# Patient Record
Sex: Female | Born: 1935 | Race: White | Hispanic: No | Smoking: Never smoker
Health system: Southern US, Community
[De-identification: ages and names within clinical notes are randomized; demographics above are authoritative.]

## PROBLEM LIST (undated history)

## (undated) DIAGNOSIS — I6529 Occlusion and stenosis of unspecified carotid artery: Secondary | ICD-10-CM

## (undated) DIAGNOSIS — D649 Anemia, unspecified: Secondary | ICD-10-CM

## (undated) DIAGNOSIS — F32A Depression, unspecified: Secondary | ICD-10-CM

## (undated) DIAGNOSIS — G20 Parkinson's disease: Secondary | ICD-10-CM

## (undated) DIAGNOSIS — F329 Major depressive disorder, single episode, unspecified: Secondary | ICD-10-CM

## (undated) DIAGNOSIS — E041 Nontoxic single thyroid nodule: Secondary | ICD-10-CM

## (undated) DIAGNOSIS — E785 Hyperlipidemia, unspecified: Secondary | ICD-10-CM

## (undated) DIAGNOSIS — E119 Type 2 diabetes mellitus without complications: Secondary | ICD-10-CM

## (undated) DIAGNOSIS — K219 Gastro-esophageal reflux disease without esophagitis: Secondary | ICD-10-CM

## (undated) DIAGNOSIS — I10 Essential (primary) hypertension: Secondary | ICD-10-CM

## (undated) DIAGNOSIS — F0390 Unspecified dementia without behavioral disturbance: Secondary | ICD-10-CM

## (undated) HISTORY — PX: PARATHYROIDECTOMY: SHX19

## (undated) HISTORY — PX: APPENDECTOMY: SHX54

## (undated) HISTORY — PX: ABDOMINAL HYSTERECTOMY: SHX81

## (undated) HISTORY — PX: JOINT REPLACEMENT: SHX530

## (undated) HISTORY — PX: CAROTID ENDARTERECTOMY: SUR193

---

## 2006-01-15 ENCOUNTER — Other Ambulatory Visit: Payer: Self-pay

## 2006-01-15 ENCOUNTER — Emergency Department: Payer: Self-pay | Admitting: Emergency Medicine

## 2008-07-25 ENCOUNTER — Ambulatory Visit: Payer: Self-pay | Admitting: Internal Medicine

## 2008-10-02 ENCOUNTER — Ambulatory Visit: Payer: Self-pay | Admitting: Vascular Surgery

## 2008-10-30 ENCOUNTER — Ambulatory Visit: Payer: Self-pay | Admitting: Vascular Surgery

## 2008-11-06 ENCOUNTER — Inpatient Hospital Stay: Payer: Self-pay | Admitting: Vascular Surgery

## 2008-11-11 ENCOUNTER — Emergency Department: Payer: Self-pay | Admitting: Emergency Medicine

## 2009-03-24 ENCOUNTER — Ambulatory Visit: Payer: Self-pay | Admitting: Unknown Physician Specialty

## 2009-08-20 ENCOUNTER — Ambulatory Visit: Payer: Self-pay | Admitting: Internal Medicine

## 2009-09-24 ENCOUNTER — Ambulatory Visit: Payer: Self-pay | Admitting: Internal Medicine

## 2009-10-05 ENCOUNTER — Ambulatory Visit: Payer: Self-pay | Admitting: Anesthesiology

## 2009-12-17 ENCOUNTER — Ambulatory Visit: Payer: Self-pay | Admitting: Internal Medicine

## 2010-01-14 ENCOUNTER — Ambulatory Visit: Payer: Self-pay | Admitting: Unknown Physician Specialty

## 2010-01-29 ENCOUNTER — Ambulatory Visit: Payer: Self-pay | Admitting: Internal Medicine

## 2010-05-11 ENCOUNTER — Other Ambulatory Visit: Payer: Self-pay | Admitting: Internal Medicine

## 2010-12-03 ENCOUNTER — Ambulatory Visit: Payer: Self-pay

## 2011-04-16 DIAGNOSIS — R269 Unspecified abnormalities of gait and mobility: Secondary | ICD-10-CM | POA: Diagnosis not present

## 2011-04-16 DIAGNOSIS — Z9181 History of falling: Secondary | ICD-10-CM | POA: Diagnosis not present

## 2011-04-16 DIAGNOSIS — R279 Unspecified lack of coordination: Secondary | ICD-10-CM | POA: Diagnosis not present

## 2011-04-16 DIAGNOSIS — M6281 Muscle weakness (generalized): Secondary | ICD-10-CM | POA: Diagnosis not present

## 2011-04-16 DIAGNOSIS — M2550 Pain in unspecified joint: Secondary | ICD-10-CM | POA: Diagnosis not present

## 2011-04-19 DIAGNOSIS — R279 Unspecified lack of coordination: Secondary | ICD-10-CM | POA: Diagnosis not present

## 2011-04-19 DIAGNOSIS — M6281 Muscle weakness (generalized): Secondary | ICD-10-CM | POA: Diagnosis not present

## 2011-04-19 DIAGNOSIS — Z9181 History of falling: Secondary | ICD-10-CM | POA: Diagnosis not present

## 2011-04-19 DIAGNOSIS — M2550 Pain in unspecified joint: Secondary | ICD-10-CM | POA: Diagnosis not present

## 2011-04-19 DIAGNOSIS — R269 Unspecified abnormalities of gait and mobility: Secondary | ICD-10-CM | POA: Diagnosis not present

## 2011-04-20 DIAGNOSIS — N343 Urethral syndrome, unspecified: Secondary | ICD-10-CM | POA: Diagnosis not present

## 2011-04-20 DIAGNOSIS — R339 Retention of urine, unspecified: Secondary | ICD-10-CM | POA: Diagnosis not present

## 2011-04-20 DIAGNOSIS — R338 Other retention of urine: Secondary | ICD-10-CM | POA: Diagnosis not present

## 2011-04-22 ENCOUNTER — Ambulatory Visit: Payer: Self-pay | Admitting: Surgery

## 2011-04-22 DIAGNOSIS — I119 Hypertensive heart disease without heart failure: Secondary | ICD-10-CM | POA: Diagnosis not present

## 2011-04-22 DIAGNOSIS — Z01810 Encounter for preprocedural cardiovascular examination: Secondary | ICD-10-CM | POA: Diagnosis not present

## 2011-04-22 DIAGNOSIS — E559 Vitamin D deficiency, unspecified: Secondary | ICD-10-CM | POA: Diagnosis not present

## 2011-04-22 DIAGNOSIS — E119 Type 2 diabetes mellitus without complications: Secondary | ICD-10-CM | POA: Diagnosis not present

## 2011-04-22 DIAGNOSIS — Z79899 Other long term (current) drug therapy: Secondary | ICD-10-CM | POA: Diagnosis not present

## 2011-04-22 DIAGNOSIS — Z7982 Long term (current) use of aspirin: Secondary | ICD-10-CM | POA: Diagnosis not present

## 2011-04-22 DIAGNOSIS — I739 Peripheral vascular disease, unspecified: Secondary | ICD-10-CM | POA: Diagnosis not present

## 2011-04-22 DIAGNOSIS — Z01812 Encounter for preprocedural laboratory examination: Secondary | ICD-10-CM | POA: Diagnosis not present

## 2011-04-22 LAB — HEMOGLOBIN: HGB: 11 g/dL — ABNORMAL LOW (ref 12.0–16.0)

## 2011-04-22 LAB — BASIC METABOLIC PANEL
Calcium, Total: 10.4 mg/dL — ABNORMAL HIGH (ref 8.5–10.1)
Co2: 28 mmol/L (ref 21–32)
Creatinine: 0.75 mg/dL (ref 0.60–1.30)
EGFR (African American): >60
Sodium: 141 mmol/L (ref 136–145)

## 2011-04-23 DIAGNOSIS — M2550 Pain in unspecified joint: Secondary | ICD-10-CM | POA: Diagnosis not present

## 2011-04-23 DIAGNOSIS — M6281 Muscle weakness (generalized): Secondary | ICD-10-CM | POA: Diagnosis not present

## 2011-04-23 DIAGNOSIS — R279 Unspecified lack of coordination: Secondary | ICD-10-CM | POA: Diagnosis not present

## 2011-04-23 DIAGNOSIS — I6529 Occlusion and stenosis of unspecified carotid artery: Secondary | ICD-10-CM | POA: Diagnosis not present

## 2011-04-23 DIAGNOSIS — Z9181 History of falling: Secondary | ICD-10-CM | POA: Diagnosis not present

## 2011-04-23 DIAGNOSIS — R269 Unspecified abnormalities of gait and mobility: Secondary | ICD-10-CM | POA: Diagnosis not present

## 2011-04-26 DIAGNOSIS — Z9181 History of falling: Secondary | ICD-10-CM | POA: Diagnosis not present

## 2011-04-26 DIAGNOSIS — M6281 Muscle weakness (generalized): Secondary | ICD-10-CM | POA: Diagnosis not present

## 2011-04-26 DIAGNOSIS — R279 Unspecified lack of coordination: Secondary | ICD-10-CM | POA: Diagnosis not present

## 2011-04-26 DIAGNOSIS — M2550 Pain in unspecified joint: Secondary | ICD-10-CM | POA: Diagnosis not present

## 2011-04-26 DIAGNOSIS — R269 Unspecified abnormalities of gait and mobility: Secondary | ICD-10-CM | POA: Diagnosis not present

## 2011-04-28 DIAGNOSIS — M2550 Pain in unspecified joint: Secondary | ICD-10-CM | POA: Diagnosis not present

## 2011-04-28 DIAGNOSIS — R269 Unspecified abnormalities of gait and mobility: Secondary | ICD-10-CM | POA: Diagnosis not present

## 2011-04-28 DIAGNOSIS — R279 Unspecified lack of coordination: Secondary | ICD-10-CM | POA: Diagnosis not present

## 2011-04-28 DIAGNOSIS — M6281 Muscle weakness (generalized): Secondary | ICD-10-CM | POA: Diagnosis not present

## 2011-04-28 DIAGNOSIS — Z9181 History of falling: Secondary | ICD-10-CM | POA: Diagnosis not present

## 2011-05-04 DIAGNOSIS — M6281 Muscle weakness (generalized): Secondary | ICD-10-CM | POA: Diagnosis not present

## 2011-05-04 DIAGNOSIS — Z9181 History of falling: Secondary | ICD-10-CM | POA: Diagnosis not present

## 2011-05-04 DIAGNOSIS — R279 Unspecified lack of coordination: Secondary | ICD-10-CM | POA: Diagnosis not present

## 2011-05-04 DIAGNOSIS — M2550 Pain in unspecified joint: Secondary | ICD-10-CM | POA: Diagnosis not present

## 2011-05-04 DIAGNOSIS — R269 Unspecified abnormalities of gait and mobility: Secondary | ICD-10-CM | POA: Diagnosis not present

## 2011-05-06 DIAGNOSIS — Z9181 History of falling: Secondary | ICD-10-CM | POA: Diagnosis not present

## 2011-05-06 DIAGNOSIS — R279 Unspecified lack of coordination: Secondary | ICD-10-CM | POA: Diagnosis not present

## 2011-05-06 DIAGNOSIS — M6281 Muscle weakness (generalized): Secondary | ICD-10-CM | POA: Diagnosis not present

## 2011-05-06 DIAGNOSIS — R269 Unspecified abnormalities of gait and mobility: Secondary | ICD-10-CM | POA: Diagnosis not present

## 2011-05-06 DIAGNOSIS — M2550 Pain in unspecified joint: Secondary | ICD-10-CM | POA: Diagnosis not present

## 2011-05-07 ENCOUNTER — Ambulatory Visit: Payer: Self-pay | Admitting: Surgery

## 2011-05-07 DIAGNOSIS — Z7902 Long term (current) use of antithrombotics/antiplatelets: Secondary | ICD-10-CM | POA: Diagnosis not present

## 2011-05-07 DIAGNOSIS — Z7982 Long term (current) use of aspirin: Secondary | ICD-10-CM | POA: Diagnosis not present

## 2011-05-07 DIAGNOSIS — E049 Nontoxic goiter, unspecified: Secondary | ICD-10-CM | POA: Diagnosis not present

## 2011-05-07 DIAGNOSIS — E210 Primary hyperparathyroidism: Secondary | ICD-10-CM | POA: Diagnosis not present

## 2011-05-07 DIAGNOSIS — K219 Gastro-esophageal reflux disease without esophagitis: Secondary | ICD-10-CM | POA: Diagnosis not present

## 2011-05-07 DIAGNOSIS — D351 Benign neoplasm of parathyroid gland: Secondary | ICD-10-CM | POA: Diagnosis not present

## 2011-05-07 DIAGNOSIS — R0609 Other forms of dyspnea: Secondary | ICD-10-CM | POA: Diagnosis not present

## 2011-05-07 DIAGNOSIS — R011 Cardiac murmur, unspecified: Secondary | ICD-10-CM | POA: Diagnosis not present

## 2011-05-07 DIAGNOSIS — Z79899 Other long term (current) drug therapy: Secondary | ICD-10-CM | POA: Diagnosis not present

## 2011-05-07 DIAGNOSIS — K589 Irritable bowel syndrome without diarrhea: Secondary | ICD-10-CM | POA: Diagnosis not present

## 2011-05-07 DIAGNOSIS — E119 Type 2 diabetes mellitus without complications: Secondary | ICD-10-CM | POA: Diagnosis not present

## 2011-05-07 DIAGNOSIS — R609 Edema, unspecified: Secondary | ICD-10-CM | POA: Diagnosis not present

## 2011-05-07 DIAGNOSIS — M129 Arthropathy, unspecified: Secondary | ICD-10-CM | POA: Diagnosis not present

## 2011-05-07 DIAGNOSIS — R339 Retention of urine, unspecified: Secondary | ICD-10-CM | POA: Diagnosis not present

## 2011-05-07 DIAGNOSIS — I70209 Unspecified atherosclerosis of native arteries of extremities, unspecified extremity: Secondary | ICD-10-CM | POA: Diagnosis not present

## 2011-05-07 DIAGNOSIS — I10 Essential (primary) hypertension: Secondary | ICD-10-CM | POA: Diagnosis not present

## 2011-05-09 ENCOUNTER — Other Ambulatory Visit: Payer: Self-pay | Admitting: Surgery

## 2011-05-09 DIAGNOSIS — E213 Hyperparathyroidism, unspecified: Secondary | ICD-10-CM | POA: Diagnosis not present

## 2011-05-09 LAB — CALCIUM: Calcium, Total: 10.8 mg/dL — ABNORMAL HIGH (ref 8.5–10.1)

## 2011-05-10 LAB — PATHOLOGY REPORT

## 2011-05-13 ENCOUNTER — Other Ambulatory Visit: Payer: Self-pay | Admitting: Surgery

## 2011-05-13 DIAGNOSIS — E210 Primary hyperparathyroidism: Secondary | ICD-10-CM | POA: Diagnosis not present

## 2011-05-13 DIAGNOSIS — Z9889 Other specified postprocedural states: Secondary | ICD-10-CM | POA: Diagnosis not present

## 2011-05-13 LAB — CALCIUM: Calcium, Total: 11 mg/dL — ABNORMAL HIGH (ref 8.5–10.1)

## 2011-05-18 ENCOUNTER — Ambulatory Visit: Payer: Self-pay | Admitting: Surgery

## 2011-05-18 DIAGNOSIS — E210 Primary hyperparathyroidism: Secondary | ICD-10-CM | POA: Diagnosis not present

## 2011-05-18 DIAGNOSIS — M6281 Muscle weakness (generalized): Secondary | ICD-10-CM | POA: Diagnosis not present

## 2011-05-18 DIAGNOSIS — E213 Hyperparathyroidism, unspecified: Secondary | ICD-10-CM | POA: Diagnosis not present

## 2011-05-18 DIAGNOSIS — R279 Unspecified lack of coordination: Secondary | ICD-10-CM | POA: Diagnosis not present

## 2011-05-18 DIAGNOSIS — Z9181 History of falling: Secondary | ICD-10-CM | POA: Diagnosis not present

## 2011-05-18 DIAGNOSIS — M2550 Pain in unspecified joint: Secondary | ICD-10-CM | POA: Diagnosis not present

## 2011-05-18 DIAGNOSIS — R269 Unspecified abnormalities of gait and mobility: Secondary | ICD-10-CM | POA: Diagnosis not present

## 2011-05-18 DIAGNOSIS — E211 Secondary hyperparathyroidism, not elsewhere classified: Secondary | ICD-10-CM | POA: Diagnosis not present

## 2011-05-18 LAB — CREATININE, SERUM
Creatinine: 0.84 mg/dL (ref 0.60–1.30)
EGFR (African American): >60

## 2011-05-26 DIAGNOSIS — E213 Hyperparathyroidism, unspecified: Secondary | ICD-10-CM | POA: Diagnosis not present

## 2011-05-26 DIAGNOSIS — E0590 Thyrotoxicosis, unspecified without thyrotoxic crisis or storm: Secondary | ICD-10-CM | POA: Diagnosis not present

## 2011-06-01 ENCOUNTER — Ambulatory Visit: Payer: Self-pay

## 2011-06-01 DIAGNOSIS — E049 Nontoxic goiter, unspecified: Secondary | ICD-10-CM | POA: Diagnosis not present

## 2011-06-01 DIAGNOSIS — J90 Pleural effusion, not elsewhere classified: Secondary | ICD-10-CM | POA: Diagnosis not present

## 2011-06-08 DIAGNOSIS — E213 Hyperparathyroidism, unspecified: Secondary | ICD-10-CM | POA: Diagnosis not present

## 2011-06-08 DIAGNOSIS — M810 Age-related osteoporosis without current pathological fracture: Secondary | ICD-10-CM | POA: Diagnosis not present

## 2011-06-08 DIAGNOSIS — E0590 Thyrotoxicosis, unspecified without thyrotoxic crisis or storm: Secondary | ICD-10-CM | POA: Diagnosis not present

## 2011-06-15 DIAGNOSIS — Z9181 History of falling: Secondary | ICD-10-CM | POA: Diagnosis not present

## 2011-06-15 DIAGNOSIS — K589 Irritable bowel syndrome without diarrhea: Secondary | ICD-10-CM | POA: Diagnosis not present

## 2011-06-15 DIAGNOSIS — M2550 Pain in unspecified joint: Secondary | ICD-10-CM | POA: Diagnosis not present

## 2011-06-15 DIAGNOSIS — M6281 Muscle weakness (generalized): Secondary | ICD-10-CM | POA: Diagnosis not present

## 2011-06-15 DIAGNOSIS — E213 Hyperparathyroidism, unspecified: Secondary | ICD-10-CM | POA: Diagnosis not present

## 2011-06-15 DIAGNOSIS — R279 Unspecified lack of coordination: Secondary | ICD-10-CM | POA: Diagnosis not present

## 2011-06-15 DIAGNOSIS — F0390 Unspecified dementia without behavioral disturbance: Secondary | ICD-10-CM | POA: Diagnosis not present

## 2011-06-15 DIAGNOSIS — E119 Type 2 diabetes mellitus without complications: Secondary | ICD-10-CM | POA: Diagnosis not present

## 2011-06-15 DIAGNOSIS — R339 Retention of urine, unspecified: Secondary | ICD-10-CM | POA: Diagnosis not present

## 2011-06-15 DIAGNOSIS — M159 Polyosteoarthritis, unspecified: Secondary | ICD-10-CM | POA: Diagnosis not present

## 2011-06-15 DIAGNOSIS — I10 Essential (primary) hypertension: Secondary | ICD-10-CM | POA: Diagnosis not present

## 2011-06-15 DIAGNOSIS — R269 Unspecified abnormalities of gait and mobility: Secondary | ICD-10-CM | POA: Diagnosis not present

## 2011-06-17 DIAGNOSIS — Z9181 History of falling: Secondary | ICD-10-CM | POA: Diagnosis not present

## 2011-06-17 DIAGNOSIS — R338 Other retention of urine: Secondary | ICD-10-CM | POA: Diagnosis not present

## 2011-06-17 DIAGNOSIS — M6281 Muscle weakness (generalized): Secondary | ICD-10-CM | POA: Diagnosis not present

## 2011-06-17 DIAGNOSIS — R339 Retention of urine, unspecified: Secondary | ICD-10-CM | POA: Diagnosis not present

## 2011-06-17 DIAGNOSIS — E119 Type 2 diabetes mellitus without complications: Secondary | ICD-10-CM | POA: Diagnosis not present

## 2011-06-17 DIAGNOSIS — R279 Unspecified lack of coordination: Secondary | ICD-10-CM | POA: Diagnosis not present

## 2011-06-17 DIAGNOSIS — M2550 Pain in unspecified joint: Secondary | ICD-10-CM | POA: Diagnosis not present

## 2011-06-17 DIAGNOSIS — N343 Urethral syndrome, unspecified: Secondary | ICD-10-CM | POA: Diagnosis not present

## 2011-06-17 DIAGNOSIS — I6529 Occlusion and stenosis of unspecified carotid artery: Secondary | ICD-10-CM | POA: Diagnosis not present

## 2011-06-17 DIAGNOSIS — R269 Unspecified abnormalities of gait and mobility: Secondary | ICD-10-CM | POA: Diagnosis not present

## 2011-06-22 DIAGNOSIS — R269 Unspecified abnormalities of gait and mobility: Secondary | ICD-10-CM | POA: Diagnosis not present

## 2011-06-22 DIAGNOSIS — M6281 Muscle weakness (generalized): Secondary | ICD-10-CM | POA: Diagnosis not present

## 2011-06-22 DIAGNOSIS — M2550 Pain in unspecified joint: Secondary | ICD-10-CM | POA: Diagnosis not present

## 2011-06-22 DIAGNOSIS — R279 Unspecified lack of coordination: Secondary | ICD-10-CM | POA: Diagnosis not present

## 2011-06-22 DIAGNOSIS — Z9181 History of falling: Secondary | ICD-10-CM | POA: Diagnosis not present

## 2011-06-24 DIAGNOSIS — Z9181 History of falling: Secondary | ICD-10-CM | POA: Diagnosis not present

## 2011-06-24 DIAGNOSIS — M2550 Pain in unspecified joint: Secondary | ICD-10-CM | POA: Diagnosis not present

## 2011-06-24 DIAGNOSIS — R269 Unspecified abnormalities of gait and mobility: Secondary | ICD-10-CM | POA: Diagnosis not present

## 2011-06-24 DIAGNOSIS — M6281 Muscle weakness (generalized): Secondary | ICD-10-CM | POA: Diagnosis not present

## 2011-06-24 DIAGNOSIS — R279 Unspecified lack of coordination: Secondary | ICD-10-CM | POA: Diagnosis not present

## 2011-06-29 DIAGNOSIS — M2550 Pain in unspecified joint: Secondary | ICD-10-CM | POA: Diagnosis not present

## 2011-06-29 DIAGNOSIS — M6281 Muscle weakness (generalized): Secondary | ICD-10-CM | POA: Diagnosis not present

## 2011-06-29 DIAGNOSIS — Z9181 History of falling: Secondary | ICD-10-CM | POA: Diagnosis not present

## 2011-06-29 DIAGNOSIS — R269 Unspecified abnormalities of gait and mobility: Secondary | ICD-10-CM | POA: Diagnosis not present

## 2011-06-29 DIAGNOSIS — R279 Unspecified lack of coordination: Secondary | ICD-10-CM | POA: Diagnosis not present

## 2011-07-06 DIAGNOSIS — R279 Unspecified lack of coordination: Secondary | ICD-10-CM | POA: Diagnosis not present

## 2011-07-06 DIAGNOSIS — M2550 Pain in unspecified joint: Secondary | ICD-10-CM | POA: Diagnosis not present

## 2011-07-06 DIAGNOSIS — M6281 Muscle weakness (generalized): Secondary | ICD-10-CM | POA: Diagnosis not present

## 2011-07-06 DIAGNOSIS — R269 Unspecified abnormalities of gait and mobility: Secondary | ICD-10-CM | POA: Diagnosis not present

## 2011-07-06 DIAGNOSIS — Z9181 History of falling: Secondary | ICD-10-CM | POA: Diagnosis not present

## 2011-07-08 DIAGNOSIS — M2550 Pain in unspecified joint: Secondary | ICD-10-CM | POA: Diagnosis not present

## 2011-07-08 DIAGNOSIS — M6281 Muscle weakness (generalized): Secondary | ICD-10-CM | POA: Diagnosis not present

## 2011-07-08 DIAGNOSIS — R279 Unspecified lack of coordination: Secondary | ICD-10-CM | POA: Diagnosis not present

## 2011-07-08 DIAGNOSIS — Z9181 History of falling: Secondary | ICD-10-CM | POA: Diagnosis not present

## 2011-07-08 DIAGNOSIS — R269 Unspecified abnormalities of gait and mobility: Secondary | ICD-10-CM | POA: Diagnosis not present

## 2011-07-13 DIAGNOSIS — M1710 Unilateral primary osteoarthritis, unspecified knee: Secondary | ICD-10-CM | POA: Diagnosis not present

## 2011-07-14 DIAGNOSIS — R269 Unspecified abnormalities of gait and mobility: Secondary | ICD-10-CM | POA: Diagnosis not present

## 2011-07-14 DIAGNOSIS — Z9181 History of falling: Secondary | ICD-10-CM | POA: Diagnosis not present

## 2011-07-14 DIAGNOSIS — R279 Unspecified lack of coordination: Secondary | ICD-10-CM | POA: Diagnosis not present

## 2011-07-14 DIAGNOSIS — M2550 Pain in unspecified joint: Secondary | ICD-10-CM | POA: Diagnosis not present

## 2011-07-14 DIAGNOSIS — M6281 Muscle weakness (generalized): Secondary | ICD-10-CM | POA: Diagnosis not present

## 2011-07-15 DIAGNOSIS — M6281 Muscle weakness (generalized): Secondary | ICD-10-CM | POA: Diagnosis not present

## 2011-07-15 DIAGNOSIS — R279 Unspecified lack of coordination: Secondary | ICD-10-CM | POA: Diagnosis not present

## 2011-07-15 DIAGNOSIS — R269 Unspecified abnormalities of gait and mobility: Secondary | ICD-10-CM | POA: Diagnosis not present

## 2011-07-15 DIAGNOSIS — Z9181 History of falling: Secondary | ICD-10-CM | POA: Diagnosis not present

## 2011-07-15 DIAGNOSIS — M2550 Pain in unspecified joint: Secondary | ICD-10-CM | POA: Diagnosis not present

## 2011-07-22 DIAGNOSIS — R0609 Other forms of dyspnea: Secondary | ICD-10-CM | POA: Diagnosis not present

## 2011-07-22 DIAGNOSIS — R0989 Other specified symptoms and signs involving the circulatory and respiratory systems: Secondary | ICD-10-CM | POA: Diagnosis not present

## 2011-07-22 DIAGNOSIS — I209 Angina pectoris, unspecified: Secondary | ICD-10-CM | POA: Diagnosis not present

## 2011-08-06 DIAGNOSIS — I10 Essential (primary) hypertension: Secondary | ICD-10-CM | POA: Diagnosis not present

## 2011-08-06 DIAGNOSIS — R1084 Generalized abdominal pain: Secondary | ICD-10-CM | POA: Diagnosis not present

## 2011-08-06 DIAGNOSIS — M47817 Spondylosis without myelopathy or radiculopathy, lumbosacral region: Secondary | ICD-10-CM | POA: Diagnosis not present

## 2011-08-06 DIAGNOSIS — K589 Irritable bowel syndrome without diarrhea: Secondary | ICD-10-CM | POA: Diagnosis not present

## 2011-08-06 DIAGNOSIS — K529 Noninfective gastroenteritis and colitis, unspecified: Secondary | ICD-10-CM | POA: Diagnosis not present

## 2011-08-06 DIAGNOSIS — R197 Diarrhea, unspecified: Secondary | ICD-10-CM | POA: Diagnosis not present

## 2011-08-06 DIAGNOSIS — M47812 Spondylosis without myelopathy or radiculopathy, cervical region: Secondary | ICD-10-CM | POA: Diagnosis not present

## 2011-08-10 DIAGNOSIS — H2510 Age-related nuclear cataract, unspecified eye: Secondary | ICD-10-CM | POA: Diagnosis not present

## 2011-08-11 ENCOUNTER — Other Ambulatory Visit: Payer: Self-pay | Admitting: Internal Medicine

## 2011-08-11 DIAGNOSIS — Z1389 Encounter for screening for other disorder: Secondary | ICD-10-CM | POA: Diagnosis not present

## 2011-08-11 LAB — CREATININE, SERUM
EGFR (African American): >60
EGFR (Non-African Amer.): >60

## 2011-08-12 ENCOUNTER — Ambulatory Visit: Payer: Self-pay | Admitting: Internal Medicine

## 2011-08-12 DIAGNOSIS — R1084 Generalized abdominal pain: Secondary | ICD-10-CM | POA: Diagnosis not present

## 2011-08-12 DIAGNOSIS — R141 Gas pain: Secondary | ICD-10-CM | POA: Diagnosis not present

## 2011-08-12 DIAGNOSIS — R142 Eructation: Secondary | ICD-10-CM | POA: Diagnosis not present

## 2011-08-19 DIAGNOSIS — K589 Irritable bowel syndrome without diarrhea: Secondary | ICD-10-CM | POA: Diagnosis not present

## 2011-08-19 DIAGNOSIS — K219 Gastro-esophageal reflux disease without esophagitis: Secondary | ICD-10-CM | POA: Diagnosis not present

## 2011-08-19 DIAGNOSIS — K5900 Constipation, unspecified: Secondary | ICD-10-CM | POA: Diagnosis not present

## 2011-08-19 DIAGNOSIS — M47817 Spondylosis without myelopathy or radiculopathy, lumbosacral region: Secondary | ICD-10-CM | POA: Diagnosis not present

## 2011-08-19 DIAGNOSIS — K529 Noninfective gastroenteritis and colitis, unspecified: Secondary | ICD-10-CM | POA: Diagnosis not present

## 2011-08-19 DIAGNOSIS — I10 Essential (primary) hypertension: Secondary | ICD-10-CM | POA: Diagnosis not present

## 2011-09-01 DIAGNOSIS — H259 Unspecified age-related cataract: Secondary | ICD-10-CM | POA: Diagnosis not present

## 2011-09-01 DIAGNOSIS — H01009 Unspecified blepharitis unspecified eye, unspecified eyelid: Secondary | ICD-10-CM | POA: Diagnosis not present

## 2011-09-01 DIAGNOSIS — E119 Type 2 diabetes mellitus without complications: Secondary | ICD-10-CM | POA: Diagnosis not present

## 2011-09-01 DIAGNOSIS — H04129 Dry eye syndrome of unspecified lacrimal gland: Secondary | ICD-10-CM | POA: Diagnosis not present

## 2011-09-14 DIAGNOSIS — I10 Essential (primary) hypertension: Secondary | ICD-10-CM | POA: Diagnosis not present

## 2011-09-14 DIAGNOSIS — R5383 Other fatigue: Secondary | ICD-10-CM | POA: Diagnosis not present

## 2011-09-14 DIAGNOSIS — M48061 Spinal stenosis, lumbar region without neurogenic claudication: Secondary | ICD-10-CM | POA: Diagnosis not present

## 2011-09-14 DIAGNOSIS — K589 Irritable bowel syndrome without diarrhea: Secondary | ICD-10-CM | POA: Diagnosis not present

## 2011-09-14 DIAGNOSIS — E782 Mixed hyperlipidemia: Secondary | ICD-10-CM | POA: Diagnosis not present

## 2011-09-22 DIAGNOSIS — R339 Retention of urine, unspecified: Secondary | ICD-10-CM | POA: Diagnosis not present

## 2011-09-28 DIAGNOSIS — I10 Essential (primary) hypertension: Secondary | ICD-10-CM | POA: Diagnosis not present

## 2011-09-28 DIAGNOSIS — E213 Hyperparathyroidism, unspecified: Secondary | ICD-10-CM | POA: Diagnosis not present

## 2011-09-28 DIAGNOSIS — E0520 Thyrotoxicosis with toxic multinodular goiter without thyrotoxic crisis or storm: Secondary | ICD-10-CM | POA: Diagnosis not present

## 2011-10-05 DIAGNOSIS — K5900 Constipation, unspecified: Secondary | ICD-10-CM | POA: Diagnosis not present

## 2011-10-05 DIAGNOSIS — D649 Anemia, unspecified: Secondary | ICD-10-CM | POA: Diagnosis not present

## 2011-10-12 DIAGNOSIS — K5900 Constipation, unspecified: Secondary | ICD-10-CM | POA: Diagnosis not present

## 2011-10-12 DIAGNOSIS — D649 Anemia, unspecified: Secondary | ICD-10-CM | POA: Diagnosis not present

## 2011-11-09 DIAGNOSIS — H269 Unspecified cataract: Secondary | ICD-10-CM | POA: Diagnosis not present

## 2011-11-09 DIAGNOSIS — H268 Other specified cataract: Secondary | ICD-10-CM | POA: Diagnosis not present

## 2011-11-09 DIAGNOSIS — Z79899 Other long term (current) drug therapy: Secondary | ICD-10-CM | POA: Diagnosis not present

## 2011-11-09 HISTORY — PX: EYE SURGERY: SHX253

## 2011-11-23 DIAGNOSIS — D518 Other vitamin B12 deficiency anemias: Secondary | ICD-10-CM | POA: Diagnosis not present

## 2011-11-23 DIAGNOSIS — M47817 Spondylosis without myelopathy or radiculopathy, lumbosacral region: Secondary | ICD-10-CM | POA: Diagnosis not present

## 2011-11-23 DIAGNOSIS — R5381 Other malaise: Secondary | ICD-10-CM | POA: Diagnosis not present

## 2011-11-23 DIAGNOSIS — F0390 Unspecified dementia without behavioral disturbance: Secondary | ICD-10-CM | POA: Diagnosis not present

## 2011-11-23 DIAGNOSIS — K529 Noninfective gastroenteritis and colitis, unspecified: Secondary | ICD-10-CM | POA: Diagnosis not present

## 2011-11-23 DIAGNOSIS — I10 Essential (primary) hypertension: Secondary | ICD-10-CM | POA: Diagnosis not present

## 2011-12-22 DIAGNOSIS — N3946 Mixed incontinence: Secondary | ICD-10-CM | POA: Diagnosis not present

## 2011-12-23 DIAGNOSIS — D518 Other vitamin B12 deficiency anemias: Secondary | ICD-10-CM | POA: Diagnosis not present

## 2012-01-28 DIAGNOSIS — D518 Other vitamin B12 deficiency anemias: Secondary | ICD-10-CM | POA: Diagnosis not present

## 2012-02-22 DIAGNOSIS — I10 Essential (primary) hypertension: Secondary | ICD-10-CM | POA: Diagnosis not present

## 2012-02-22 DIAGNOSIS — D518 Other vitamin B12 deficiency anemias: Secondary | ICD-10-CM | POA: Diagnosis not present

## 2012-02-22 DIAGNOSIS — IMO0001 Reserved for inherently not codable concepts without codable children: Secondary | ICD-10-CM | POA: Diagnosis not present

## 2012-02-22 DIAGNOSIS — Z23 Encounter for immunization: Secondary | ICD-10-CM | POA: Diagnosis not present

## 2012-02-22 DIAGNOSIS — I050 Rheumatic mitral stenosis: Secondary | ICD-10-CM | POA: Diagnosis not present

## 2012-02-22 DIAGNOSIS — R5383 Other fatigue: Secondary | ICD-10-CM | POA: Diagnosis not present

## 2012-02-22 DIAGNOSIS — K219 Gastro-esophageal reflux disease without esophagitis: Secondary | ICD-10-CM | POA: Diagnosis not present

## 2012-02-22 DIAGNOSIS — E038 Other specified hypothyroidism: Secondary | ICD-10-CM | POA: Diagnosis not present

## 2012-02-22 DIAGNOSIS — R5381 Other malaise: Secondary | ICD-10-CM | POA: Diagnosis not present

## 2012-02-22 DIAGNOSIS — E782 Mixed hyperlipidemia: Secondary | ICD-10-CM | POA: Diagnosis not present

## 2012-02-22 DIAGNOSIS — E119 Type 2 diabetes mellitus without complications: Secondary | ICD-10-CM | POA: Diagnosis not present

## 2012-02-24 DIAGNOSIS — M1710 Unilateral primary osteoarthritis, unspecified knee: Secondary | ICD-10-CM | POA: Diagnosis not present

## 2012-04-12 HISTORY — PX: JOINT REPLACEMENT: SHX530

## 2012-06-13 DIAGNOSIS — M1710 Unilateral primary osteoarthritis, unspecified knee: Secondary | ICD-10-CM | POA: Diagnosis not present

## 2012-07-03 ENCOUNTER — Ambulatory Visit: Payer: Self-pay | Admitting: General Practice

## 2012-07-03 DIAGNOSIS — I119 Hypertensive heart disease without heart failure: Secondary | ICD-10-CM | POA: Diagnosis not present

## 2012-07-03 DIAGNOSIS — IMO0002 Reserved for concepts with insufficient information to code with codable children: Secondary | ICD-10-CM | POA: Diagnosis not present

## 2012-07-03 DIAGNOSIS — Z01810 Encounter for preprocedural cardiovascular examination: Secondary | ICD-10-CM | POA: Diagnosis not present

## 2012-07-03 DIAGNOSIS — Z01812 Encounter for preprocedural laboratory examination: Secondary | ICD-10-CM | POA: Diagnosis not present

## 2012-07-03 LAB — URINALYSIS, COMPLETE
Bilirubin,UR: NEGATIVE
Blood: NEGATIVE
Glucose,UR: NEGATIVE mg/dL (ref 0–75)
Leukocyte Esterase: NEGATIVE
RBC,UR: NONE SEEN /HPF (ref 0–5)
Squamous Epithelial: 1

## 2012-07-03 LAB — CBC
HCT: 34.4 % — ABNORMAL LOW (ref 35.0–47.0)
HGB: 11.7 g/dL — ABNORMAL LOW (ref 12.0–16.0)
MCHC: 33.9 g/dL (ref 32.0–36.0)
Platelet: 251 10*3/uL (ref 150–440)
RBC: 3.9 10*6/uL (ref 3.80–5.20)
RDW: 14.3 % (ref 11.5–14.5)
WBC: 8.5 10*3/uL (ref 3.6–11.0)

## 2012-07-03 LAB — SEDIMENTATION RATE: Erythrocyte Sed Rate: 31 mm/hr — ABNORMAL HIGH (ref 0–30)

## 2012-07-03 LAB — BASIC METABOLIC PANEL
Anion Gap: 5 — ABNORMAL LOW (ref 7–16)
BUN: 15 mg/dL (ref 7–18)
Creatinine: 0.55 mg/dL — ABNORMAL LOW (ref 0.60–1.30)
EGFR (African American): >60
Potassium: 3.7 mmol/L (ref 3.5–5.1)

## 2012-07-03 LAB — MRSA PCR SCREENING

## 2012-07-03 LAB — PROTIME-INR: Prothrombin Time: 13.5 secs (ref 11.5–14.7)

## 2012-07-03 LAB — APTT: Activated PTT: 27.3 secs (ref 23.6–35.9)

## 2012-07-04 LAB — URINE CULTURE

## 2012-07-27 DIAGNOSIS — M159 Polyosteoarthritis, unspecified: Secondary | ICD-10-CM | POA: Diagnosis not present

## 2012-07-27 DIAGNOSIS — I6529 Occlusion and stenosis of unspecified carotid artery: Secondary | ICD-10-CM | POA: Diagnosis not present

## 2012-07-27 DIAGNOSIS — I10 Essential (primary) hypertension: Secondary | ICD-10-CM | POA: Diagnosis not present

## 2012-07-27 DIAGNOSIS — I059 Rheumatic mitral valve disease, unspecified: Secondary | ICD-10-CM | POA: Diagnosis not present

## 2012-07-27 DIAGNOSIS — E782 Mixed hyperlipidemia: Secondary | ICD-10-CM | POA: Diagnosis not present

## 2012-07-27 DIAGNOSIS — R5383 Other fatigue: Secondary | ICD-10-CM | POA: Diagnosis not present

## 2012-07-27 DIAGNOSIS — I050 Rheumatic mitral stenosis: Secondary | ICD-10-CM | POA: Diagnosis not present

## 2012-07-27 DIAGNOSIS — F0390 Unspecified dementia without behavioral disturbance: Secondary | ICD-10-CM | POA: Diagnosis not present

## 2012-07-27 DIAGNOSIS — M1710 Unilateral primary osteoarthritis, unspecified knee: Secondary | ICD-10-CM | POA: Diagnosis not present

## 2012-07-27 DIAGNOSIS — R5381 Other malaise: Secondary | ICD-10-CM | POA: Diagnosis not present

## 2012-07-27 DIAGNOSIS — E119 Type 2 diabetes mellitus without complications: Secondary | ICD-10-CM | POA: Diagnosis not present

## 2012-08-01 ENCOUNTER — Ambulatory Visit: Payer: Self-pay | Admitting: General Practice

## 2012-08-01 DIAGNOSIS — M79609 Pain in unspecified limb: Secondary | ICD-10-CM | POA: Diagnosis not present

## 2012-08-01 DIAGNOSIS — Z01812 Encounter for preprocedural laboratory examination: Secondary | ICD-10-CM | POA: Diagnosis not present

## 2012-08-01 DIAGNOSIS — IMO0002 Reserved for concepts with insufficient information to code with codable children: Secondary | ICD-10-CM | POA: Diagnosis not present

## 2012-08-01 LAB — BASIC METABOLIC PANEL
Anion Gap: 4 — ABNORMAL LOW (ref 7–16)
BUN: 17 mg/dL (ref 7–18)
Chloride: 110 mmol/L — ABNORMAL HIGH (ref 98–107)
Co2: 28 mmol/L (ref 21–32)
Creatinine: 0.6 mg/dL (ref 0.60–1.30)
EGFR (Non-African Amer.): >60
Glucose: 125 mg/dL — ABNORMAL HIGH (ref 65–99)
Osmolality: 286 (ref 275–301)
Potassium: 4 mmol/L (ref 3.5–5.1)
Sodium: 142 mmol/L (ref 136–145)

## 2012-08-01 LAB — CBC
HCT: 32.2 % — ABNORMAL LOW (ref 35.0–47.0)
MCV: 89 fL (ref 80–100)
Platelet: 223 10*3/uL (ref 150–440)
RBC: 3.61 10*6/uL — ABNORMAL LOW (ref 3.80–5.20)
RDW: 14.2 % (ref 11.5–14.5)

## 2012-08-01 LAB — SEDIMENTATION RATE: Erythrocyte Sed Rate: 35 mm/hr — ABNORMAL HIGH (ref 0–30)

## 2012-08-03 ENCOUNTER — Ambulatory Visit: Payer: Self-pay | Admitting: General Practice

## 2012-08-03 DIAGNOSIS — Z01812 Encounter for preprocedural laboratory examination: Secondary | ICD-10-CM | POA: Diagnosis not present

## 2012-08-03 DIAGNOSIS — M79609 Pain in unspecified limb: Secondary | ICD-10-CM | POA: Diagnosis not present

## 2012-08-03 DIAGNOSIS — Z79899 Other long term (current) drug therapy: Secondary | ICD-10-CM | POA: Diagnosis not present

## 2012-08-03 DIAGNOSIS — M1710 Unilateral primary osteoarthritis, unspecified knee: Secondary | ICD-10-CM | POA: Diagnosis not present

## 2012-08-03 LAB — URINALYSIS, COMPLETE
Bacteria: NONE SEEN
Blood: NEGATIVE
Glucose,UR: NEGATIVE mg/dL (ref 0–75)
Hyaline Cast: 14
Ketone: NEGATIVE
Leukocyte Esterase: NEGATIVE
Nitrite: NEGATIVE
Protein: NEGATIVE
RBC,UR: <1 /HPF (ref 0–5)
Specific Gravity: 1.011 (ref 1.003–1.030)
Squamous Epithelial: <1
WBC UR: 2 /HPF (ref 0–5)

## 2012-08-03 LAB — PROTIME-INR
INR: 1
Prothrombin Time: 13 secs (ref 11.5–14.7)

## 2012-08-03 LAB — APTT: Activated PTT: 27.4 secs (ref 23.6–35.9)

## 2012-08-04 LAB — URINE CULTURE

## 2012-08-18 DIAGNOSIS — R011 Cardiac murmur, unspecified: Secondary | ICD-10-CM | POA: Diagnosis not present

## 2012-08-19 DIAGNOSIS — H2510 Age-related nuclear cataract, unspecified eye: Secondary | ICD-10-CM | POA: Diagnosis not present

## 2012-08-25 DIAGNOSIS — I6529 Occlusion and stenosis of unspecified carotid artery: Secondary | ICD-10-CM | POA: Diagnosis not present

## 2012-08-31 DIAGNOSIS — K529 Noninfective gastroenteritis and colitis, unspecified: Secondary | ICD-10-CM | POA: Diagnosis not present

## 2012-08-31 DIAGNOSIS — R1084 Generalized abdominal pain: Secondary | ICD-10-CM | POA: Diagnosis not present

## 2012-08-31 DIAGNOSIS — R197 Diarrhea, unspecified: Secondary | ICD-10-CM | POA: Diagnosis not present

## 2012-08-31 DIAGNOSIS — I10 Essential (primary) hypertension: Secondary | ICD-10-CM | POA: Diagnosis not present

## 2012-08-31 DIAGNOSIS — E119 Type 2 diabetes mellitus without complications: Secondary | ICD-10-CM | POA: Diagnosis not present

## 2012-08-31 DIAGNOSIS — F0390 Unspecified dementia without behavioral disturbance: Secondary | ICD-10-CM | POA: Diagnosis not present

## 2012-09-12 ENCOUNTER — Other Ambulatory Visit: Payer: Self-pay

## 2012-09-12 DIAGNOSIS — R197 Diarrhea, unspecified: Secondary | ICD-10-CM | POA: Diagnosis not present

## 2012-09-13 LAB — CLOSTRIDIUM DIFFICILE BY PCR

## 2012-09-14 DIAGNOSIS — R197 Diarrhea, unspecified: Secondary | ICD-10-CM | POA: Diagnosis not present

## 2012-09-14 DIAGNOSIS — I10 Essential (primary) hypertension: Secondary | ICD-10-CM | POA: Diagnosis not present

## 2012-09-14 DIAGNOSIS — E119 Type 2 diabetes mellitus without complications: Secondary | ICD-10-CM | POA: Diagnosis not present

## 2012-09-14 DIAGNOSIS — R5381 Other malaise: Secondary | ICD-10-CM | POA: Diagnosis not present

## 2012-09-14 DIAGNOSIS — K529 Noninfective gastroenteritis and colitis, unspecified: Secondary | ICD-10-CM | POA: Diagnosis not present

## 2012-09-14 DIAGNOSIS — R5383 Other fatigue: Secondary | ICD-10-CM | POA: Diagnosis not present

## 2012-09-14 DIAGNOSIS — E038 Other specified hypothyroidism: Secondary | ICD-10-CM | POA: Diagnosis not present

## 2012-09-14 DIAGNOSIS — M79609 Pain in unspecified limb: Secondary | ICD-10-CM | POA: Diagnosis not present

## 2012-09-14 DIAGNOSIS — F0390 Unspecified dementia without behavioral disturbance: Secondary | ICD-10-CM | POA: Diagnosis not present

## 2012-09-15 LAB — STOOL CULTURE

## 2012-10-18 DIAGNOSIS — Z006 Encounter for examination for normal comparison and control in clinical research program: Secondary | ICD-10-CM | POA: Diagnosis not present

## 2012-10-18 DIAGNOSIS — Z111 Encounter for screening for respiratory tuberculosis: Secondary | ICD-10-CM | POA: Diagnosis not present

## 2012-10-18 DIAGNOSIS — I669 Occlusion and stenosis of unspecified cerebral artery: Secondary | ICD-10-CM | POA: Diagnosis not present

## 2012-10-18 DIAGNOSIS — F0390 Unspecified dementia without behavioral disturbance: Secondary | ICD-10-CM | POA: Diagnosis not present

## 2012-10-18 DIAGNOSIS — R011 Cardiac murmur, unspecified: Secondary | ICD-10-CM | POA: Diagnosis not present

## 2012-10-18 DIAGNOSIS — E119 Type 2 diabetes mellitus without complications: Secondary | ICD-10-CM | POA: Diagnosis not present

## 2012-10-18 DIAGNOSIS — Z7902 Long term (current) use of antithrombotics/antiplatelets: Secondary | ICD-10-CM | POA: Diagnosis not present

## 2012-10-18 DIAGNOSIS — M12849 Other specific arthropathies, not elsewhere classified, unspecified hand: Secondary | ICD-10-CM | POA: Diagnosis not present

## 2012-10-18 DIAGNOSIS — E0590 Thyrotoxicosis, unspecified without thyrotoxic crisis or storm: Secondary | ICD-10-CM | POA: Diagnosis not present

## 2012-10-18 DIAGNOSIS — E782 Mixed hyperlipidemia: Secondary | ICD-10-CM | POA: Diagnosis not present

## 2012-10-18 DIAGNOSIS — I10 Essential (primary) hypertension: Secondary | ICD-10-CM | POA: Diagnosis not present

## 2012-10-24 DIAGNOSIS — M1710 Unilateral primary osteoarthritis, unspecified knee: Secondary | ICD-10-CM | POA: Diagnosis not present

## 2012-10-25 ENCOUNTER — Ambulatory Visit: Payer: Self-pay | Admitting: General Practice

## 2012-10-25 DIAGNOSIS — F0390 Unspecified dementia without behavioral disturbance: Secondary | ICD-10-CM | POA: Diagnosis not present

## 2012-10-25 DIAGNOSIS — K589 Irritable bowel syndrome without diarrhea: Secondary | ICD-10-CM | POA: Diagnosis not present

## 2012-10-25 DIAGNOSIS — E213 Hyperparathyroidism, unspecified: Secondary | ICD-10-CM | POA: Diagnosis not present

## 2012-10-25 DIAGNOSIS — IMO0002 Reserved for concepts with insufficient information to code with codable children: Secondary | ICD-10-CM | POA: Diagnosis not present

## 2012-10-25 DIAGNOSIS — D62 Acute posthemorrhagic anemia: Secondary | ICD-10-CM | POA: Diagnosis not present

## 2012-10-25 DIAGNOSIS — D649 Anemia, unspecified: Secondary | ICD-10-CM | POA: Diagnosis not present

## 2012-10-25 DIAGNOSIS — M79609 Pain in unspecified limb: Secondary | ICD-10-CM | POA: Diagnosis not present

## 2012-10-25 DIAGNOSIS — Z01812 Encounter for preprocedural laboratory examination: Secondary | ICD-10-CM | POA: Diagnosis not present

## 2012-10-25 DIAGNOSIS — E559 Vitamin D deficiency, unspecified: Secondary | ICD-10-CM | POA: Diagnosis not present

## 2012-10-25 DIAGNOSIS — E119 Type 2 diabetes mellitus without complications: Secondary | ICD-10-CM | POA: Diagnosis not present

## 2012-10-25 DIAGNOSIS — Z79899 Other long term (current) drug therapy: Secondary | ICD-10-CM | POA: Diagnosis not present

## 2012-10-25 LAB — BASIC METABOLIC PANEL
Anion Gap: 4 — ABNORMAL LOW (ref 7–16)
BUN: 17 mg/dL (ref 7–18)
Co2: 31 mmol/L (ref 21–32)
Creatinine: 0.74 mg/dL (ref 0.60–1.30)
EGFR (African American): >60
EGFR (Non-African Amer.): >60
Osmolality: 283 (ref 275–301)
Potassium: 3.9 mmol/L (ref 3.5–5.1)
Sodium: 140 mmol/L (ref 136–145)

## 2012-10-25 LAB — CBC
HCT: 34.1 % — ABNORMAL LOW (ref 35.0–47.0)
HGB: 11.5 g/dL — ABNORMAL LOW (ref 12.0–16.0)
MCH: 29.9 pg (ref 26.0–34.0)
MCHC: 33.6 g/dL (ref 32.0–36.0)
Platelet: 247 10*3/uL (ref 150–440)
RDW: 13.9 % (ref 11.5–14.5)

## 2012-10-25 LAB — URINALYSIS, COMPLETE
Bacteria: NONE SEEN
Bilirubin,UR: NEGATIVE
Glucose,UR: NEGATIVE mg/dL (ref 0–75)
Hyaline Cast: 20
Ketone: NEGATIVE
Leukocyte Esterase: NEGATIVE
Nitrite: NEGATIVE
Ph: 5 (ref 4.5–8.0)
Protein: NEGATIVE
RBC,UR: NONE SEEN /HPF (ref 0–5)
Specific Gravity: 1.01 (ref 1.003–1.030)
Squamous Epithelial: <1
WBC UR: NONE SEEN /HPF (ref 0–5)

## 2012-10-25 LAB — SEDIMENTATION RATE: Erythrocyte Sed Rate: 39 mm/hr — ABNORMAL HIGH (ref 0–30)

## 2012-10-25 LAB — APTT: Activated PTT: 26 secs (ref 23.6–35.9)

## 2012-10-25 LAB — MRSA PCR SCREENING

## 2012-10-26 LAB — URINE CULTURE

## 2012-11-03 DIAGNOSIS — I10 Essential (primary) hypertension: Secondary | ICD-10-CM | POA: Diagnosis not present

## 2012-11-03 DIAGNOSIS — M1710 Unilateral primary osteoarthritis, unspecified knee: Secondary | ICD-10-CM | POA: Diagnosis not present

## 2012-11-03 DIAGNOSIS — R32 Unspecified urinary incontinence: Secondary | ICD-10-CM | POA: Diagnosis not present

## 2012-11-03 DIAGNOSIS — E119 Type 2 diabetes mellitus without complications: Secondary | ICD-10-CM | POA: Diagnosis not present

## 2012-11-03 DIAGNOSIS — R269 Unspecified abnormalities of gait and mobility: Secondary | ICD-10-CM | POA: Diagnosis not present

## 2012-11-03 DIAGNOSIS — F0390 Unspecified dementia without behavioral disturbance: Secondary | ICD-10-CM | POA: Diagnosis not present

## 2012-11-03 DIAGNOSIS — Z9181 History of falling: Secondary | ICD-10-CM | POA: Diagnosis not present

## 2012-11-06 ENCOUNTER — Inpatient Hospital Stay: Payer: Self-pay | Admitting: General Practice

## 2012-11-06 DIAGNOSIS — IMO0002 Reserved for concepts with insufficient information to code with codable children: Secondary | ICD-10-CM | POA: Diagnosis not present

## 2012-11-06 DIAGNOSIS — E559 Vitamin D deficiency, unspecified: Secondary | ICD-10-CM | POA: Diagnosis present

## 2012-11-06 DIAGNOSIS — D649 Anemia, unspecified: Secondary | ICD-10-CM | POA: Diagnosis present

## 2012-11-06 DIAGNOSIS — M6281 Muscle weakness (generalized): Secondary | ICD-10-CM | POA: Diagnosis not present

## 2012-11-06 DIAGNOSIS — I10 Essential (primary) hypertension: Secondary | ICD-10-CM | POA: Diagnosis not present

## 2012-11-06 DIAGNOSIS — Z96659 Presence of unspecified artificial knee joint: Secondary | ICD-10-CM | POA: Diagnosis not present

## 2012-11-06 DIAGNOSIS — Z9181 History of falling: Secondary | ICD-10-CM | POA: Diagnosis not present

## 2012-11-06 DIAGNOSIS — E213 Hyperparathyroidism, unspecified: Secondary | ICD-10-CM | POA: Diagnosis present

## 2012-11-06 DIAGNOSIS — D62 Acute posthemorrhagic anemia: Secondary | ICD-10-CM | POA: Diagnosis not present

## 2012-11-06 DIAGNOSIS — Z471 Aftercare following joint replacement surgery: Secondary | ICD-10-CM | POA: Diagnosis not present

## 2012-11-06 DIAGNOSIS — Z5189 Encounter for other specified aftercare: Secondary | ICD-10-CM | POA: Diagnosis not present

## 2012-11-06 DIAGNOSIS — R269 Unspecified abnormalities of gait and mobility: Secondary | ICD-10-CM | POA: Diagnosis not present

## 2012-11-06 DIAGNOSIS — F0390 Unspecified dementia without behavioral disturbance: Secondary | ICD-10-CM | POA: Diagnosis not present

## 2012-11-06 DIAGNOSIS — M1710 Unilateral primary osteoarthritis, unspecified knee: Secondary | ICD-10-CM | POA: Diagnosis not present

## 2012-11-06 DIAGNOSIS — K589 Irritable bowel syndrome without diarrhea: Secondary | ICD-10-CM | POA: Diagnosis present

## 2012-11-06 DIAGNOSIS — E119 Type 2 diabetes mellitus without complications: Secondary | ICD-10-CM | POA: Diagnosis not present

## 2012-11-06 DIAGNOSIS — Z9889 Other specified postprocedural states: Secondary | ICD-10-CM | POA: Diagnosis not present

## 2012-11-06 DIAGNOSIS — R6889 Other general symptoms and signs: Secondary | ICD-10-CM | POA: Diagnosis not present

## 2012-11-06 DIAGNOSIS — R32 Unspecified urinary incontinence: Secondary | ICD-10-CM | POA: Diagnosis not present

## 2012-11-06 DIAGNOSIS — E0590 Thyrotoxicosis, unspecified without thyrotoxic crisis or storm: Secondary | ICD-10-CM | POA: Diagnosis present

## 2012-11-06 DIAGNOSIS — K219 Gastro-esophageal reflux disease without esophagitis: Secondary | ICD-10-CM | POA: Diagnosis present

## 2012-11-07 LAB — PLATELET COUNT: Platelet: 219 10*3/uL (ref 150–440)

## 2012-11-07 LAB — BASIC METABOLIC PANEL
Anion Gap: 4 — ABNORMAL LOW (ref 7–16)
BUN: 13 mg/dL (ref 7–18)
Calcium, Total: 9.5 mg/dL (ref 8.5–10.1)
Creatinine: 0.6 mg/dL (ref 0.60–1.30)
EGFR (African American): >60
EGFR (Non-African Amer.): >60
Potassium: 3.6 mmol/L (ref 3.5–5.1)

## 2012-11-08 LAB — BASIC METABOLIC PANEL
BUN: 10 mg/dL (ref 7–18)
Calcium, Total: 9.5 mg/dL (ref 8.5–10.1)
Creatinine: 0.51 mg/dL — ABNORMAL LOW (ref 0.60–1.30)
Glucose: 143 mg/dL — ABNORMAL HIGH (ref 65–99)
Potassium: 3.5 mmol/L (ref 3.5–5.1)
Sodium: 136 mmol/L (ref 136–145)

## 2012-11-08 LAB — PLATELET COUNT: Platelet: 193 10*3/uL (ref 150–440)

## 2012-11-09 ENCOUNTER — Encounter: Payer: Self-pay | Admitting: Internal Medicine

## 2012-11-09 DIAGNOSIS — E119 Type 2 diabetes mellitus without complications: Secondary | ICD-10-CM | POA: Diagnosis not present

## 2012-11-09 DIAGNOSIS — R269 Unspecified abnormalities of gait and mobility: Secondary | ICD-10-CM | POA: Diagnosis not present

## 2012-11-09 DIAGNOSIS — M1710 Unilateral primary osteoarthritis, unspecified knee: Secondary | ICD-10-CM | POA: Diagnosis not present

## 2012-11-09 DIAGNOSIS — R6889 Other general symptoms and signs: Secondary | ICD-10-CM | POA: Diagnosis not present

## 2012-11-09 DIAGNOSIS — Z5189 Encounter for other specified aftercare: Secondary | ICD-10-CM | POA: Diagnosis not present

## 2012-11-09 DIAGNOSIS — F0280 Dementia in other diseases classified elsewhere without behavioral disturbance: Secondary | ICD-10-CM | POA: Diagnosis not present

## 2012-11-09 DIAGNOSIS — Z96659 Presence of unspecified artificial knee joint: Secondary | ICD-10-CM | POA: Diagnosis not present

## 2012-11-09 DIAGNOSIS — R32 Unspecified urinary incontinence: Secondary | ICD-10-CM | POA: Diagnosis not present

## 2012-11-09 DIAGNOSIS — Z471 Aftercare following joint replacement surgery: Secondary | ICD-10-CM | POA: Diagnosis not present

## 2012-11-09 DIAGNOSIS — M6281 Muscle weakness (generalized): Secondary | ICD-10-CM | POA: Diagnosis not present

## 2012-11-09 DIAGNOSIS — I10 Essential (primary) hypertension: Secondary | ICD-10-CM | POA: Diagnosis not present

## 2012-11-09 DIAGNOSIS — M1991 Primary osteoarthritis, unspecified site: Secondary | ICD-10-CM | POA: Diagnosis not present

## 2012-11-09 DIAGNOSIS — E559 Vitamin D deficiency, unspecified: Secondary | ICD-10-CM | POA: Diagnosis not present

## 2012-11-09 DIAGNOSIS — Z9181 History of falling: Secondary | ICD-10-CM | POA: Diagnosis not present

## 2012-11-09 DIAGNOSIS — D649 Anemia, unspecified: Secondary | ICD-10-CM | POA: Diagnosis not present

## 2012-11-09 DIAGNOSIS — F0390 Unspecified dementia without behavioral disturbance: Secondary | ICD-10-CM | POA: Diagnosis not present

## 2012-11-10 ENCOUNTER — Encounter: Payer: Self-pay | Admitting: Internal Medicine

## 2012-11-13 DIAGNOSIS — E119 Type 2 diabetes mellitus without complications: Secondary | ICD-10-CM | POA: Diagnosis not present

## 2012-11-13 DIAGNOSIS — G309 Alzheimer's disease, unspecified: Secondary | ICD-10-CM | POA: Diagnosis not present

## 2012-11-13 DIAGNOSIS — M1991 Primary osteoarthritis, unspecified site: Secondary | ICD-10-CM | POA: Diagnosis not present

## 2012-12-11 ENCOUNTER — Encounter: Payer: Self-pay | Admitting: Internal Medicine

## 2012-12-14 DIAGNOSIS — E119 Type 2 diabetes mellitus without complications: Secondary | ICD-10-CM | POA: Diagnosis not present

## 2012-12-14 DIAGNOSIS — I10 Essential (primary) hypertension: Secondary | ICD-10-CM | POA: Diagnosis not present

## 2012-12-14 DIAGNOSIS — F0390 Unspecified dementia without behavioral disturbance: Secondary | ICD-10-CM | POA: Diagnosis not present

## 2012-12-14 DIAGNOSIS — M1710 Unilateral primary osteoarthritis, unspecified knee: Secondary | ICD-10-CM | POA: Diagnosis not present

## 2012-12-14 DIAGNOSIS — R32 Unspecified urinary incontinence: Secondary | ICD-10-CM | POA: Diagnosis not present

## 2012-12-14 DIAGNOSIS — R269 Unspecified abnormalities of gait and mobility: Secondary | ICD-10-CM | POA: Diagnosis not present

## 2012-12-15 DIAGNOSIS — I10 Essential (primary) hypertension: Secondary | ICD-10-CM | POA: Diagnosis not present

## 2012-12-15 DIAGNOSIS — R32 Unspecified urinary incontinence: Secondary | ICD-10-CM | POA: Diagnosis not present

## 2012-12-15 DIAGNOSIS — F0390 Unspecified dementia without behavioral disturbance: Secondary | ICD-10-CM | POA: Diagnosis not present

## 2012-12-15 DIAGNOSIS — M1710 Unilateral primary osteoarthritis, unspecified knee: Secondary | ICD-10-CM | POA: Diagnosis not present

## 2012-12-15 DIAGNOSIS — E119 Type 2 diabetes mellitus without complications: Secondary | ICD-10-CM | POA: Diagnosis not present

## 2012-12-15 DIAGNOSIS — R269 Unspecified abnormalities of gait and mobility: Secondary | ICD-10-CM | POA: Diagnosis not present

## 2012-12-18 DIAGNOSIS — F0390 Unspecified dementia without behavioral disturbance: Secondary | ICD-10-CM | POA: Diagnosis not present

## 2012-12-18 DIAGNOSIS — I10 Essential (primary) hypertension: Secondary | ICD-10-CM | POA: Diagnosis not present

## 2012-12-18 DIAGNOSIS — E119 Type 2 diabetes mellitus without complications: Secondary | ICD-10-CM | POA: Diagnosis not present

## 2012-12-18 DIAGNOSIS — R269 Unspecified abnormalities of gait and mobility: Secondary | ICD-10-CM | POA: Diagnosis not present

## 2012-12-18 DIAGNOSIS — M1710 Unilateral primary osteoarthritis, unspecified knee: Secondary | ICD-10-CM | POA: Diagnosis not present

## 2012-12-18 DIAGNOSIS — R32 Unspecified urinary incontinence: Secondary | ICD-10-CM | POA: Diagnosis not present

## 2012-12-19 DIAGNOSIS — Z96659 Presence of unspecified artificial knee joint: Secondary | ICD-10-CM | POA: Diagnosis not present

## 2012-12-20 DIAGNOSIS — E119 Type 2 diabetes mellitus without complications: Secondary | ICD-10-CM | POA: Diagnosis not present

## 2012-12-20 DIAGNOSIS — M1710 Unilateral primary osteoarthritis, unspecified knee: Secondary | ICD-10-CM | POA: Diagnosis not present

## 2012-12-20 DIAGNOSIS — R32 Unspecified urinary incontinence: Secondary | ICD-10-CM | POA: Diagnosis not present

## 2012-12-20 DIAGNOSIS — R269 Unspecified abnormalities of gait and mobility: Secondary | ICD-10-CM | POA: Diagnosis not present

## 2012-12-20 DIAGNOSIS — I10 Essential (primary) hypertension: Secondary | ICD-10-CM | POA: Diagnosis not present

## 2012-12-20 DIAGNOSIS — F0390 Unspecified dementia without behavioral disturbance: Secondary | ICD-10-CM | POA: Diagnosis not present

## 2012-12-21 DIAGNOSIS — E119 Type 2 diabetes mellitus without complications: Secondary | ICD-10-CM | POA: Diagnosis not present

## 2012-12-21 DIAGNOSIS — F0390 Unspecified dementia without behavioral disturbance: Secondary | ICD-10-CM | POA: Diagnosis not present

## 2012-12-21 DIAGNOSIS — R32 Unspecified urinary incontinence: Secondary | ICD-10-CM | POA: Diagnosis not present

## 2012-12-21 DIAGNOSIS — I10 Essential (primary) hypertension: Secondary | ICD-10-CM | POA: Diagnosis not present

## 2012-12-21 DIAGNOSIS — R269 Unspecified abnormalities of gait and mobility: Secondary | ICD-10-CM | POA: Diagnosis not present

## 2012-12-21 DIAGNOSIS — M1710 Unilateral primary osteoarthritis, unspecified knee: Secondary | ICD-10-CM | POA: Diagnosis not present

## 2012-12-22 DIAGNOSIS — E119 Type 2 diabetes mellitus without complications: Secondary | ICD-10-CM | POA: Diagnosis not present

## 2012-12-22 DIAGNOSIS — R269 Unspecified abnormalities of gait and mobility: Secondary | ICD-10-CM | POA: Diagnosis not present

## 2012-12-22 DIAGNOSIS — M1710 Unilateral primary osteoarthritis, unspecified knee: Secondary | ICD-10-CM | POA: Diagnosis not present

## 2012-12-22 DIAGNOSIS — F0390 Unspecified dementia without behavioral disturbance: Secondary | ICD-10-CM | POA: Diagnosis not present

## 2012-12-22 DIAGNOSIS — I10 Essential (primary) hypertension: Secondary | ICD-10-CM | POA: Diagnosis not present

## 2012-12-22 DIAGNOSIS — R32 Unspecified urinary incontinence: Secondary | ICD-10-CM | POA: Diagnosis not present

## 2012-12-25 DIAGNOSIS — M1710 Unilateral primary osteoarthritis, unspecified knee: Secondary | ICD-10-CM | POA: Diagnosis not present

## 2012-12-25 DIAGNOSIS — I10 Essential (primary) hypertension: Secondary | ICD-10-CM | POA: Diagnosis not present

## 2012-12-25 DIAGNOSIS — F0390 Unspecified dementia without behavioral disturbance: Secondary | ICD-10-CM | POA: Diagnosis not present

## 2012-12-25 DIAGNOSIS — R269 Unspecified abnormalities of gait and mobility: Secondary | ICD-10-CM | POA: Diagnosis not present

## 2012-12-25 DIAGNOSIS — R32 Unspecified urinary incontinence: Secondary | ICD-10-CM | POA: Diagnosis not present

## 2012-12-25 DIAGNOSIS — E119 Type 2 diabetes mellitus without complications: Secondary | ICD-10-CM | POA: Diagnosis not present

## 2012-12-26 DIAGNOSIS — E119 Type 2 diabetes mellitus without complications: Secondary | ICD-10-CM | POA: Diagnosis not present

## 2012-12-26 DIAGNOSIS — M1710 Unilateral primary osteoarthritis, unspecified knee: Secondary | ICD-10-CM | POA: Diagnosis not present

## 2012-12-26 DIAGNOSIS — R32 Unspecified urinary incontinence: Secondary | ICD-10-CM | POA: Diagnosis not present

## 2012-12-26 DIAGNOSIS — F0390 Unspecified dementia without behavioral disturbance: Secondary | ICD-10-CM | POA: Diagnosis not present

## 2012-12-26 DIAGNOSIS — R269 Unspecified abnormalities of gait and mobility: Secondary | ICD-10-CM | POA: Diagnosis not present

## 2012-12-26 DIAGNOSIS — I10 Essential (primary) hypertension: Secondary | ICD-10-CM | POA: Diagnosis not present

## 2012-12-27 DIAGNOSIS — E119 Type 2 diabetes mellitus without complications: Secondary | ICD-10-CM | POA: Diagnosis not present

## 2012-12-27 DIAGNOSIS — F0390 Unspecified dementia without behavioral disturbance: Secondary | ICD-10-CM | POA: Diagnosis not present

## 2012-12-27 DIAGNOSIS — H903 Sensorineural hearing loss, bilateral: Secondary | ICD-10-CM | POA: Diagnosis not present

## 2012-12-27 DIAGNOSIS — R42 Dizziness and giddiness: Secondary | ICD-10-CM | POA: Diagnosis not present

## 2012-12-27 DIAGNOSIS — I10 Essential (primary) hypertension: Secondary | ICD-10-CM | POA: Diagnosis not present

## 2012-12-27 DIAGNOSIS — R32 Unspecified urinary incontinence: Secondary | ICD-10-CM | POA: Diagnosis not present

## 2012-12-27 DIAGNOSIS — M1710 Unilateral primary osteoarthritis, unspecified knee: Secondary | ICD-10-CM | POA: Diagnosis not present

## 2012-12-27 DIAGNOSIS — H9319 Tinnitus, unspecified ear: Secondary | ICD-10-CM | POA: Diagnosis not present

## 2012-12-27 DIAGNOSIS — R269 Unspecified abnormalities of gait and mobility: Secondary | ICD-10-CM | POA: Diagnosis not present

## 2012-12-28 DIAGNOSIS — F0390 Unspecified dementia without behavioral disturbance: Secondary | ICD-10-CM | POA: Diagnosis not present

## 2012-12-28 DIAGNOSIS — R269 Unspecified abnormalities of gait and mobility: Secondary | ICD-10-CM | POA: Diagnosis not present

## 2012-12-28 DIAGNOSIS — E119 Type 2 diabetes mellitus without complications: Secondary | ICD-10-CM | POA: Diagnosis not present

## 2012-12-28 DIAGNOSIS — R32 Unspecified urinary incontinence: Secondary | ICD-10-CM | POA: Diagnosis not present

## 2012-12-28 DIAGNOSIS — I10 Essential (primary) hypertension: Secondary | ICD-10-CM | POA: Diagnosis not present

## 2012-12-28 DIAGNOSIS — M1710 Unilateral primary osteoarthritis, unspecified knee: Secondary | ICD-10-CM | POA: Diagnosis not present

## 2012-12-29 DIAGNOSIS — F0390 Unspecified dementia without behavioral disturbance: Secondary | ICD-10-CM | POA: Diagnosis not present

## 2012-12-29 DIAGNOSIS — R32 Unspecified urinary incontinence: Secondary | ICD-10-CM | POA: Diagnosis not present

## 2012-12-29 DIAGNOSIS — I10 Essential (primary) hypertension: Secondary | ICD-10-CM | POA: Diagnosis not present

## 2012-12-29 DIAGNOSIS — R269 Unspecified abnormalities of gait and mobility: Secondary | ICD-10-CM | POA: Diagnosis not present

## 2012-12-29 DIAGNOSIS — M1710 Unilateral primary osteoarthritis, unspecified knee: Secondary | ICD-10-CM | POA: Diagnosis not present

## 2012-12-29 DIAGNOSIS — E119 Type 2 diabetes mellitus without complications: Secondary | ICD-10-CM | POA: Diagnosis not present

## 2013-01-02 DIAGNOSIS — E119 Type 2 diabetes mellitus without complications: Secondary | ICD-10-CM | POA: Diagnosis not present

## 2013-01-02 DIAGNOSIS — F0390 Unspecified dementia without behavioral disturbance: Secondary | ICD-10-CM | POA: Diagnosis not present

## 2013-01-02 DIAGNOSIS — Z96659 Presence of unspecified artificial knee joint: Secondary | ICD-10-CM | POA: Diagnosis not present

## 2013-01-02 DIAGNOSIS — Z9181 History of falling: Secondary | ICD-10-CM | POA: Diagnosis not present

## 2013-01-02 DIAGNOSIS — I10 Essential (primary) hypertension: Secondary | ICD-10-CM | POA: Diagnosis not present

## 2013-01-02 DIAGNOSIS — Z471 Aftercare following joint replacement surgery: Secondary | ICD-10-CM | POA: Diagnosis not present

## 2013-01-02 DIAGNOSIS — Z5189 Encounter for other specified aftercare: Secondary | ICD-10-CM | POA: Diagnosis not present

## 2013-01-02 DIAGNOSIS — R32 Unspecified urinary incontinence: Secondary | ICD-10-CM | POA: Diagnosis not present

## 2013-01-02 DIAGNOSIS — R269 Unspecified abnormalities of gait and mobility: Secondary | ICD-10-CM | POA: Diagnosis not present

## 2013-01-03 DIAGNOSIS — Z5189 Encounter for other specified aftercare: Secondary | ICD-10-CM | POA: Diagnosis not present

## 2013-01-03 DIAGNOSIS — Z96659 Presence of unspecified artificial knee joint: Secondary | ICD-10-CM | POA: Diagnosis not present

## 2013-01-03 DIAGNOSIS — R269 Unspecified abnormalities of gait and mobility: Secondary | ICD-10-CM | POA: Diagnosis not present

## 2013-01-03 DIAGNOSIS — F0390 Unspecified dementia without behavioral disturbance: Secondary | ICD-10-CM | POA: Diagnosis not present

## 2013-01-03 DIAGNOSIS — Z471 Aftercare following joint replacement surgery: Secondary | ICD-10-CM | POA: Diagnosis not present

## 2013-01-03 DIAGNOSIS — E119 Type 2 diabetes mellitus without complications: Secondary | ICD-10-CM | POA: Diagnosis not present

## 2013-01-04 DIAGNOSIS — E119 Type 2 diabetes mellitus without complications: Secondary | ICD-10-CM | POA: Diagnosis not present

## 2013-01-04 DIAGNOSIS — Z96659 Presence of unspecified artificial knee joint: Secondary | ICD-10-CM | POA: Diagnosis not present

## 2013-01-04 DIAGNOSIS — R32 Unspecified urinary incontinence: Secondary | ICD-10-CM | POA: Diagnosis not present

## 2013-01-04 DIAGNOSIS — Z471 Aftercare following joint replacement surgery: Secondary | ICD-10-CM | POA: Diagnosis not present

## 2013-01-04 DIAGNOSIS — R269 Unspecified abnormalities of gait and mobility: Secondary | ICD-10-CM | POA: Diagnosis not present

## 2013-01-04 DIAGNOSIS — Z5189 Encounter for other specified aftercare: Secondary | ICD-10-CM | POA: Diagnosis not present

## 2013-01-04 DIAGNOSIS — I10 Essential (primary) hypertension: Secondary | ICD-10-CM | POA: Diagnosis not present

## 2013-01-04 DIAGNOSIS — F0390 Unspecified dementia without behavioral disturbance: Secondary | ICD-10-CM | POA: Diagnosis not present

## 2013-01-05 DIAGNOSIS — Z96659 Presence of unspecified artificial knee joint: Secondary | ICD-10-CM | POA: Diagnosis not present

## 2013-01-05 DIAGNOSIS — Z5189 Encounter for other specified aftercare: Secondary | ICD-10-CM | POA: Diagnosis not present

## 2013-01-05 DIAGNOSIS — F0390 Unspecified dementia without behavioral disturbance: Secondary | ICD-10-CM | POA: Diagnosis not present

## 2013-01-05 DIAGNOSIS — Z471 Aftercare following joint replacement surgery: Secondary | ICD-10-CM | POA: Diagnosis not present

## 2013-01-05 DIAGNOSIS — E119 Type 2 diabetes mellitus without complications: Secondary | ICD-10-CM | POA: Diagnosis not present

## 2013-01-05 DIAGNOSIS — R269 Unspecified abnormalities of gait and mobility: Secondary | ICD-10-CM | POA: Diagnosis not present

## 2013-01-08 DIAGNOSIS — E119 Type 2 diabetes mellitus without complications: Secondary | ICD-10-CM | POA: Diagnosis not present

## 2013-01-08 DIAGNOSIS — Z471 Aftercare following joint replacement surgery: Secondary | ICD-10-CM | POA: Diagnosis not present

## 2013-01-08 DIAGNOSIS — R269 Unspecified abnormalities of gait and mobility: Secondary | ICD-10-CM | POA: Diagnosis not present

## 2013-01-08 DIAGNOSIS — Z96659 Presence of unspecified artificial knee joint: Secondary | ICD-10-CM | POA: Diagnosis not present

## 2013-01-08 DIAGNOSIS — F0390 Unspecified dementia without behavioral disturbance: Secondary | ICD-10-CM | POA: Diagnosis not present

## 2013-01-08 DIAGNOSIS — Z5189 Encounter for other specified aftercare: Secondary | ICD-10-CM | POA: Diagnosis not present

## 2013-01-09 DIAGNOSIS — Z96659 Presence of unspecified artificial knee joint: Secondary | ICD-10-CM | POA: Diagnosis not present

## 2013-01-09 DIAGNOSIS — F0390 Unspecified dementia without behavioral disturbance: Secondary | ICD-10-CM | POA: Diagnosis not present

## 2013-01-09 DIAGNOSIS — Z471 Aftercare following joint replacement surgery: Secondary | ICD-10-CM | POA: Diagnosis not present

## 2013-01-09 DIAGNOSIS — Z5189 Encounter for other specified aftercare: Secondary | ICD-10-CM | POA: Diagnosis not present

## 2013-01-09 DIAGNOSIS — E119 Type 2 diabetes mellitus without complications: Secondary | ICD-10-CM | POA: Diagnosis not present

## 2013-01-09 DIAGNOSIS — R269 Unspecified abnormalities of gait and mobility: Secondary | ICD-10-CM | POA: Diagnosis not present

## 2013-01-10 DIAGNOSIS — Z471 Aftercare following joint replacement surgery: Secondary | ICD-10-CM | POA: Diagnosis not present

## 2013-01-10 DIAGNOSIS — E119 Type 2 diabetes mellitus without complications: Secondary | ICD-10-CM | POA: Diagnosis not present

## 2013-01-10 DIAGNOSIS — Z96659 Presence of unspecified artificial knee joint: Secondary | ICD-10-CM | POA: Diagnosis not present

## 2013-01-10 DIAGNOSIS — F0390 Unspecified dementia without behavioral disturbance: Secondary | ICD-10-CM | POA: Diagnosis not present

## 2013-01-10 DIAGNOSIS — R269 Unspecified abnormalities of gait and mobility: Secondary | ICD-10-CM | POA: Diagnosis not present

## 2013-01-10 DIAGNOSIS — Z5189 Encounter for other specified aftercare: Secondary | ICD-10-CM | POA: Diagnosis not present

## 2013-01-11 DIAGNOSIS — R269 Unspecified abnormalities of gait and mobility: Secondary | ICD-10-CM | POA: Diagnosis not present

## 2013-01-11 DIAGNOSIS — F0390 Unspecified dementia without behavioral disturbance: Secondary | ICD-10-CM | POA: Diagnosis not present

## 2013-01-11 DIAGNOSIS — Z5189 Encounter for other specified aftercare: Secondary | ICD-10-CM | POA: Diagnosis not present

## 2013-01-11 DIAGNOSIS — Z471 Aftercare following joint replacement surgery: Secondary | ICD-10-CM | POA: Diagnosis not present

## 2013-01-11 DIAGNOSIS — E119 Type 2 diabetes mellitus without complications: Secondary | ICD-10-CM | POA: Diagnosis not present

## 2013-01-11 DIAGNOSIS — Z96659 Presence of unspecified artificial knee joint: Secondary | ICD-10-CM | POA: Diagnosis not present

## 2013-01-15 DIAGNOSIS — F0390 Unspecified dementia without behavioral disturbance: Secondary | ICD-10-CM | POA: Diagnosis not present

## 2013-01-15 DIAGNOSIS — R42 Dizziness and giddiness: Secondary | ICD-10-CM | POA: Diagnosis not present

## 2013-01-15 DIAGNOSIS — E119 Type 2 diabetes mellitus without complications: Secondary | ICD-10-CM | POA: Diagnosis not present

## 2013-01-15 DIAGNOSIS — Z96659 Presence of unspecified artificial knee joint: Secondary | ICD-10-CM | POA: Diagnosis not present

## 2013-01-15 DIAGNOSIS — R269 Unspecified abnormalities of gait and mobility: Secondary | ICD-10-CM | POA: Diagnosis not present

## 2013-01-15 DIAGNOSIS — Z471 Aftercare following joint replacement surgery: Secondary | ICD-10-CM | POA: Diagnosis not present

## 2013-01-15 DIAGNOSIS — Z5189 Encounter for other specified aftercare: Secondary | ICD-10-CM | POA: Diagnosis not present

## 2013-01-16 DIAGNOSIS — E119 Type 2 diabetes mellitus without complications: Secondary | ICD-10-CM | POA: Diagnosis not present

## 2013-01-16 DIAGNOSIS — Z471 Aftercare following joint replacement surgery: Secondary | ICD-10-CM | POA: Diagnosis not present

## 2013-01-16 DIAGNOSIS — Z96659 Presence of unspecified artificial knee joint: Secondary | ICD-10-CM | POA: Diagnosis not present

## 2013-01-16 DIAGNOSIS — Z5189 Encounter for other specified aftercare: Secondary | ICD-10-CM | POA: Diagnosis not present

## 2013-01-16 DIAGNOSIS — R269 Unspecified abnormalities of gait and mobility: Secondary | ICD-10-CM | POA: Diagnosis not present

## 2013-01-16 DIAGNOSIS — F0390 Unspecified dementia without behavioral disturbance: Secondary | ICD-10-CM | POA: Diagnosis not present

## 2013-01-18 DIAGNOSIS — E119 Type 2 diabetes mellitus without complications: Secondary | ICD-10-CM | POA: Diagnosis not present

## 2013-01-18 DIAGNOSIS — Z96659 Presence of unspecified artificial knee joint: Secondary | ICD-10-CM | POA: Diagnosis not present

## 2013-01-18 DIAGNOSIS — Z471 Aftercare following joint replacement surgery: Secondary | ICD-10-CM | POA: Diagnosis not present

## 2013-01-18 DIAGNOSIS — R269 Unspecified abnormalities of gait and mobility: Secondary | ICD-10-CM | POA: Diagnosis not present

## 2013-01-18 DIAGNOSIS — Z5189 Encounter for other specified aftercare: Secondary | ICD-10-CM | POA: Diagnosis not present

## 2013-01-18 DIAGNOSIS — F0390 Unspecified dementia without behavioral disturbance: Secondary | ICD-10-CM | POA: Diagnosis not present

## 2013-01-19 DIAGNOSIS — M79609 Pain in unspecified limb: Secondary | ICD-10-CM | POA: Diagnosis not present

## 2013-01-19 DIAGNOSIS — I6529 Occlusion and stenosis of unspecified carotid artery: Secondary | ICD-10-CM | POA: Diagnosis not present

## 2013-01-19 DIAGNOSIS — R42 Dizziness and giddiness: Secondary | ICD-10-CM | POA: Diagnosis not present

## 2013-01-19 DIAGNOSIS — I10 Essential (primary) hypertension: Secondary | ICD-10-CM | POA: Diagnosis not present

## 2013-01-19 DIAGNOSIS — H903 Sensorineural hearing loss, bilateral: Secondary | ICD-10-CM | POA: Diagnosis not present

## 2013-01-19 DIAGNOSIS — I2510 Atherosclerotic heart disease of native coronary artery without angina pectoris: Secondary | ICD-10-CM | POA: Diagnosis not present

## 2013-01-23 DIAGNOSIS — Z96659 Presence of unspecified artificial knee joint: Secondary | ICD-10-CM | POA: Diagnosis not present

## 2013-01-23 DIAGNOSIS — R269 Unspecified abnormalities of gait and mobility: Secondary | ICD-10-CM | POA: Diagnosis not present

## 2013-01-23 DIAGNOSIS — Z5189 Encounter for other specified aftercare: Secondary | ICD-10-CM | POA: Diagnosis not present

## 2013-01-23 DIAGNOSIS — E119 Type 2 diabetes mellitus without complications: Secondary | ICD-10-CM | POA: Diagnosis not present

## 2013-01-23 DIAGNOSIS — F0390 Unspecified dementia without behavioral disturbance: Secondary | ICD-10-CM | POA: Diagnosis not present

## 2013-01-23 DIAGNOSIS — Z471 Aftercare following joint replacement surgery: Secondary | ICD-10-CM | POA: Diagnosis not present

## 2013-01-25 DIAGNOSIS — Z471 Aftercare following joint replacement surgery: Secondary | ICD-10-CM | POA: Diagnosis not present

## 2013-01-25 DIAGNOSIS — Z96659 Presence of unspecified artificial knee joint: Secondary | ICD-10-CM | POA: Diagnosis not present

## 2013-01-25 DIAGNOSIS — R269 Unspecified abnormalities of gait and mobility: Secondary | ICD-10-CM | POA: Diagnosis not present

## 2013-01-25 DIAGNOSIS — Z5189 Encounter for other specified aftercare: Secondary | ICD-10-CM | POA: Diagnosis not present

## 2013-01-25 DIAGNOSIS — E119 Type 2 diabetes mellitus without complications: Secondary | ICD-10-CM | POA: Diagnosis not present

## 2013-01-25 DIAGNOSIS — F0390 Unspecified dementia without behavioral disturbance: Secondary | ICD-10-CM | POA: Diagnosis not present

## 2013-01-29 DIAGNOSIS — Z471 Aftercare following joint replacement surgery: Secondary | ICD-10-CM | POA: Diagnosis not present

## 2013-01-29 DIAGNOSIS — E119 Type 2 diabetes mellitus without complications: Secondary | ICD-10-CM | POA: Diagnosis not present

## 2013-01-29 DIAGNOSIS — Z96659 Presence of unspecified artificial knee joint: Secondary | ICD-10-CM | POA: Diagnosis not present

## 2013-01-29 DIAGNOSIS — F0390 Unspecified dementia without behavioral disturbance: Secondary | ICD-10-CM | POA: Diagnosis not present

## 2013-01-29 DIAGNOSIS — Z5189 Encounter for other specified aftercare: Secondary | ICD-10-CM | POA: Diagnosis not present

## 2013-01-29 DIAGNOSIS — R269 Unspecified abnormalities of gait and mobility: Secondary | ICD-10-CM | POA: Diagnosis not present

## 2013-01-30 DIAGNOSIS — Z471 Aftercare following joint replacement surgery: Secondary | ICD-10-CM | POA: Diagnosis not present

## 2013-01-30 DIAGNOSIS — E119 Type 2 diabetes mellitus without complications: Secondary | ICD-10-CM | POA: Diagnosis not present

## 2013-01-30 DIAGNOSIS — R269 Unspecified abnormalities of gait and mobility: Secondary | ICD-10-CM | POA: Diagnosis not present

## 2013-01-30 DIAGNOSIS — F0390 Unspecified dementia without behavioral disturbance: Secondary | ICD-10-CM | POA: Diagnosis not present

## 2013-01-30 DIAGNOSIS — Z96659 Presence of unspecified artificial knee joint: Secondary | ICD-10-CM | POA: Diagnosis not present

## 2013-01-30 DIAGNOSIS — Z5189 Encounter for other specified aftercare: Secondary | ICD-10-CM | POA: Diagnosis not present

## 2013-01-31 DIAGNOSIS — E119 Type 2 diabetes mellitus without complications: Secondary | ICD-10-CM | POA: Diagnosis not present

## 2013-01-31 DIAGNOSIS — Z96659 Presence of unspecified artificial knee joint: Secondary | ICD-10-CM | POA: Diagnosis not present

## 2013-01-31 DIAGNOSIS — Z5189 Encounter for other specified aftercare: Secondary | ICD-10-CM | POA: Diagnosis not present

## 2013-01-31 DIAGNOSIS — F0390 Unspecified dementia without behavioral disturbance: Secondary | ICD-10-CM | POA: Diagnosis not present

## 2013-01-31 DIAGNOSIS — Z471 Aftercare following joint replacement surgery: Secondary | ICD-10-CM | POA: Diagnosis not present

## 2013-01-31 DIAGNOSIS — R269 Unspecified abnormalities of gait and mobility: Secondary | ICD-10-CM | POA: Diagnosis not present

## 2013-02-01 DIAGNOSIS — Z471 Aftercare following joint replacement surgery: Secondary | ICD-10-CM | POA: Diagnosis not present

## 2013-02-01 DIAGNOSIS — Z96659 Presence of unspecified artificial knee joint: Secondary | ICD-10-CM | POA: Diagnosis not present

## 2013-02-01 DIAGNOSIS — E119 Type 2 diabetes mellitus without complications: Secondary | ICD-10-CM | POA: Diagnosis not present

## 2013-02-01 DIAGNOSIS — Z5189 Encounter for other specified aftercare: Secondary | ICD-10-CM | POA: Diagnosis not present

## 2013-02-01 DIAGNOSIS — R269 Unspecified abnormalities of gait and mobility: Secondary | ICD-10-CM | POA: Diagnosis not present

## 2013-02-01 DIAGNOSIS — F0390 Unspecified dementia without behavioral disturbance: Secondary | ICD-10-CM | POA: Diagnosis not present

## 2013-02-07 DIAGNOSIS — Z471 Aftercare following joint replacement surgery: Secondary | ICD-10-CM | POA: Diagnosis not present

## 2013-02-07 DIAGNOSIS — E785 Hyperlipidemia, unspecified: Secondary | ICD-10-CM | POA: Diagnosis not present

## 2013-02-07 DIAGNOSIS — R269 Unspecified abnormalities of gait and mobility: Secondary | ICD-10-CM | POA: Diagnosis not present

## 2013-02-07 DIAGNOSIS — E119 Type 2 diabetes mellitus without complications: Secondary | ICD-10-CM | POA: Diagnosis not present

## 2013-02-07 DIAGNOSIS — Z96659 Presence of unspecified artificial knee joint: Secondary | ICD-10-CM | POA: Diagnosis not present

## 2013-02-07 DIAGNOSIS — I10 Essential (primary) hypertension: Secondary | ICD-10-CM | POA: Diagnosis not present

## 2013-02-07 DIAGNOSIS — F0390 Unspecified dementia without behavioral disturbance: Secondary | ICD-10-CM | POA: Diagnosis not present

## 2013-02-07 DIAGNOSIS — I6529 Occlusion and stenosis of unspecified carotid artery: Secondary | ICD-10-CM | POA: Diagnosis not present

## 2013-02-07 DIAGNOSIS — Z5189 Encounter for other specified aftercare: Secondary | ICD-10-CM | POA: Diagnosis not present

## 2013-02-09 DIAGNOSIS — F0390 Unspecified dementia without behavioral disturbance: Secondary | ICD-10-CM | POA: Diagnosis not present

## 2013-02-09 DIAGNOSIS — Z471 Aftercare following joint replacement surgery: Secondary | ICD-10-CM | POA: Diagnosis not present

## 2013-02-09 DIAGNOSIS — E119 Type 2 diabetes mellitus without complications: Secondary | ICD-10-CM | POA: Diagnosis not present

## 2013-02-09 DIAGNOSIS — R269 Unspecified abnormalities of gait and mobility: Secondary | ICD-10-CM | POA: Diagnosis not present

## 2013-02-09 DIAGNOSIS — Z96659 Presence of unspecified artificial knee joint: Secondary | ICD-10-CM | POA: Diagnosis not present

## 2013-02-09 DIAGNOSIS — Z5189 Encounter for other specified aftercare: Secondary | ICD-10-CM | POA: Diagnosis not present

## 2013-02-13 DIAGNOSIS — Z96659 Presence of unspecified artificial knee joint: Secondary | ICD-10-CM | POA: Diagnosis not present

## 2013-02-13 DIAGNOSIS — F0390 Unspecified dementia without behavioral disturbance: Secondary | ICD-10-CM | POA: Diagnosis not present

## 2013-02-13 DIAGNOSIS — Z5189 Encounter for other specified aftercare: Secondary | ICD-10-CM | POA: Diagnosis not present

## 2013-02-13 DIAGNOSIS — R269 Unspecified abnormalities of gait and mobility: Secondary | ICD-10-CM | POA: Diagnosis not present

## 2013-02-13 DIAGNOSIS — E119 Type 2 diabetes mellitus without complications: Secondary | ICD-10-CM | POA: Diagnosis not present

## 2013-02-13 DIAGNOSIS — Z471 Aftercare following joint replacement surgery: Secondary | ICD-10-CM | POA: Diagnosis not present

## 2013-02-15 DIAGNOSIS — Z5189 Encounter for other specified aftercare: Secondary | ICD-10-CM | POA: Diagnosis not present

## 2013-02-15 DIAGNOSIS — R269 Unspecified abnormalities of gait and mobility: Secondary | ICD-10-CM | POA: Diagnosis not present

## 2013-02-15 DIAGNOSIS — E119 Type 2 diabetes mellitus without complications: Secondary | ICD-10-CM | POA: Diagnosis not present

## 2013-02-15 DIAGNOSIS — F0390 Unspecified dementia without behavioral disturbance: Secondary | ICD-10-CM | POA: Diagnosis not present

## 2013-02-15 DIAGNOSIS — Z96659 Presence of unspecified artificial knee joint: Secondary | ICD-10-CM | POA: Diagnosis not present

## 2013-02-15 DIAGNOSIS — Z471 Aftercare following joint replacement surgery: Secondary | ICD-10-CM | POA: Diagnosis not present

## 2013-02-21 DIAGNOSIS — J3089 Other allergic rhinitis: Secondary | ICD-10-CM | POA: Diagnosis not present

## 2013-02-21 DIAGNOSIS — Z96659 Presence of unspecified artificial knee joint: Secondary | ICD-10-CM | POA: Diagnosis not present

## 2013-02-21 DIAGNOSIS — E119 Type 2 diabetes mellitus without complications: Secondary | ICD-10-CM | POA: Diagnosis not present

## 2013-02-21 DIAGNOSIS — I10 Essential (primary) hypertension: Secondary | ICD-10-CM | POA: Diagnosis not present

## 2013-02-21 DIAGNOSIS — Z471 Aftercare following joint replacement surgery: Secondary | ICD-10-CM | POA: Diagnosis not present

## 2013-02-21 DIAGNOSIS — E0590 Thyrotoxicosis, unspecified without thyrotoxic crisis or storm: Secondary | ICD-10-CM | POA: Diagnosis not present

## 2013-02-21 DIAGNOSIS — R5381 Other malaise: Secondary | ICD-10-CM | POA: Diagnosis not present

## 2013-02-21 DIAGNOSIS — Z5189 Encounter for other specified aftercare: Secondary | ICD-10-CM | POA: Diagnosis not present

## 2013-02-21 DIAGNOSIS — R269 Unspecified abnormalities of gait and mobility: Secondary | ICD-10-CM | POA: Diagnosis not present

## 2013-02-21 DIAGNOSIS — F0390 Unspecified dementia without behavioral disturbance: Secondary | ICD-10-CM | POA: Diagnosis not present

## 2013-02-22 DIAGNOSIS — E119 Type 2 diabetes mellitus without complications: Secondary | ICD-10-CM | POA: Diagnosis not present

## 2013-02-22 DIAGNOSIS — Z96659 Presence of unspecified artificial knee joint: Secondary | ICD-10-CM | POA: Diagnosis not present

## 2013-02-22 DIAGNOSIS — Z5189 Encounter for other specified aftercare: Secondary | ICD-10-CM | POA: Diagnosis not present

## 2013-02-22 DIAGNOSIS — R269 Unspecified abnormalities of gait and mobility: Secondary | ICD-10-CM | POA: Diagnosis not present

## 2013-02-22 DIAGNOSIS — Z471 Aftercare following joint replacement surgery: Secondary | ICD-10-CM | POA: Diagnosis not present

## 2013-02-22 DIAGNOSIS — F0390 Unspecified dementia without behavioral disturbance: Secondary | ICD-10-CM | POA: Diagnosis not present

## 2013-02-27 DIAGNOSIS — F0390 Unspecified dementia without behavioral disturbance: Secondary | ICD-10-CM | POA: Diagnosis not present

## 2013-02-27 DIAGNOSIS — Z96659 Presence of unspecified artificial knee joint: Secondary | ICD-10-CM | POA: Diagnosis not present

## 2013-02-27 DIAGNOSIS — Z471 Aftercare following joint replacement surgery: Secondary | ICD-10-CM | POA: Diagnosis not present

## 2013-02-27 DIAGNOSIS — E119 Type 2 diabetes mellitus without complications: Secondary | ICD-10-CM | POA: Diagnosis not present

## 2013-02-27 DIAGNOSIS — R269 Unspecified abnormalities of gait and mobility: Secondary | ICD-10-CM | POA: Diagnosis not present

## 2013-02-27 DIAGNOSIS — Z5189 Encounter for other specified aftercare: Secondary | ICD-10-CM | POA: Diagnosis not present

## 2013-03-01 DIAGNOSIS — Z96659 Presence of unspecified artificial knee joint: Secondary | ICD-10-CM | POA: Diagnosis not present

## 2013-03-01 DIAGNOSIS — Z471 Aftercare following joint replacement surgery: Secondary | ICD-10-CM | POA: Diagnosis not present

## 2013-03-01 DIAGNOSIS — R269 Unspecified abnormalities of gait and mobility: Secondary | ICD-10-CM | POA: Diagnosis not present

## 2013-03-01 DIAGNOSIS — E119 Type 2 diabetes mellitus without complications: Secondary | ICD-10-CM | POA: Diagnosis not present

## 2013-03-01 DIAGNOSIS — Z5189 Encounter for other specified aftercare: Secondary | ICD-10-CM | POA: Diagnosis not present

## 2013-03-01 DIAGNOSIS — F0390 Unspecified dementia without behavioral disturbance: Secondary | ICD-10-CM | POA: Diagnosis not present

## 2013-03-03 DIAGNOSIS — Z471 Aftercare following joint replacement surgery: Secondary | ICD-10-CM | POA: Diagnosis not present

## 2013-03-03 DIAGNOSIS — F0390 Unspecified dementia without behavioral disturbance: Secondary | ICD-10-CM | POA: Diagnosis not present

## 2013-03-03 DIAGNOSIS — Z9181 History of falling: Secondary | ICD-10-CM | POA: Diagnosis not present

## 2013-03-03 DIAGNOSIS — I10 Essential (primary) hypertension: Secondary | ICD-10-CM | POA: Diagnosis not present

## 2013-03-03 DIAGNOSIS — Z96659 Presence of unspecified artificial knee joint: Secondary | ICD-10-CM | POA: Diagnosis not present

## 2013-03-03 DIAGNOSIS — IMO0001 Reserved for inherently not codable concepts without codable children: Secondary | ICD-10-CM | POA: Diagnosis not present

## 2013-03-03 DIAGNOSIS — R269 Unspecified abnormalities of gait and mobility: Secondary | ICD-10-CM | POA: Diagnosis not present

## 2013-03-06 DIAGNOSIS — F0390 Unspecified dementia without behavioral disturbance: Secondary | ICD-10-CM | POA: Diagnosis not present

## 2013-03-06 DIAGNOSIS — I10 Essential (primary) hypertension: Secondary | ICD-10-CM | POA: Diagnosis not present

## 2013-03-06 DIAGNOSIS — R269 Unspecified abnormalities of gait and mobility: Secondary | ICD-10-CM | POA: Diagnosis not present

## 2013-03-07 DIAGNOSIS — R269 Unspecified abnormalities of gait and mobility: Secondary | ICD-10-CM | POA: Diagnosis not present

## 2013-03-07 DIAGNOSIS — F0390 Unspecified dementia without behavioral disturbance: Secondary | ICD-10-CM | POA: Diagnosis not present

## 2013-03-07 DIAGNOSIS — IMO0001 Reserved for inherently not codable concepts without codable children: Secondary | ICD-10-CM | POA: Diagnosis not present

## 2013-03-07 DIAGNOSIS — Z96659 Presence of unspecified artificial knee joint: Secondary | ICD-10-CM | POA: Diagnosis not present

## 2013-03-07 DIAGNOSIS — Z471 Aftercare following joint replacement surgery: Secondary | ICD-10-CM | POA: Diagnosis not present

## 2013-03-07 DIAGNOSIS — I10 Essential (primary) hypertension: Secondary | ICD-10-CM | POA: Diagnosis not present

## 2013-03-13 DIAGNOSIS — I10 Essential (primary) hypertension: Secondary | ICD-10-CM | POA: Diagnosis not present

## 2013-03-13 DIAGNOSIS — Z471 Aftercare following joint replacement surgery: Secondary | ICD-10-CM | POA: Diagnosis not present

## 2013-03-13 DIAGNOSIS — Z96659 Presence of unspecified artificial knee joint: Secondary | ICD-10-CM | POA: Diagnosis not present

## 2013-03-13 DIAGNOSIS — F0390 Unspecified dementia without behavioral disturbance: Secondary | ICD-10-CM | POA: Diagnosis not present

## 2013-03-13 DIAGNOSIS — R269 Unspecified abnormalities of gait and mobility: Secondary | ICD-10-CM | POA: Diagnosis not present

## 2013-03-13 DIAGNOSIS — IMO0001 Reserved for inherently not codable concepts without codable children: Secondary | ICD-10-CM | POA: Diagnosis not present

## 2013-03-15 DIAGNOSIS — Z471 Aftercare following joint replacement surgery: Secondary | ICD-10-CM | POA: Diagnosis not present

## 2013-03-15 DIAGNOSIS — F0390 Unspecified dementia without behavioral disturbance: Secondary | ICD-10-CM | POA: Diagnosis not present

## 2013-03-15 DIAGNOSIS — I10 Essential (primary) hypertension: Secondary | ICD-10-CM | POA: Diagnosis not present

## 2013-03-15 DIAGNOSIS — Z96659 Presence of unspecified artificial knee joint: Secondary | ICD-10-CM | POA: Diagnosis not present

## 2013-03-15 DIAGNOSIS — R269 Unspecified abnormalities of gait and mobility: Secondary | ICD-10-CM | POA: Diagnosis not present

## 2013-03-15 DIAGNOSIS — IMO0001 Reserved for inherently not codable concepts without codable children: Secondary | ICD-10-CM | POA: Diagnosis not present

## 2013-03-20 DIAGNOSIS — Z471 Aftercare following joint replacement surgery: Secondary | ICD-10-CM | POA: Diagnosis not present

## 2013-03-20 DIAGNOSIS — F0390 Unspecified dementia without behavioral disturbance: Secondary | ICD-10-CM | POA: Diagnosis not present

## 2013-03-20 DIAGNOSIS — IMO0001 Reserved for inherently not codable concepts without codable children: Secondary | ICD-10-CM | POA: Diagnosis not present

## 2013-03-20 DIAGNOSIS — Z96659 Presence of unspecified artificial knee joint: Secondary | ICD-10-CM | POA: Diagnosis not present

## 2013-03-20 DIAGNOSIS — I10 Essential (primary) hypertension: Secondary | ICD-10-CM | POA: Diagnosis not present

## 2013-03-20 DIAGNOSIS — R269 Unspecified abnormalities of gait and mobility: Secondary | ICD-10-CM | POA: Diagnosis not present

## 2013-03-22 DIAGNOSIS — R269 Unspecified abnormalities of gait and mobility: Secondary | ICD-10-CM | POA: Diagnosis not present

## 2013-03-22 DIAGNOSIS — F0390 Unspecified dementia without behavioral disturbance: Secondary | ICD-10-CM | POA: Diagnosis not present

## 2013-03-22 DIAGNOSIS — Z96659 Presence of unspecified artificial knee joint: Secondary | ICD-10-CM | POA: Diagnosis not present

## 2013-03-22 DIAGNOSIS — Z471 Aftercare following joint replacement surgery: Secondary | ICD-10-CM | POA: Diagnosis not present

## 2013-03-22 DIAGNOSIS — IMO0001 Reserved for inherently not codable concepts without codable children: Secondary | ICD-10-CM | POA: Diagnosis not present

## 2013-03-22 DIAGNOSIS — I10 Essential (primary) hypertension: Secondary | ICD-10-CM | POA: Diagnosis not present

## 2013-03-27 DIAGNOSIS — F0390 Unspecified dementia without behavioral disturbance: Secondary | ICD-10-CM | POA: Diagnosis not present

## 2013-03-27 DIAGNOSIS — R269 Unspecified abnormalities of gait and mobility: Secondary | ICD-10-CM | POA: Diagnosis not present

## 2013-03-27 DIAGNOSIS — IMO0001 Reserved for inherently not codable concepts without codable children: Secondary | ICD-10-CM | POA: Diagnosis not present

## 2013-03-27 DIAGNOSIS — I10 Essential (primary) hypertension: Secondary | ICD-10-CM | POA: Diagnosis not present

## 2013-03-27 DIAGNOSIS — Z471 Aftercare following joint replacement surgery: Secondary | ICD-10-CM | POA: Diagnosis not present

## 2013-03-27 DIAGNOSIS — Z96659 Presence of unspecified artificial knee joint: Secondary | ICD-10-CM | POA: Diagnosis not present

## 2013-03-29 DIAGNOSIS — Z471 Aftercare following joint replacement surgery: Secondary | ICD-10-CM | POA: Diagnosis not present

## 2013-03-29 DIAGNOSIS — IMO0001 Reserved for inherently not codable concepts without codable children: Secondary | ICD-10-CM | POA: Diagnosis not present

## 2013-03-29 DIAGNOSIS — Z96659 Presence of unspecified artificial knee joint: Secondary | ICD-10-CM | POA: Diagnosis not present

## 2013-03-29 DIAGNOSIS — I10 Essential (primary) hypertension: Secondary | ICD-10-CM | POA: Diagnosis not present

## 2013-03-29 DIAGNOSIS — R269 Unspecified abnormalities of gait and mobility: Secondary | ICD-10-CM | POA: Diagnosis not present

## 2013-03-29 DIAGNOSIS — F0390 Unspecified dementia without behavioral disturbance: Secondary | ICD-10-CM | POA: Diagnosis not present

## 2013-04-12 DIAGNOSIS — R52 Pain, unspecified: Secondary | ICD-10-CM | POA: Diagnosis not present

## 2013-04-13 ENCOUNTER — Inpatient Hospital Stay: Payer: Self-pay | Admitting: Student

## 2013-04-13 DIAGNOSIS — E559 Vitamin D deficiency, unspecified: Secondary | ICD-10-CM | POA: Diagnosis present

## 2013-04-13 DIAGNOSIS — L8992 Pressure ulcer of unspecified site, stage 2: Secondary | ICD-10-CM | POA: Diagnosis present

## 2013-04-13 DIAGNOSIS — I658 Occlusion and stenosis of other precerebral arteries: Secondary | ICD-10-CM | POA: Diagnosis not present

## 2013-04-13 DIAGNOSIS — E785 Hyperlipidemia, unspecified: Secondary | ICD-10-CM | POA: Diagnosis present

## 2013-04-13 DIAGNOSIS — I6529 Occlusion and stenosis of unspecified carotid artery: Secondary | ICD-10-CM | POA: Diagnosis not present

## 2013-04-13 DIAGNOSIS — K219 Gastro-esophageal reflux disease without esophagitis: Secondary | ICD-10-CM | POA: Diagnosis present

## 2013-04-13 DIAGNOSIS — Z7982 Long term (current) use of aspirin: Secondary | ICD-10-CM | POA: Diagnosis not present

## 2013-04-13 DIAGNOSIS — Z5189 Encounter for other specified aftercare: Secondary | ICD-10-CM | POA: Diagnosis not present

## 2013-04-13 DIAGNOSIS — E46 Unspecified protein-calorie malnutrition: Secondary | ICD-10-CM | POA: Diagnosis not present

## 2013-04-13 DIAGNOSIS — E119 Type 2 diabetes mellitus without complications: Secondary | ICD-10-CM | POA: Diagnosis not present

## 2013-04-13 DIAGNOSIS — R55 Syncope and collapse: Secondary | ICD-10-CM | POA: Diagnosis not present

## 2013-04-13 DIAGNOSIS — Z9181 History of falling: Secondary | ICD-10-CM | POA: Diagnosis not present

## 2013-04-13 DIAGNOSIS — Z7401 Bed confinement status: Secondary | ICD-10-CM | POA: Diagnosis not present

## 2013-04-13 DIAGNOSIS — I248 Other forms of acute ischemic heart disease: Secondary | ICD-10-CM | POA: Diagnosis not present

## 2013-04-13 DIAGNOSIS — S0990XA Unspecified injury of head, initial encounter: Secondary | ICD-10-CM | POA: Diagnosis not present

## 2013-04-13 DIAGNOSIS — Z7902 Long term (current) use of antithrombotics/antiplatelets: Secondary | ICD-10-CM | POA: Diagnosis not present

## 2013-04-13 DIAGNOSIS — R799 Abnormal finding of blood chemistry, unspecified: Secondary | ICD-10-CM | POA: Diagnosis not present

## 2013-04-13 DIAGNOSIS — IMO0002 Reserved for concepts with insufficient information to code with codable children: Secondary | ICD-10-CM | POA: Diagnosis not present

## 2013-04-13 DIAGNOSIS — R5381 Other malaise: Secondary | ICD-10-CM | POA: Diagnosis present

## 2013-04-13 DIAGNOSIS — E876 Hypokalemia: Secondary | ICD-10-CM | POA: Diagnosis not present

## 2013-04-13 DIAGNOSIS — D649 Anemia, unspecified: Secondary | ICD-10-CM | POA: Diagnosis present

## 2013-04-13 DIAGNOSIS — E0590 Thyrotoxicosis, unspecified without thyrotoxic crisis or storm: Secondary | ICD-10-CM | POA: Diagnosis not present

## 2013-04-13 DIAGNOSIS — F0390 Unspecified dementia without behavioral disturbance: Secondary | ICD-10-CM | POA: Diagnosis present

## 2013-04-13 DIAGNOSIS — R748 Abnormal levels of other serum enzymes: Secondary | ICD-10-CM | POA: Diagnosis not present

## 2013-04-13 DIAGNOSIS — R5383 Other fatigue: Secondary | ICD-10-CM | POA: Diagnosis present

## 2013-04-13 DIAGNOSIS — I10 Essential (primary) hypertension: Secondary | ICD-10-CM | POA: Diagnosis present

## 2013-04-13 DIAGNOSIS — L89109 Pressure ulcer of unspecified part of back, unspecified stage: Secondary | ICD-10-CM | POA: Diagnosis present

## 2013-04-13 LAB — URINALYSIS, COMPLETE
Bilirubin,UR: NEGATIVE
Blood: NEGATIVE
Glucose,UR: NEGATIVE mg/dL (ref 0–75)
Leukocyte Esterase: NEGATIVE
NITRITE: NEGATIVE
Ph: 5 (ref 4.5–8.0)
Protein: 30
RBC,UR: 1 /HPF (ref 0–5)
Specific Gravity: 1.02 (ref 1.003–1.030)
Squamous Epithelial: 1
WBC UR: 3 /HPF (ref 0–5)

## 2013-04-13 LAB — CK TOTAL AND CKMB (NOT AT ARMC)
CK, TOTAL: 35 U/L (ref 21–215)
CK, Total: 29 U/L (ref 21–215)
CK, Total: 36 U/L (ref 21–215)
CK-MB: 0.7 ng/mL (ref 0.5–3.6)
CK-MB: 0.8 ng/mL (ref 0.5–3.6)
CK-MB: 0.7 ng/mL (ref 0.5–3.6)

## 2013-04-13 LAB — CBC WITH DIFFERENTIAL/PLATELET
BASOS ABS: 0 10*3/uL (ref 0.0–0.1)
Basophil %: 0.6 %
EOS ABS: 0.2 10*3/uL (ref 0.0–0.7)
EOS PCT: 2.1 %
HCT: 29.8 % — AB (ref 35.0–47.0)
HGB: 10.2 g/dL — ABNORMAL LOW (ref 12.0–16.0)
Lymphocyte #: 1.6 10*3/uL (ref 1.0–3.6)
Lymphocyte %: 21.5 %
MCH: 29.8 pg (ref 26.0–34.0)
MCHC: 34.4 g/dL (ref 32.0–36.0)
MCV: 87 fL (ref 80–100)
MONOS PCT: 11.6 %
Monocyte #: 0.9 x10 3/mm (ref 0.2–0.9)
NEUTROS PCT: 64.2 %
Neutrophil #: 4.7 10*3/uL (ref 1.4–6.5)
Platelet: 225 10*3/uL (ref 150–440)
RBC: 3.43 10*6/uL — ABNORMAL LOW (ref 3.80–5.20)
RDW: 15.1 % — ABNORMAL HIGH (ref 11.5–14.5)
WBC: 7.3 10*3/uL (ref 3.6–11.0)

## 2013-04-13 LAB — COMPREHENSIVE METABOLIC PANEL
ALT: 18 U/L (ref 12–78)
Albumin: 2.6 g/dL — ABNORMAL LOW (ref 3.4–5.0)
Alkaline Phosphatase: 42 U/L — ABNORMAL LOW
Anion Gap: 1 — ABNORMAL LOW (ref 7–16)
BUN: 13 mg/dL (ref 7–18)
Bilirubin,Total: 0.3 mg/dL (ref 0.2–1.0)
CHLORIDE: 98 mmol/L (ref 98–107)
Calcium, Total: 9.6 mg/dL (ref 8.5–10.1)
Co2: 40 mmol/L (ref 21–32)
Creatinine: 0.84 mg/dL (ref 0.60–1.30)
Glucose: 140 mg/dL — ABNORMAL HIGH (ref 65–99)
Osmolality: 280 (ref 275–301)
POTASSIUM: 2.1 mmol/L — AB (ref 3.5–5.1)
SGOT(AST): 20 U/L (ref 15–37)
Sodium: 139 mmol/L (ref 136–145)
Total Protein: 5.4 g/dL — ABNORMAL LOW (ref 6.4–8.2)

## 2013-04-13 LAB — MAGNESIUM: Magnesium: 1.5 mg/dL — ABNORMAL LOW

## 2013-04-13 LAB — TROPONIN I
TROPONIN-I: 0.06 ng/mL — AB
TROPONIN-I: 0.05 ng/mL
Troponin-I: 0.06 ng/mL — ABNORMAL HIGH

## 2013-04-13 LAB — POTASSIUM: POTASSIUM: 2.6 mmol/L — AB (ref 3.5–5.1)

## 2013-04-14 LAB — CBC WITH DIFFERENTIAL/PLATELET
Basophil #: 0 10*3/uL (ref 0.0–0.1)
Basophil %: 0.1 %
Eosinophil #: 0.3 10*3/uL (ref 0.0–0.7)
Eosinophil %: 3.7 %
HCT: 30.1 % — ABNORMAL LOW (ref 35.0–47.0)
HGB: 10.2 g/dL — AB (ref 12.0–16.0)
LYMPHS ABS: 1.8 10*3/uL (ref 1.0–3.6)
Lymphocyte %: 23.6 %
MCH: 30.1 pg (ref 26.0–34.0)
MCHC: 34.1 g/dL (ref 32.0–36.0)
MCV: 88 fL (ref 80–100)
MONO ABS: 0.9 x10 3/mm (ref 0.2–0.9)
MONOS PCT: 11.3 %
Neutrophil #: 4.7 10*3/uL (ref 1.4–6.5)
Neutrophil %: 61.3 %
Platelet: 238 10*3/uL (ref 150–440)
RBC: 3.41 10*6/uL — AB (ref 3.80–5.20)
RDW: 15.3 % — ABNORMAL HIGH (ref 11.5–14.5)
WBC: 7.6 10*3/uL (ref 3.6–11.0)

## 2013-04-14 LAB — BASIC METABOLIC PANEL
ANION GAP: 2 — AB (ref 7–16)
BUN: 9 mg/dL (ref 7–18)
CALCIUM: 9.4 mg/dL (ref 8.5–10.1)
CREATININE: 0.63 mg/dL (ref 0.60–1.30)
Chloride: 103 mmol/L (ref 98–107)
Co2: 37 mmol/L — ABNORMAL HIGH (ref 21–32)
EGFR (African American): >60
GLUCOSE: 142 mg/dL — AB (ref 65–99)
Osmolality: 284 (ref 275–301)
POTASSIUM: 2.7 mmol/L — AB (ref 3.5–5.1)
Sodium: 142 mmol/L (ref 136–145)

## 2013-04-14 LAB — MAGNESIUM: Magnesium: 1.9 mg/dL

## 2013-04-15 LAB — POTASSIUM: Potassium: 3.5 mmol/L (ref 3.5–5.1)

## 2013-04-15 LAB — CLOSTRIDIUM DIFFICILE(ARMC)

## 2013-04-17 DIAGNOSIS — Z5189 Encounter for other specified aftercare: Secondary | ICD-10-CM | POA: Diagnosis not present

## 2013-04-17 DIAGNOSIS — E119 Type 2 diabetes mellitus without complications: Secondary | ICD-10-CM | POA: Diagnosis not present

## 2013-04-17 DIAGNOSIS — I6529 Occlusion and stenosis of unspecified carotid artery: Secondary | ICD-10-CM | POA: Diagnosis not present

## 2013-04-17 DIAGNOSIS — E0590 Thyrotoxicosis, unspecified without thyrotoxic crisis or storm: Secondary | ICD-10-CM | POA: Diagnosis not present

## 2013-04-17 DIAGNOSIS — D649 Anemia, unspecified: Secondary | ICD-10-CM | POA: Diagnosis not present

## 2013-04-17 DIAGNOSIS — R799 Abnormal finding of blood chemistry, unspecified: Secondary | ICD-10-CM | POA: Diagnosis not present

## 2013-04-17 DIAGNOSIS — Z9181 History of falling: Secondary | ICD-10-CM | POA: Diagnosis not present

## 2013-04-17 DIAGNOSIS — E785 Hyperlipidemia, unspecified: Secondary | ICD-10-CM | POA: Diagnosis not present

## 2013-04-17 DIAGNOSIS — K219 Gastro-esophageal reflux disease without esophagitis: Secondary | ICD-10-CM | POA: Diagnosis not present

## 2013-04-17 DIAGNOSIS — E876 Hypokalemia: Secondary | ICD-10-CM | POA: Diagnosis not present

## 2013-04-17 DIAGNOSIS — R55 Syncope and collapse: Secondary | ICD-10-CM | POA: Diagnosis not present

## 2013-04-17 DIAGNOSIS — F0390 Unspecified dementia without behavioral disturbance: Secondary | ICD-10-CM | POA: Diagnosis not present

## 2013-04-17 DIAGNOSIS — I10 Essential (primary) hypertension: Secondary | ICD-10-CM | POA: Diagnosis not present

## 2013-04-19 DIAGNOSIS — E876 Hypokalemia: Secondary | ICD-10-CM | POA: Diagnosis not present

## 2013-04-19 DIAGNOSIS — Z5189 Encounter for other specified aftercare: Secondary | ICD-10-CM | POA: Diagnosis not present

## 2013-04-19 DIAGNOSIS — F0390 Unspecified dementia without behavioral disturbance: Secondary | ICD-10-CM | POA: Diagnosis not present

## 2013-04-19 DIAGNOSIS — I10 Essential (primary) hypertension: Secondary | ICD-10-CM | POA: Diagnosis not present

## 2013-05-25 DIAGNOSIS — E119 Type 2 diabetes mellitus without complications: Secondary | ICD-10-CM | POA: Diagnosis not present

## 2013-05-25 DIAGNOSIS — R011 Cardiac murmur, unspecified: Secondary | ICD-10-CM | POA: Diagnosis not present

## 2013-05-25 DIAGNOSIS — R1084 Generalized abdominal pain: Secondary | ICD-10-CM | POA: Diagnosis not present

## 2013-05-25 DIAGNOSIS — R300 Dysuria: Secondary | ICD-10-CM | POA: Diagnosis not present

## 2013-05-25 DIAGNOSIS — K529 Noninfective gastroenteritis and colitis, unspecified: Secondary | ICD-10-CM | POA: Diagnosis not present

## 2013-05-25 DIAGNOSIS — N390 Urinary tract infection, site not specified: Secondary | ICD-10-CM | POA: Diagnosis not present

## 2013-05-25 DIAGNOSIS — R5381 Other malaise: Secondary | ICD-10-CM | POA: Diagnosis not present

## 2013-05-25 DIAGNOSIS — R5383 Other fatigue: Secondary | ICD-10-CM | POA: Diagnosis not present

## 2013-05-25 DIAGNOSIS — R197 Diarrhea, unspecified: Secondary | ICD-10-CM | POA: Diagnosis not present

## 2013-05-25 DIAGNOSIS — F0390 Unspecified dementia without behavioral disturbance: Secondary | ICD-10-CM | POA: Diagnosis not present

## 2013-05-30 DIAGNOSIS — Z96659 Presence of unspecified artificial knee joint: Secondary | ICD-10-CM | POA: Diagnosis not present

## 2013-05-30 DIAGNOSIS — I7090 Unspecified atherosclerosis: Secondary | ICD-10-CM | POA: Diagnosis not present

## 2013-05-30 DIAGNOSIS — F0390 Unspecified dementia without behavioral disturbance: Secondary | ICD-10-CM | POA: Diagnosis not present

## 2013-05-30 DIAGNOSIS — M6281 Muscle weakness (generalized): Secondary | ICD-10-CM | POA: Diagnosis not present

## 2013-05-30 DIAGNOSIS — R262 Difficulty in walking, not elsewhere classified: Secondary | ICD-10-CM | POA: Diagnosis not present

## 2013-05-30 DIAGNOSIS — E119 Type 2 diabetes mellitus without complications: Secondary | ICD-10-CM | POA: Diagnosis not present

## 2013-05-30 DIAGNOSIS — Z9181 History of falling: Secondary | ICD-10-CM | POA: Diagnosis not present

## 2013-05-30 DIAGNOSIS — I10 Essential (primary) hypertension: Secondary | ICD-10-CM | POA: Diagnosis not present

## 2013-05-31 DIAGNOSIS — F0390 Unspecified dementia without behavioral disturbance: Secondary | ICD-10-CM | POA: Diagnosis not present

## 2013-05-31 DIAGNOSIS — I10 Essential (primary) hypertension: Secondary | ICD-10-CM | POA: Diagnosis not present

## 2013-05-31 DIAGNOSIS — Z9181 History of falling: Secondary | ICD-10-CM | POA: Diagnosis not present

## 2013-05-31 DIAGNOSIS — E119 Type 2 diabetes mellitus without complications: Secondary | ICD-10-CM | POA: Diagnosis not present

## 2013-05-31 DIAGNOSIS — M6281 Muscle weakness (generalized): Secondary | ICD-10-CM | POA: Diagnosis not present

## 2013-05-31 DIAGNOSIS — R262 Difficulty in walking, not elsewhere classified: Secondary | ICD-10-CM | POA: Diagnosis not present

## 2013-06-04 DIAGNOSIS — R262 Difficulty in walking, not elsewhere classified: Secondary | ICD-10-CM | POA: Diagnosis not present

## 2013-06-04 DIAGNOSIS — M6281 Muscle weakness (generalized): Secondary | ICD-10-CM | POA: Diagnosis not present

## 2013-06-04 DIAGNOSIS — E119 Type 2 diabetes mellitus without complications: Secondary | ICD-10-CM | POA: Diagnosis not present

## 2013-06-04 DIAGNOSIS — I10 Essential (primary) hypertension: Secondary | ICD-10-CM | POA: Diagnosis not present

## 2013-06-04 DIAGNOSIS — Z9181 History of falling: Secondary | ICD-10-CM | POA: Diagnosis not present

## 2013-06-04 DIAGNOSIS — F0390 Unspecified dementia without behavioral disturbance: Secondary | ICD-10-CM | POA: Diagnosis not present

## 2013-06-05 DIAGNOSIS — I10 Essential (primary) hypertension: Secondary | ICD-10-CM | POA: Diagnosis not present

## 2013-06-05 DIAGNOSIS — M6281 Muscle weakness (generalized): Secondary | ICD-10-CM | POA: Diagnosis not present

## 2013-06-05 DIAGNOSIS — F0390 Unspecified dementia without behavioral disturbance: Secondary | ICD-10-CM | POA: Diagnosis not present

## 2013-06-05 DIAGNOSIS — E119 Type 2 diabetes mellitus without complications: Secondary | ICD-10-CM | POA: Diagnosis not present

## 2013-06-05 DIAGNOSIS — Z9181 History of falling: Secondary | ICD-10-CM | POA: Diagnosis not present

## 2013-06-05 DIAGNOSIS — R262 Difficulty in walking, not elsewhere classified: Secondary | ICD-10-CM | POA: Diagnosis not present

## 2013-06-06 DIAGNOSIS — F0390 Unspecified dementia without behavioral disturbance: Secondary | ICD-10-CM | POA: Diagnosis not present

## 2013-06-06 DIAGNOSIS — I7090 Unspecified atherosclerosis: Secondary | ICD-10-CM | POA: Diagnosis not present

## 2013-06-06 DIAGNOSIS — M6281 Muscle weakness (generalized): Secondary | ICD-10-CM | POA: Diagnosis not present

## 2013-06-06 DIAGNOSIS — R262 Difficulty in walking, not elsewhere classified: Secondary | ICD-10-CM | POA: Diagnosis not present

## 2013-06-06 DIAGNOSIS — I10 Essential (primary) hypertension: Secondary | ICD-10-CM | POA: Diagnosis not present

## 2013-06-06 DIAGNOSIS — E119 Type 2 diabetes mellitus without complications: Secondary | ICD-10-CM | POA: Diagnosis not present

## 2013-06-06 DIAGNOSIS — Z9181 History of falling: Secondary | ICD-10-CM | POA: Diagnosis not present

## 2013-06-07 DIAGNOSIS — R262 Difficulty in walking, not elsewhere classified: Secondary | ICD-10-CM | POA: Diagnosis not present

## 2013-06-07 DIAGNOSIS — Z9181 History of falling: Secondary | ICD-10-CM | POA: Diagnosis not present

## 2013-06-07 DIAGNOSIS — I10 Essential (primary) hypertension: Secondary | ICD-10-CM | POA: Diagnosis not present

## 2013-06-07 DIAGNOSIS — M6281 Muscle weakness (generalized): Secondary | ICD-10-CM | POA: Diagnosis not present

## 2013-06-07 DIAGNOSIS — E119 Type 2 diabetes mellitus without complications: Secondary | ICD-10-CM | POA: Diagnosis not present

## 2013-06-07 DIAGNOSIS — F0390 Unspecified dementia without behavioral disturbance: Secondary | ICD-10-CM | POA: Diagnosis not present

## 2013-06-09 DIAGNOSIS — R262 Difficulty in walking, not elsewhere classified: Secondary | ICD-10-CM | POA: Diagnosis not present

## 2013-06-09 DIAGNOSIS — M6281 Muscle weakness (generalized): Secondary | ICD-10-CM | POA: Diagnosis not present

## 2013-06-09 DIAGNOSIS — I10 Essential (primary) hypertension: Secondary | ICD-10-CM | POA: Diagnosis not present

## 2013-06-09 DIAGNOSIS — F0390 Unspecified dementia without behavioral disturbance: Secondary | ICD-10-CM | POA: Diagnosis not present

## 2013-06-09 DIAGNOSIS — E119 Type 2 diabetes mellitus without complications: Secondary | ICD-10-CM | POA: Diagnosis not present

## 2013-06-09 DIAGNOSIS — Z9181 History of falling: Secondary | ICD-10-CM | POA: Diagnosis not present

## 2013-06-11 DIAGNOSIS — E119 Type 2 diabetes mellitus without complications: Secondary | ICD-10-CM | POA: Diagnosis not present

## 2013-06-11 DIAGNOSIS — F0390 Unspecified dementia without behavioral disturbance: Secondary | ICD-10-CM | POA: Diagnosis not present

## 2013-06-11 DIAGNOSIS — Z9181 History of falling: Secondary | ICD-10-CM | POA: Diagnosis not present

## 2013-06-11 DIAGNOSIS — I10 Essential (primary) hypertension: Secondary | ICD-10-CM | POA: Diagnosis not present

## 2013-06-11 DIAGNOSIS — R262 Difficulty in walking, not elsewhere classified: Secondary | ICD-10-CM | POA: Diagnosis not present

## 2013-06-11 DIAGNOSIS — M6281 Muscle weakness (generalized): Secondary | ICD-10-CM | POA: Diagnosis not present

## 2013-06-12 DIAGNOSIS — Z9181 History of falling: Secondary | ICD-10-CM | POA: Diagnosis not present

## 2013-06-12 DIAGNOSIS — F0390 Unspecified dementia without behavioral disturbance: Secondary | ICD-10-CM | POA: Diagnosis not present

## 2013-06-12 DIAGNOSIS — M6281 Muscle weakness (generalized): Secondary | ICD-10-CM | POA: Diagnosis not present

## 2013-06-12 DIAGNOSIS — I10 Essential (primary) hypertension: Secondary | ICD-10-CM | POA: Diagnosis not present

## 2013-06-12 DIAGNOSIS — R262 Difficulty in walking, not elsewhere classified: Secondary | ICD-10-CM | POA: Diagnosis not present

## 2013-06-12 DIAGNOSIS — E119 Type 2 diabetes mellitus without complications: Secondary | ICD-10-CM | POA: Diagnosis not present

## 2013-06-13 DIAGNOSIS — I10 Essential (primary) hypertension: Secondary | ICD-10-CM | POA: Diagnosis not present

## 2013-06-13 DIAGNOSIS — Z9181 History of falling: Secondary | ICD-10-CM | POA: Diagnosis not present

## 2013-06-13 DIAGNOSIS — M6281 Muscle weakness (generalized): Secondary | ICD-10-CM | POA: Diagnosis not present

## 2013-06-13 DIAGNOSIS — R262 Difficulty in walking, not elsewhere classified: Secondary | ICD-10-CM | POA: Diagnosis not present

## 2013-06-13 DIAGNOSIS — E119 Type 2 diabetes mellitus without complications: Secondary | ICD-10-CM | POA: Diagnosis not present

## 2013-06-13 DIAGNOSIS — F0390 Unspecified dementia without behavioral disturbance: Secondary | ICD-10-CM | POA: Diagnosis not present

## 2013-06-14 DIAGNOSIS — I10 Essential (primary) hypertension: Secondary | ICD-10-CM | POA: Diagnosis not present

## 2013-06-14 DIAGNOSIS — Z9181 History of falling: Secondary | ICD-10-CM | POA: Diagnosis not present

## 2013-06-14 DIAGNOSIS — E119 Type 2 diabetes mellitus without complications: Secondary | ICD-10-CM | POA: Diagnosis not present

## 2013-06-14 DIAGNOSIS — M6281 Muscle weakness (generalized): Secondary | ICD-10-CM | POA: Diagnosis not present

## 2013-06-14 DIAGNOSIS — F0390 Unspecified dementia without behavioral disturbance: Secondary | ICD-10-CM | POA: Diagnosis not present

## 2013-06-14 DIAGNOSIS — R262 Difficulty in walking, not elsewhere classified: Secondary | ICD-10-CM | POA: Diagnosis not present

## 2013-06-16 DIAGNOSIS — I10 Essential (primary) hypertension: Secondary | ICD-10-CM | POA: Diagnosis not present

## 2013-06-16 DIAGNOSIS — M6281 Muscle weakness (generalized): Secondary | ICD-10-CM | POA: Diagnosis not present

## 2013-06-16 DIAGNOSIS — E119 Type 2 diabetes mellitus without complications: Secondary | ICD-10-CM | POA: Diagnosis not present

## 2013-06-16 DIAGNOSIS — F0390 Unspecified dementia without behavioral disturbance: Secondary | ICD-10-CM | POA: Diagnosis not present

## 2013-06-16 DIAGNOSIS — Z9181 History of falling: Secondary | ICD-10-CM | POA: Diagnosis not present

## 2013-06-16 DIAGNOSIS — R262 Difficulty in walking, not elsewhere classified: Secondary | ICD-10-CM | POA: Diagnosis not present

## 2013-06-18 DIAGNOSIS — F0390 Unspecified dementia without behavioral disturbance: Secondary | ICD-10-CM | POA: Diagnosis not present

## 2013-06-18 DIAGNOSIS — R262 Difficulty in walking, not elsewhere classified: Secondary | ICD-10-CM | POA: Diagnosis not present

## 2013-06-18 DIAGNOSIS — M6281 Muscle weakness (generalized): Secondary | ICD-10-CM | POA: Diagnosis not present

## 2013-06-18 DIAGNOSIS — Z9181 History of falling: Secondary | ICD-10-CM | POA: Diagnosis not present

## 2013-06-18 DIAGNOSIS — E119 Type 2 diabetes mellitus without complications: Secondary | ICD-10-CM | POA: Diagnosis not present

## 2013-06-18 DIAGNOSIS — I10 Essential (primary) hypertension: Secondary | ICD-10-CM | POA: Diagnosis not present

## 2013-06-20 DIAGNOSIS — M6281 Muscle weakness (generalized): Secondary | ICD-10-CM | POA: Diagnosis not present

## 2013-06-20 DIAGNOSIS — Z9181 History of falling: Secondary | ICD-10-CM | POA: Diagnosis not present

## 2013-06-20 DIAGNOSIS — F0390 Unspecified dementia without behavioral disturbance: Secondary | ICD-10-CM | POA: Diagnosis not present

## 2013-06-20 DIAGNOSIS — E119 Type 2 diabetes mellitus without complications: Secondary | ICD-10-CM | POA: Diagnosis not present

## 2013-06-20 DIAGNOSIS — R262 Difficulty in walking, not elsewhere classified: Secondary | ICD-10-CM | POA: Diagnosis not present

## 2013-06-20 DIAGNOSIS — I10 Essential (primary) hypertension: Secondary | ICD-10-CM | POA: Diagnosis not present

## 2013-06-25 DIAGNOSIS — E119 Type 2 diabetes mellitus without complications: Secondary | ICD-10-CM | POA: Diagnosis not present

## 2013-06-25 DIAGNOSIS — M6281 Muscle weakness (generalized): Secondary | ICD-10-CM | POA: Diagnosis not present

## 2013-06-25 DIAGNOSIS — I10 Essential (primary) hypertension: Secondary | ICD-10-CM | POA: Diagnosis not present

## 2013-06-25 DIAGNOSIS — R262 Difficulty in walking, not elsewhere classified: Secondary | ICD-10-CM | POA: Diagnosis not present

## 2013-06-25 DIAGNOSIS — Z9181 History of falling: Secondary | ICD-10-CM | POA: Diagnosis not present

## 2013-06-25 DIAGNOSIS — F0390 Unspecified dementia without behavioral disturbance: Secondary | ICD-10-CM | POA: Diagnosis not present

## 2013-06-26 DIAGNOSIS — F0390 Unspecified dementia without behavioral disturbance: Secondary | ICD-10-CM | POA: Diagnosis not present

## 2013-06-26 DIAGNOSIS — I10 Essential (primary) hypertension: Secondary | ICD-10-CM | POA: Diagnosis not present

## 2013-06-26 DIAGNOSIS — R262 Difficulty in walking, not elsewhere classified: Secondary | ICD-10-CM | POA: Diagnosis not present

## 2013-06-26 DIAGNOSIS — E119 Type 2 diabetes mellitus without complications: Secondary | ICD-10-CM | POA: Diagnosis not present

## 2013-06-26 DIAGNOSIS — M6281 Muscle weakness (generalized): Secondary | ICD-10-CM | POA: Diagnosis not present

## 2013-06-26 DIAGNOSIS — Z9181 History of falling: Secondary | ICD-10-CM | POA: Diagnosis not present

## 2013-06-28 DIAGNOSIS — F0390 Unspecified dementia without behavioral disturbance: Secondary | ICD-10-CM | POA: Diagnosis not present

## 2013-06-28 DIAGNOSIS — E119 Type 2 diabetes mellitus without complications: Secondary | ICD-10-CM | POA: Diagnosis not present

## 2013-06-28 DIAGNOSIS — Z9181 History of falling: Secondary | ICD-10-CM | POA: Diagnosis not present

## 2013-06-28 DIAGNOSIS — I10 Essential (primary) hypertension: Secondary | ICD-10-CM | POA: Diagnosis not present

## 2013-06-28 DIAGNOSIS — M6281 Muscle weakness (generalized): Secondary | ICD-10-CM | POA: Diagnosis not present

## 2013-06-28 DIAGNOSIS — R262 Difficulty in walking, not elsewhere classified: Secondary | ICD-10-CM | POA: Diagnosis not present

## 2013-07-02 DIAGNOSIS — Z9181 History of falling: Secondary | ICD-10-CM | POA: Diagnosis not present

## 2013-07-02 DIAGNOSIS — E119 Type 2 diabetes mellitus without complications: Secondary | ICD-10-CM | POA: Diagnosis not present

## 2013-07-02 DIAGNOSIS — F0390 Unspecified dementia without behavioral disturbance: Secondary | ICD-10-CM | POA: Diagnosis not present

## 2013-07-02 DIAGNOSIS — M159 Polyosteoarthritis, unspecified: Secondary | ICD-10-CM | POA: Diagnosis not present

## 2013-07-02 DIAGNOSIS — K219 Gastro-esophageal reflux disease without esophagitis: Secondary | ICD-10-CM | POA: Diagnosis not present

## 2013-07-02 DIAGNOSIS — E785 Hyperlipidemia, unspecified: Secondary | ICD-10-CM | POA: Diagnosis not present

## 2013-07-02 DIAGNOSIS — R269 Unspecified abnormalities of gait and mobility: Secondary | ICD-10-CM | POA: Diagnosis not present

## 2013-07-02 DIAGNOSIS — J3089 Other allergic rhinitis: Secondary | ICD-10-CM | POA: Diagnosis not present

## 2013-07-02 DIAGNOSIS — M6281 Muscle weakness (generalized): Secondary | ICD-10-CM | POA: Diagnosis not present

## 2013-07-02 DIAGNOSIS — I10 Essential (primary) hypertension: Secondary | ICD-10-CM | POA: Diagnosis not present

## 2013-07-02 DIAGNOSIS — R262 Difficulty in walking, not elsewhere classified: Secondary | ICD-10-CM | POA: Diagnosis not present

## 2013-07-04 DIAGNOSIS — Z9181 History of falling: Secondary | ICD-10-CM | POA: Diagnosis not present

## 2013-07-04 DIAGNOSIS — M6281 Muscle weakness (generalized): Secondary | ICD-10-CM | POA: Diagnosis not present

## 2013-07-04 DIAGNOSIS — E119 Type 2 diabetes mellitus without complications: Secondary | ICD-10-CM | POA: Diagnosis not present

## 2013-07-04 DIAGNOSIS — F0390 Unspecified dementia without behavioral disturbance: Secondary | ICD-10-CM | POA: Diagnosis not present

## 2013-07-04 DIAGNOSIS — R262 Difficulty in walking, not elsewhere classified: Secondary | ICD-10-CM | POA: Diagnosis not present

## 2013-07-04 DIAGNOSIS — I10 Essential (primary) hypertension: Secondary | ICD-10-CM | POA: Diagnosis not present

## 2013-07-05 DIAGNOSIS — Z9181 History of falling: Secondary | ICD-10-CM | POA: Diagnosis not present

## 2013-07-05 DIAGNOSIS — R262 Difficulty in walking, not elsewhere classified: Secondary | ICD-10-CM | POA: Diagnosis not present

## 2013-07-05 DIAGNOSIS — F0390 Unspecified dementia without behavioral disturbance: Secondary | ICD-10-CM | POA: Diagnosis not present

## 2013-07-05 DIAGNOSIS — E119 Type 2 diabetes mellitus without complications: Secondary | ICD-10-CM | POA: Diagnosis not present

## 2013-07-05 DIAGNOSIS — M6281 Muscle weakness (generalized): Secondary | ICD-10-CM | POA: Diagnosis not present

## 2013-07-05 DIAGNOSIS — I10 Essential (primary) hypertension: Secondary | ICD-10-CM | POA: Diagnosis not present

## 2013-07-09 DIAGNOSIS — R262 Difficulty in walking, not elsewhere classified: Secondary | ICD-10-CM | POA: Diagnosis not present

## 2013-07-09 DIAGNOSIS — M6281 Muscle weakness (generalized): Secondary | ICD-10-CM | POA: Diagnosis not present

## 2013-07-09 DIAGNOSIS — Z9181 History of falling: Secondary | ICD-10-CM | POA: Diagnosis not present

## 2013-07-09 DIAGNOSIS — F0390 Unspecified dementia without behavioral disturbance: Secondary | ICD-10-CM | POA: Diagnosis not present

## 2013-07-09 DIAGNOSIS — I10 Essential (primary) hypertension: Secondary | ICD-10-CM | POA: Diagnosis not present

## 2013-07-09 DIAGNOSIS — E119 Type 2 diabetes mellitus without complications: Secondary | ICD-10-CM | POA: Diagnosis not present

## 2013-07-11 DIAGNOSIS — M6281 Muscle weakness (generalized): Secondary | ICD-10-CM | POA: Diagnosis not present

## 2013-07-11 DIAGNOSIS — Z9181 History of falling: Secondary | ICD-10-CM | POA: Diagnosis not present

## 2013-07-11 DIAGNOSIS — F0390 Unspecified dementia without behavioral disturbance: Secondary | ICD-10-CM | POA: Diagnosis not present

## 2013-07-11 DIAGNOSIS — E119 Type 2 diabetes mellitus without complications: Secondary | ICD-10-CM | POA: Diagnosis not present

## 2013-07-11 DIAGNOSIS — R262 Difficulty in walking, not elsewhere classified: Secondary | ICD-10-CM | POA: Diagnosis not present

## 2013-07-11 DIAGNOSIS — I10 Essential (primary) hypertension: Secondary | ICD-10-CM | POA: Diagnosis not present

## 2013-07-16 DIAGNOSIS — N390 Urinary tract infection, site not specified: Secondary | ICD-10-CM | POA: Diagnosis not present

## 2013-07-16 DIAGNOSIS — E119 Type 2 diabetes mellitus without complications: Secondary | ICD-10-CM | POA: Diagnosis not present

## 2013-07-16 DIAGNOSIS — Z9181 History of falling: Secondary | ICD-10-CM | POA: Diagnosis not present

## 2013-07-16 DIAGNOSIS — M6281 Muscle weakness (generalized): Secondary | ICD-10-CM | POA: Diagnosis not present

## 2013-07-16 DIAGNOSIS — I10 Essential (primary) hypertension: Secondary | ICD-10-CM | POA: Diagnosis not present

## 2013-07-16 DIAGNOSIS — R262 Difficulty in walking, not elsewhere classified: Secondary | ICD-10-CM | POA: Diagnosis not present

## 2013-07-16 DIAGNOSIS — F0390 Unspecified dementia without behavioral disturbance: Secondary | ICD-10-CM | POA: Diagnosis not present

## 2013-07-18 DIAGNOSIS — H26499 Other secondary cataract, unspecified eye: Secondary | ICD-10-CM | POA: Diagnosis not present

## 2013-07-18 DIAGNOSIS — E119 Type 2 diabetes mellitus without complications: Secondary | ICD-10-CM | POA: Diagnosis not present

## 2013-07-18 DIAGNOSIS — Z961 Presence of intraocular lens: Secondary | ICD-10-CM | POA: Diagnosis not present

## 2013-07-18 DIAGNOSIS — H2510 Age-related nuclear cataract, unspecified eye: Secondary | ICD-10-CM | POA: Diagnosis not present

## 2013-07-19 DIAGNOSIS — I10 Essential (primary) hypertension: Secondary | ICD-10-CM | POA: Diagnosis not present

## 2013-07-19 DIAGNOSIS — R262 Difficulty in walking, not elsewhere classified: Secondary | ICD-10-CM | POA: Diagnosis not present

## 2013-07-19 DIAGNOSIS — M6281 Muscle weakness (generalized): Secondary | ICD-10-CM | POA: Diagnosis not present

## 2013-07-19 DIAGNOSIS — F0390 Unspecified dementia without behavioral disturbance: Secondary | ICD-10-CM | POA: Diagnosis not present

## 2013-07-19 DIAGNOSIS — Z9181 History of falling: Secondary | ICD-10-CM | POA: Diagnosis not present

## 2013-07-19 DIAGNOSIS — E119 Type 2 diabetes mellitus without complications: Secondary | ICD-10-CM | POA: Diagnosis not present

## 2013-07-24 DIAGNOSIS — M6281 Muscle weakness (generalized): Secondary | ICD-10-CM | POA: Diagnosis not present

## 2013-07-24 DIAGNOSIS — R262 Difficulty in walking, not elsewhere classified: Secondary | ICD-10-CM | POA: Diagnosis not present

## 2013-07-24 DIAGNOSIS — Z9181 History of falling: Secondary | ICD-10-CM | POA: Diagnosis not present

## 2013-07-24 DIAGNOSIS — E119 Type 2 diabetes mellitus without complications: Secondary | ICD-10-CM | POA: Diagnosis not present

## 2013-07-24 DIAGNOSIS — F0390 Unspecified dementia without behavioral disturbance: Secondary | ICD-10-CM | POA: Diagnosis not present

## 2013-07-24 DIAGNOSIS — I10 Essential (primary) hypertension: Secondary | ICD-10-CM | POA: Diagnosis not present

## 2013-07-26 DIAGNOSIS — F0390 Unspecified dementia without behavioral disturbance: Secondary | ICD-10-CM | POA: Diagnosis not present

## 2013-07-26 DIAGNOSIS — I10 Essential (primary) hypertension: Secondary | ICD-10-CM | POA: Diagnosis not present

## 2013-07-26 DIAGNOSIS — Z9181 History of falling: Secondary | ICD-10-CM | POA: Diagnosis not present

## 2013-07-26 DIAGNOSIS — M6281 Muscle weakness (generalized): Secondary | ICD-10-CM | POA: Diagnosis not present

## 2013-07-26 DIAGNOSIS — R262 Difficulty in walking, not elsewhere classified: Secondary | ICD-10-CM | POA: Diagnosis not present

## 2013-07-26 DIAGNOSIS — E119 Type 2 diabetes mellitus without complications: Secondary | ICD-10-CM | POA: Diagnosis not present

## 2013-07-29 DIAGNOSIS — E119 Type 2 diabetes mellitus without complications: Secondary | ICD-10-CM | POA: Diagnosis not present

## 2013-07-29 DIAGNOSIS — F0390 Unspecified dementia without behavioral disturbance: Secondary | ICD-10-CM | POA: Diagnosis not present

## 2013-07-29 DIAGNOSIS — IMO0001 Reserved for inherently not codable concepts without codable children: Secondary | ICD-10-CM | POA: Diagnosis not present

## 2013-07-29 DIAGNOSIS — I7090 Unspecified atherosclerosis: Secondary | ICD-10-CM | POA: Diagnosis not present

## 2013-07-29 DIAGNOSIS — M6281 Muscle weakness (generalized): Secondary | ICD-10-CM | POA: Diagnosis not present

## 2013-07-29 DIAGNOSIS — Z9181 History of falling: Secondary | ICD-10-CM | POA: Diagnosis not present

## 2013-07-29 DIAGNOSIS — Z96659 Presence of unspecified artificial knee joint: Secondary | ICD-10-CM | POA: Diagnosis not present

## 2013-07-29 DIAGNOSIS — I10 Essential (primary) hypertension: Secondary | ICD-10-CM | POA: Diagnosis not present

## 2013-07-30 DIAGNOSIS — I10 Essential (primary) hypertension: Secondary | ICD-10-CM | POA: Diagnosis not present

## 2013-07-30 DIAGNOSIS — E119 Type 2 diabetes mellitus without complications: Secondary | ICD-10-CM | POA: Diagnosis not present

## 2013-07-30 DIAGNOSIS — IMO0001 Reserved for inherently not codable concepts without codable children: Secondary | ICD-10-CM | POA: Diagnosis not present

## 2013-07-30 DIAGNOSIS — M6281 Muscle weakness (generalized): Secondary | ICD-10-CM | POA: Diagnosis not present

## 2013-07-30 DIAGNOSIS — F0390 Unspecified dementia without behavioral disturbance: Secondary | ICD-10-CM | POA: Diagnosis not present

## 2013-07-30 DIAGNOSIS — I7090 Unspecified atherosclerosis: Secondary | ICD-10-CM | POA: Diagnosis not present

## 2013-08-02 DIAGNOSIS — I7090 Unspecified atherosclerosis: Secondary | ICD-10-CM | POA: Diagnosis not present

## 2013-08-02 DIAGNOSIS — IMO0001 Reserved for inherently not codable concepts without codable children: Secondary | ICD-10-CM | POA: Diagnosis not present

## 2013-08-02 DIAGNOSIS — M6281 Muscle weakness (generalized): Secondary | ICD-10-CM | POA: Diagnosis not present

## 2013-08-02 DIAGNOSIS — E119 Type 2 diabetes mellitus without complications: Secondary | ICD-10-CM | POA: Diagnosis not present

## 2013-08-02 DIAGNOSIS — F0390 Unspecified dementia without behavioral disturbance: Secondary | ICD-10-CM | POA: Diagnosis not present

## 2013-08-02 DIAGNOSIS — I10 Essential (primary) hypertension: Secondary | ICD-10-CM | POA: Diagnosis not present

## 2013-08-06 DIAGNOSIS — Z96659 Presence of unspecified artificial knee joint: Secondary | ICD-10-CM | POA: Diagnosis not present

## 2013-08-06 DIAGNOSIS — M6281 Muscle weakness (generalized): Secondary | ICD-10-CM | POA: Diagnosis not present

## 2013-08-06 DIAGNOSIS — F0390 Unspecified dementia without behavioral disturbance: Secondary | ICD-10-CM | POA: Diagnosis not present

## 2013-08-06 DIAGNOSIS — I10 Essential (primary) hypertension: Secondary | ICD-10-CM | POA: Diagnosis not present

## 2013-08-06 DIAGNOSIS — E119 Type 2 diabetes mellitus without complications: Secondary | ICD-10-CM | POA: Diagnosis not present

## 2013-08-06 DIAGNOSIS — Z9181 History of falling: Secondary | ICD-10-CM | POA: Diagnosis not present

## 2013-08-06 DIAGNOSIS — I7090 Unspecified atherosclerosis: Secondary | ICD-10-CM | POA: Diagnosis not present

## 2013-08-07 DIAGNOSIS — F0390 Unspecified dementia without behavioral disturbance: Secondary | ICD-10-CM | POA: Diagnosis not present

## 2013-08-07 DIAGNOSIS — IMO0001 Reserved for inherently not codable concepts without codable children: Secondary | ICD-10-CM | POA: Diagnosis not present

## 2013-08-07 DIAGNOSIS — E119 Type 2 diabetes mellitus without complications: Secondary | ICD-10-CM | POA: Diagnosis not present

## 2013-08-07 DIAGNOSIS — M6281 Muscle weakness (generalized): Secondary | ICD-10-CM | POA: Diagnosis not present

## 2013-08-07 DIAGNOSIS — I7090 Unspecified atherosclerosis: Secondary | ICD-10-CM | POA: Diagnosis not present

## 2013-08-07 DIAGNOSIS — I10 Essential (primary) hypertension: Secondary | ICD-10-CM | POA: Diagnosis not present

## 2013-08-09 DIAGNOSIS — E119 Type 2 diabetes mellitus without complications: Secondary | ICD-10-CM | POA: Diagnosis not present

## 2013-08-09 DIAGNOSIS — M6281 Muscle weakness (generalized): Secondary | ICD-10-CM | POA: Diagnosis not present

## 2013-08-09 DIAGNOSIS — I7090 Unspecified atherosclerosis: Secondary | ICD-10-CM | POA: Diagnosis not present

## 2013-08-09 DIAGNOSIS — I10 Essential (primary) hypertension: Secondary | ICD-10-CM | POA: Diagnosis not present

## 2013-08-09 DIAGNOSIS — F0390 Unspecified dementia without behavioral disturbance: Secondary | ICD-10-CM | POA: Diagnosis not present

## 2013-08-09 DIAGNOSIS — IMO0001 Reserved for inherently not codable concepts without codable children: Secondary | ICD-10-CM | POA: Diagnosis not present

## 2013-08-14 DIAGNOSIS — M6281 Muscle weakness (generalized): Secondary | ICD-10-CM | POA: Diagnosis not present

## 2013-08-14 DIAGNOSIS — I7090 Unspecified atherosclerosis: Secondary | ICD-10-CM | POA: Diagnosis not present

## 2013-08-14 DIAGNOSIS — F0390 Unspecified dementia without behavioral disturbance: Secondary | ICD-10-CM | POA: Diagnosis not present

## 2013-08-14 DIAGNOSIS — E119 Type 2 diabetes mellitus without complications: Secondary | ICD-10-CM | POA: Diagnosis not present

## 2013-08-14 DIAGNOSIS — IMO0001 Reserved for inherently not codable concepts without codable children: Secondary | ICD-10-CM | POA: Diagnosis not present

## 2013-08-14 DIAGNOSIS — I10 Essential (primary) hypertension: Secondary | ICD-10-CM | POA: Diagnosis not present

## 2013-08-15 DIAGNOSIS — E119 Type 2 diabetes mellitus without complications: Secondary | ICD-10-CM | POA: Diagnosis not present

## 2013-08-15 DIAGNOSIS — I6529 Occlusion and stenosis of unspecified carotid artery: Secondary | ICD-10-CM | POA: Diagnosis not present

## 2013-08-15 DIAGNOSIS — E785 Hyperlipidemia, unspecified: Secondary | ICD-10-CM | POA: Diagnosis not present

## 2013-08-15 DIAGNOSIS — I10 Essential (primary) hypertension: Secondary | ICD-10-CM | POA: Diagnosis not present

## 2013-08-16 DIAGNOSIS — M6281 Muscle weakness (generalized): Secondary | ICD-10-CM | POA: Diagnosis not present

## 2013-08-16 DIAGNOSIS — IMO0001 Reserved for inherently not codable concepts without codable children: Secondary | ICD-10-CM | POA: Diagnosis not present

## 2013-08-16 DIAGNOSIS — E119 Type 2 diabetes mellitus without complications: Secondary | ICD-10-CM | POA: Diagnosis not present

## 2013-08-16 DIAGNOSIS — I7090 Unspecified atherosclerosis: Secondary | ICD-10-CM | POA: Diagnosis not present

## 2013-08-16 DIAGNOSIS — I10 Essential (primary) hypertension: Secondary | ICD-10-CM | POA: Diagnosis not present

## 2013-08-16 DIAGNOSIS — F0390 Unspecified dementia without behavioral disturbance: Secondary | ICD-10-CM | POA: Diagnosis not present

## 2013-08-21 DIAGNOSIS — M6281 Muscle weakness (generalized): Secondary | ICD-10-CM | POA: Diagnosis not present

## 2013-08-21 DIAGNOSIS — I7090 Unspecified atherosclerosis: Secondary | ICD-10-CM | POA: Diagnosis not present

## 2013-08-21 DIAGNOSIS — I10 Essential (primary) hypertension: Secondary | ICD-10-CM | POA: Diagnosis not present

## 2013-08-21 DIAGNOSIS — IMO0001 Reserved for inherently not codable concepts without codable children: Secondary | ICD-10-CM | POA: Diagnosis not present

## 2013-08-21 DIAGNOSIS — F0390 Unspecified dementia without behavioral disturbance: Secondary | ICD-10-CM | POA: Diagnosis not present

## 2013-08-21 DIAGNOSIS — E119 Type 2 diabetes mellitus without complications: Secondary | ICD-10-CM | POA: Diagnosis not present

## 2013-08-23 DIAGNOSIS — E119 Type 2 diabetes mellitus without complications: Secondary | ICD-10-CM | POA: Diagnosis not present

## 2013-08-23 DIAGNOSIS — F0390 Unspecified dementia without behavioral disturbance: Secondary | ICD-10-CM | POA: Diagnosis not present

## 2013-08-23 DIAGNOSIS — I7090 Unspecified atherosclerosis: Secondary | ICD-10-CM | POA: Diagnosis not present

## 2013-08-23 DIAGNOSIS — IMO0001 Reserved for inherently not codable concepts without codable children: Secondary | ICD-10-CM | POA: Diagnosis not present

## 2013-08-23 DIAGNOSIS — M6281 Muscle weakness (generalized): Secondary | ICD-10-CM | POA: Diagnosis not present

## 2013-08-23 DIAGNOSIS — I10 Essential (primary) hypertension: Secondary | ICD-10-CM | POA: Diagnosis not present

## 2013-08-28 DIAGNOSIS — I7090 Unspecified atherosclerosis: Secondary | ICD-10-CM | POA: Diagnosis not present

## 2013-08-28 DIAGNOSIS — F0390 Unspecified dementia without behavioral disturbance: Secondary | ICD-10-CM | POA: Diagnosis not present

## 2013-08-28 DIAGNOSIS — I10 Essential (primary) hypertension: Secondary | ICD-10-CM | POA: Diagnosis not present

## 2013-08-28 DIAGNOSIS — M6281 Muscle weakness (generalized): Secondary | ICD-10-CM | POA: Diagnosis not present

## 2013-08-28 DIAGNOSIS — IMO0001 Reserved for inherently not codable concepts without codable children: Secondary | ICD-10-CM | POA: Diagnosis not present

## 2013-08-28 DIAGNOSIS — E119 Type 2 diabetes mellitus without complications: Secondary | ICD-10-CM | POA: Diagnosis not present

## 2013-08-30 DIAGNOSIS — IMO0001 Reserved for inherently not codable concepts without codable children: Secondary | ICD-10-CM | POA: Diagnosis not present

## 2013-08-30 DIAGNOSIS — M6281 Muscle weakness (generalized): Secondary | ICD-10-CM | POA: Diagnosis not present

## 2013-08-30 DIAGNOSIS — F0390 Unspecified dementia without behavioral disturbance: Secondary | ICD-10-CM | POA: Diagnosis not present

## 2013-08-30 DIAGNOSIS — I10 Essential (primary) hypertension: Secondary | ICD-10-CM | POA: Diagnosis not present

## 2013-08-30 DIAGNOSIS — E119 Type 2 diabetes mellitus without complications: Secondary | ICD-10-CM | POA: Diagnosis not present

## 2013-08-30 DIAGNOSIS — I7090 Unspecified atherosclerosis: Secondary | ICD-10-CM | POA: Diagnosis not present

## 2013-08-31 DIAGNOSIS — H6120 Impacted cerumen, unspecified ear: Secondary | ICD-10-CM | POA: Diagnosis not present

## 2013-08-31 DIAGNOSIS — J309 Allergic rhinitis, unspecified: Secondary | ICD-10-CM | POA: Diagnosis not present

## 2013-08-31 DIAGNOSIS — H903 Sensorineural hearing loss, bilateral: Secondary | ICD-10-CM | POA: Diagnosis not present

## 2013-09-04 DIAGNOSIS — E119 Type 2 diabetes mellitus without complications: Secondary | ICD-10-CM | POA: Diagnosis not present

## 2013-09-04 DIAGNOSIS — IMO0001 Reserved for inherently not codable concepts without codable children: Secondary | ICD-10-CM | POA: Diagnosis not present

## 2013-09-04 DIAGNOSIS — F0390 Unspecified dementia without behavioral disturbance: Secondary | ICD-10-CM | POA: Diagnosis not present

## 2013-09-04 DIAGNOSIS — M6281 Muscle weakness (generalized): Secondary | ICD-10-CM | POA: Diagnosis not present

## 2013-09-04 DIAGNOSIS — I7090 Unspecified atherosclerosis: Secondary | ICD-10-CM | POA: Diagnosis not present

## 2013-09-04 DIAGNOSIS — I10 Essential (primary) hypertension: Secondary | ICD-10-CM | POA: Diagnosis not present

## 2013-09-06 DIAGNOSIS — E119 Type 2 diabetes mellitus without complications: Secondary | ICD-10-CM | POA: Diagnosis not present

## 2013-09-06 DIAGNOSIS — I7090 Unspecified atherosclerosis: Secondary | ICD-10-CM | POA: Diagnosis not present

## 2013-09-06 DIAGNOSIS — IMO0001 Reserved for inherently not codable concepts without codable children: Secondary | ICD-10-CM | POA: Diagnosis not present

## 2013-09-06 DIAGNOSIS — I10 Essential (primary) hypertension: Secondary | ICD-10-CM | POA: Diagnosis not present

## 2013-09-06 DIAGNOSIS — F0390 Unspecified dementia without behavioral disturbance: Secondary | ICD-10-CM | POA: Diagnosis not present

## 2013-09-06 DIAGNOSIS — M6281 Muscle weakness (generalized): Secondary | ICD-10-CM | POA: Diagnosis not present

## 2013-09-11 DIAGNOSIS — I7090 Unspecified atherosclerosis: Secondary | ICD-10-CM | POA: Diagnosis not present

## 2013-09-11 DIAGNOSIS — IMO0001 Reserved for inherently not codable concepts without codable children: Secondary | ICD-10-CM | POA: Diagnosis not present

## 2013-09-11 DIAGNOSIS — E119 Type 2 diabetes mellitus without complications: Secondary | ICD-10-CM | POA: Diagnosis not present

## 2013-09-11 DIAGNOSIS — M6281 Muscle weakness (generalized): Secondary | ICD-10-CM | POA: Diagnosis not present

## 2013-09-11 DIAGNOSIS — F0390 Unspecified dementia without behavioral disturbance: Secondary | ICD-10-CM | POA: Diagnosis not present

## 2013-09-11 DIAGNOSIS — I10 Essential (primary) hypertension: Secondary | ICD-10-CM | POA: Diagnosis not present

## 2013-09-13 DIAGNOSIS — IMO0001 Reserved for inherently not codable concepts without codable children: Secondary | ICD-10-CM | POA: Diagnosis not present

## 2013-09-13 DIAGNOSIS — E119 Type 2 diabetes mellitus without complications: Secondary | ICD-10-CM | POA: Diagnosis not present

## 2013-09-13 DIAGNOSIS — F0390 Unspecified dementia without behavioral disturbance: Secondary | ICD-10-CM | POA: Diagnosis not present

## 2013-09-13 DIAGNOSIS — I10 Essential (primary) hypertension: Secondary | ICD-10-CM | POA: Diagnosis not present

## 2013-09-13 DIAGNOSIS — M6281 Muscle weakness (generalized): Secondary | ICD-10-CM | POA: Diagnosis not present

## 2013-09-13 DIAGNOSIS — I7090 Unspecified atherosclerosis: Secondary | ICD-10-CM | POA: Diagnosis not present

## 2013-09-18 DIAGNOSIS — E119 Type 2 diabetes mellitus without complications: Secondary | ICD-10-CM | POA: Diagnosis not present

## 2013-09-18 DIAGNOSIS — IMO0001 Reserved for inherently not codable concepts without codable children: Secondary | ICD-10-CM | POA: Diagnosis not present

## 2013-09-18 DIAGNOSIS — F0390 Unspecified dementia without behavioral disturbance: Secondary | ICD-10-CM | POA: Diagnosis not present

## 2013-09-18 DIAGNOSIS — I7090 Unspecified atherosclerosis: Secondary | ICD-10-CM | POA: Diagnosis not present

## 2013-09-18 DIAGNOSIS — I10 Essential (primary) hypertension: Secondary | ICD-10-CM | POA: Diagnosis not present

## 2013-09-18 DIAGNOSIS — M6281 Muscle weakness (generalized): Secondary | ICD-10-CM | POA: Diagnosis not present

## 2013-09-21 DIAGNOSIS — E119 Type 2 diabetes mellitus without complications: Secondary | ICD-10-CM | POA: Diagnosis not present

## 2013-09-21 DIAGNOSIS — F0390 Unspecified dementia without behavioral disturbance: Secondary | ICD-10-CM | POA: Diagnosis not present

## 2013-09-21 DIAGNOSIS — M6281 Muscle weakness (generalized): Secondary | ICD-10-CM | POA: Diagnosis not present

## 2013-09-21 DIAGNOSIS — I7090 Unspecified atherosclerosis: Secondary | ICD-10-CM | POA: Diagnosis not present

## 2013-09-21 DIAGNOSIS — IMO0001 Reserved for inherently not codable concepts without codable children: Secondary | ICD-10-CM | POA: Diagnosis not present

## 2013-09-21 DIAGNOSIS — I10 Essential (primary) hypertension: Secondary | ICD-10-CM | POA: Diagnosis not present

## 2013-09-25 DIAGNOSIS — M6281 Muscle weakness (generalized): Secondary | ICD-10-CM | POA: Diagnosis not present

## 2013-09-25 DIAGNOSIS — E119 Type 2 diabetes mellitus without complications: Secondary | ICD-10-CM | POA: Diagnosis not present

## 2013-09-25 DIAGNOSIS — I10 Essential (primary) hypertension: Secondary | ICD-10-CM | POA: Diagnosis not present

## 2013-09-25 DIAGNOSIS — IMO0001 Reserved for inherently not codable concepts without codable children: Secondary | ICD-10-CM | POA: Diagnosis not present

## 2013-09-25 DIAGNOSIS — F0390 Unspecified dementia without behavioral disturbance: Secondary | ICD-10-CM | POA: Diagnosis not present

## 2013-09-25 DIAGNOSIS — I7090 Unspecified atherosclerosis: Secondary | ICD-10-CM | POA: Diagnosis not present

## 2013-09-27 DIAGNOSIS — Z96659 Presence of unspecified artificial knee joint: Secondary | ICD-10-CM | POA: Diagnosis not present

## 2013-09-27 DIAGNOSIS — E119 Type 2 diabetes mellitus without complications: Secondary | ICD-10-CM | POA: Diagnosis not present

## 2013-09-27 DIAGNOSIS — M6281 Muscle weakness (generalized): Secondary | ICD-10-CM | POA: Diagnosis not present

## 2013-09-27 DIAGNOSIS — I7090 Unspecified atherosclerosis: Secondary | ICD-10-CM | POA: Diagnosis not present

## 2013-09-27 DIAGNOSIS — Z9181 History of falling: Secondary | ICD-10-CM | POA: Diagnosis not present

## 2013-09-27 DIAGNOSIS — I10 Essential (primary) hypertension: Secondary | ICD-10-CM | POA: Diagnosis not present

## 2013-09-27 DIAGNOSIS — F0390 Unspecified dementia without behavioral disturbance: Secondary | ICD-10-CM | POA: Diagnosis not present

## 2013-09-27 DIAGNOSIS — IMO0001 Reserved for inherently not codable concepts without codable children: Secondary | ICD-10-CM | POA: Diagnosis not present

## 2013-10-01 DIAGNOSIS — I7090 Unspecified atherosclerosis: Secondary | ICD-10-CM | POA: Diagnosis not present

## 2013-10-01 DIAGNOSIS — F0390 Unspecified dementia without behavioral disturbance: Secondary | ICD-10-CM | POA: Diagnosis not present

## 2013-10-01 DIAGNOSIS — M6281 Muscle weakness (generalized): Secondary | ICD-10-CM | POA: Diagnosis not present

## 2013-10-01 DIAGNOSIS — I10 Essential (primary) hypertension: Secondary | ICD-10-CM | POA: Diagnosis not present

## 2013-10-01 DIAGNOSIS — IMO0001 Reserved for inherently not codable concepts without codable children: Secondary | ICD-10-CM | POA: Diagnosis not present

## 2013-10-01 DIAGNOSIS — E119 Type 2 diabetes mellitus without complications: Secondary | ICD-10-CM | POA: Diagnosis not present

## 2013-10-04 DIAGNOSIS — IMO0001 Reserved for inherently not codable concepts without codable children: Secondary | ICD-10-CM | POA: Diagnosis not present

## 2013-10-04 DIAGNOSIS — E119 Type 2 diabetes mellitus without complications: Secondary | ICD-10-CM | POA: Diagnosis not present

## 2013-10-04 DIAGNOSIS — I7090 Unspecified atherosclerosis: Secondary | ICD-10-CM | POA: Diagnosis not present

## 2013-10-04 DIAGNOSIS — M6281 Muscle weakness (generalized): Secondary | ICD-10-CM | POA: Diagnosis not present

## 2013-10-04 DIAGNOSIS — I10 Essential (primary) hypertension: Secondary | ICD-10-CM | POA: Diagnosis not present

## 2013-10-04 DIAGNOSIS — F0390 Unspecified dementia without behavioral disturbance: Secondary | ICD-10-CM | POA: Diagnosis not present

## 2013-10-05 DIAGNOSIS — I10 Essential (primary) hypertension: Secondary | ICD-10-CM | POA: Diagnosis not present

## 2013-10-05 DIAGNOSIS — Z96659 Presence of unspecified artificial knee joint: Secondary | ICD-10-CM | POA: Diagnosis not present

## 2013-10-05 DIAGNOSIS — M6281 Muscle weakness (generalized): Secondary | ICD-10-CM | POA: Diagnosis not present

## 2013-10-05 DIAGNOSIS — F0390 Unspecified dementia without behavioral disturbance: Secondary | ICD-10-CM | POA: Diagnosis not present

## 2013-10-05 DIAGNOSIS — I7090 Unspecified atherosclerosis: Secondary | ICD-10-CM | POA: Diagnosis not present

## 2013-10-05 DIAGNOSIS — Z9181 History of falling: Secondary | ICD-10-CM | POA: Diagnosis not present

## 2013-10-05 DIAGNOSIS — E119 Type 2 diabetes mellitus without complications: Secondary | ICD-10-CM | POA: Diagnosis not present

## 2013-10-08 DIAGNOSIS — I10 Essential (primary) hypertension: Secondary | ICD-10-CM | POA: Diagnosis not present

## 2013-10-08 DIAGNOSIS — F0390 Unspecified dementia without behavioral disturbance: Secondary | ICD-10-CM | POA: Diagnosis not present

## 2013-10-08 DIAGNOSIS — I7090 Unspecified atherosclerosis: Secondary | ICD-10-CM | POA: Diagnosis not present

## 2013-10-08 DIAGNOSIS — E119 Type 2 diabetes mellitus without complications: Secondary | ICD-10-CM | POA: Diagnosis not present

## 2013-10-08 DIAGNOSIS — IMO0001 Reserved for inherently not codable concepts without codable children: Secondary | ICD-10-CM | POA: Diagnosis not present

## 2013-10-08 DIAGNOSIS — M6281 Muscle weakness (generalized): Secondary | ICD-10-CM | POA: Diagnosis not present

## 2013-10-12 DIAGNOSIS — I7090 Unspecified atherosclerosis: Secondary | ICD-10-CM | POA: Diagnosis not present

## 2013-10-12 DIAGNOSIS — E119 Type 2 diabetes mellitus without complications: Secondary | ICD-10-CM | POA: Diagnosis not present

## 2013-10-12 DIAGNOSIS — M6281 Muscle weakness (generalized): Secondary | ICD-10-CM | POA: Diagnosis not present

## 2013-10-12 DIAGNOSIS — I10 Essential (primary) hypertension: Secondary | ICD-10-CM | POA: Diagnosis not present

## 2013-10-12 DIAGNOSIS — F0390 Unspecified dementia without behavioral disturbance: Secondary | ICD-10-CM | POA: Diagnosis not present

## 2013-10-12 DIAGNOSIS — IMO0001 Reserved for inherently not codable concepts without codable children: Secondary | ICD-10-CM | POA: Diagnosis not present

## 2013-10-23 DIAGNOSIS — E119 Type 2 diabetes mellitus without complications: Secondary | ICD-10-CM | POA: Diagnosis not present

## 2013-10-23 DIAGNOSIS — I10 Essential (primary) hypertension: Secondary | ICD-10-CM | POA: Diagnosis not present

## 2013-10-23 DIAGNOSIS — I7090 Unspecified atherosclerosis: Secondary | ICD-10-CM | POA: Diagnosis not present

## 2013-10-23 DIAGNOSIS — M6281 Muscle weakness (generalized): Secondary | ICD-10-CM | POA: Diagnosis not present

## 2013-10-23 DIAGNOSIS — F0390 Unspecified dementia without behavioral disturbance: Secondary | ICD-10-CM | POA: Diagnosis not present

## 2013-10-23 DIAGNOSIS — IMO0001 Reserved for inherently not codable concepts without codable children: Secondary | ICD-10-CM | POA: Diagnosis not present

## 2013-10-26 DIAGNOSIS — F0390 Unspecified dementia without behavioral disturbance: Secondary | ICD-10-CM | POA: Diagnosis not present

## 2013-10-26 DIAGNOSIS — I7090 Unspecified atherosclerosis: Secondary | ICD-10-CM | POA: Diagnosis not present

## 2013-10-26 DIAGNOSIS — I10 Essential (primary) hypertension: Secondary | ICD-10-CM | POA: Diagnosis not present

## 2013-10-26 DIAGNOSIS — IMO0001 Reserved for inherently not codable concepts without codable children: Secondary | ICD-10-CM | POA: Diagnosis not present

## 2013-10-26 DIAGNOSIS — M6281 Muscle weakness (generalized): Secondary | ICD-10-CM | POA: Diagnosis not present

## 2013-10-26 DIAGNOSIS — E119 Type 2 diabetes mellitus without complications: Secondary | ICD-10-CM | POA: Diagnosis not present

## 2013-10-29 DIAGNOSIS — E119 Type 2 diabetes mellitus without complications: Secondary | ICD-10-CM | POA: Diagnosis not present

## 2013-10-29 DIAGNOSIS — I10 Essential (primary) hypertension: Secondary | ICD-10-CM | POA: Diagnosis not present

## 2013-10-29 DIAGNOSIS — M6281 Muscle weakness (generalized): Secondary | ICD-10-CM | POA: Diagnosis not present

## 2013-10-29 DIAGNOSIS — IMO0001 Reserved for inherently not codable concepts without codable children: Secondary | ICD-10-CM | POA: Diagnosis not present

## 2013-10-29 DIAGNOSIS — F0390 Unspecified dementia without behavioral disturbance: Secondary | ICD-10-CM | POA: Diagnosis not present

## 2013-10-29 DIAGNOSIS — I7090 Unspecified atherosclerosis: Secondary | ICD-10-CM | POA: Diagnosis not present

## 2013-12-19 DIAGNOSIS — J069 Acute upper respiratory infection, unspecified: Secondary | ICD-10-CM | POA: Diagnosis not present

## 2013-12-19 DIAGNOSIS — E119 Type 2 diabetes mellitus without complications: Secondary | ICD-10-CM | POA: Diagnosis not present

## 2013-12-19 DIAGNOSIS — J309 Allergic rhinitis, unspecified: Secondary | ICD-10-CM | POA: Diagnosis not present

## 2014-01-09 DIAGNOSIS — R5383 Other fatigue: Secondary | ICD-10-CM | POA: Diagnosis not present

## 2014-01-09 DIAGNOSIS — J069 Acute upper respiratory infection, unspecified: Secondary | ICD-10-CM | POA: Diagnosis not present

## 2014-01-09 DIAGNOSIS — J309 Allergic rhinitis, unspecified: Secondary | ICD-10-CM | POA: Diagnosis not present

## 2014-01-09 DIAGNOSIS — D518 Other vitamin B12 deficiency anemias: Secondary | ICD-10-CM | POA: Diagnosis not present

## 2014-01-09 DIAGNOSIS — E038 Other specified hypothyroidism: Secondary | ICD-10-CM | POA: Diagnosis not present

## 2014-01-09 DIAGNOSIS — I10 Essential (primary) hypertension: Secondary | ICD-10-CM | POA: Diagnosis not present

## 2014-01-09 DIAGNOSIS — F0390 Unspecified dementia without behavioral disturbance: Secondary | ICD-10-CM | POA: Diagnosis not present

## 2014-01-09 DIAGNOSIS — Z23 Encounter for immunization: Secondary | ICD-10-CM | POA: Diagnosis not present

## 2014-01-09 DIAGNOSIS — E890 Postprocedural hypothyroidism: Secondary | ICD-10-CM | POA: Diagnosis not present

## 2014-01-09 DIAGNOSIS — M6281 Muscle weakness (generalized): Secondary | ICD-10-CM | POA: Diagnosis not present

## 2014-01-09 DIAGNOSIS — R5381 Other malaise: Secondary | ICD-10-CM | POA: Diagnosis not present

## 2014-01-16 DIAGNOSIS — E538 Deficiency of other specified B group vitamins: Secondary | ICD-10-CM | POA: Diagnosis not present

## 2014-01-16 DIAGNOSIS — D508 Other iron deficiency anemias: Secondary | ICD-10-CM | POA: Diagnosis not present

## 2014-01-23 DIAGNOSIS — M6281 Muscle weakness (generalized): Secondary | ICD-10-CM | POA: Diagnosis not present

## 2014-01-23 DIAGNOSIS — Z9181 History of falling: Secondary | ICD-10-CM | POA: Diagnosis not present

## 2014-01-23 DIAGNOSIS — F0390 Unspecified dementia without behavioral disturbance: Secondary | ICD-10-CM | POA: Diagnosis not present

## 2014-01-23 DIAGNOSIS — E119 Type 2 diabetes mellitus without complications: Secondary | ICD-10-CM | POA: Diagnosis not present

## 2014-01-23 DIAGNOSIS — F329 Major depressive disorder, single episode, unspecified: Secondary | ICD-10-CM | POA: Diagnosis not present

## 2014-01-23 DIAGNOSIS — I10 Essential (primary) hypertension: Secondary | ICD-10-CM | POA: Diagnosis not present

## 2014-01-23 DIAGNOSIS — M150 Primary generalized (osteo)arthritis: Secondary | ICD-10-CM | POA: Diagnosis not present

## 2014-01-24 DIAGNOSIS — E119 Type 2 diabetes mellitus without complications: Secondary | ICD-10-CM | POA: Diagnosis not present

## 2014-01-24 DIAGNOSIS — M150 Primary generalized (osteo)arthritis: Secondary | ICD-10-CM | POA: Diagnosis not present

## 2014-01-24 DIAGNOSIS — M6281 Muscle weakness (generalized): Secondary | ICD-10-CM | POA: Diagnosis not present

## 2014-01-24 DIAGNOSIS — F0390 Unspecified dementia without behavioral disturbance: Secondary | ICD-10-CM | POA: Diagnosis not present

## 2014-01-24 DIAGNOSIS — F329 Major depressive disorder, single episode, unspecified: Secondary | ICD-10-CM | POA: Diagnosis not present

## 2014-01-24 DIAGNOSIS — I10 Essential (primary) hypertension: Secondary | ICD-10-CM | POA: Diagnosis not present

## 2014-01-28 DIAGNOSIS — M6281 Muscle weakness (generalized): Secondary | ICD-10-CM | POA: Diagnosis not present

## 2014-01-28 DIAGNOSIS — F0390 Unspecified dementia without behavioral disturbance: Secondary | ICD-10-CM | POA: Diagnosis not present

## 2014-01-28 DIAGNOSIS — E119 Type 2 diabetes mellitus without complications: Secondary | ICD-10-CM | POA: Diagnosis not present

## 2014-01-28 DIAGNOSIS — F329 Major depressive disorder, single episode, unspecified: Secondary | ICD-10-CM | POA: Diagnosis not present

## 2014-01-28 DIAGNOSIS — M150 Primary generalized (osteo)arthritis: Secondary | ICD-10-CM | POA: Diagnosis not present

## 2014-01-28 DIAGNOSIS — I10 Essential (primary) hypertension: Secondary | ICD-10-CM | POA: Diagnosis not present

## 2014-01-29 DIAGNOSIS — M6281 Muscle weakness (generalized): Secondary | ICD-10-CM | POA: Diagnosis not present

## 2014-01-29 DIAGNOSIS — F0390 Unspecified dementia without behavioral disturbance: Secondary | ICD-10-CM | POA: Diagnosis not present

## 2014-01-29 DIAGNOSIS — F329 Major depressive disorder, single episode, unspecified: Secondary | ICD-10-CM | POA: Diagnosis not present

## 2014-01-29 DIAGNOSIS — I10 Essential (primary) hypertension: Secondary | ICD-10-CM | POA: Diagnosis not present

## 2014-01-29 DIAGNOSIS — M150 Primary generalized (osteo)arthritis: Secondary | ICD-10-CM | POA: Diagnosis not present

## 2014-01-29 DIAGNOSIS — E119 Type 2 diabetes mellitus without complications: Secondary | ICD-10-CM | POA: Diagnosis not present

## 2014-01-30 DIAGNOSIS — I10 Essential (primary) hypertension: Secondary | ICD-10-CM | POA: Diagnosis not present

## 2014-01-30 DIAGNOSIS — E119 Type 2 diabetes mellitus without complications: Secondary | ICD-10-CM | POA: Diagnosis not present

## 2014-01-30 DIAGNOSIS — M150 Primary generalized (osteo)arthritis: Secondary | ICD-10-CM | POA: Diagnosis not present

## 2014-01-30 DIAGNOSIS — F329 Major depressive disorder, single episode, unspecified: Secondary | ICD-10-CM | POA: Diagnosis not present

## 2014-01-30 DIAGNOSIS — F0390 Unspecified dementia without behavioral disturbance: Secondary | ICD-10-CM | POA: Diagnosis not present

## 2014-01-30 DIAGNOSIS — M6281 Muscle weakness (generalized): Secondary | ICD-10-CM | POA: Diagnosis not present

## 2014-01-31 DIAGNOSIS — E119 Type 2 diabetes mellitus without complications: Secondary | ICD-10-CM | POA: Diagnosis not present

## 2014-01-31 DIAGNOSIS — Z9181 History of falling: Secondary | ICD-10-CM | POA: Diagnosis not present

## 2014-01-31 DIAGNOSIS — F0390 Unspecified dementia without behavioral disturbance: Secondary | ICD-10-CM | POA: Diagnosis not present

## 2014-01-31 DIAGNOSIS — M150 Primary generalized (osteo)arthritis: Secondary | ICD-10-CM | POA: Diagnosis not present

## 2014-01-31 DIAGNOSIS — F329 Major depressive disorder, single episode, unspecified: Secondary | ICD-10-CM | POA: Diagnosis not present

## 2014-01-31 DIAGNOSIS — M6281 Muscle weakness (generalized): Secondary | ICD-10-CM | POA: Diagnosis not present

## 2014-01-31 DIAGNOSIS — I10 Essential (primary) hypertension: Secondary | ICD-10-CM | POA: Diagnosis not present

## 2014-02-01 DIAGNOSIS — F0390 Unspecified dementia without behavioral disturbance: Secondary | ICD-10-CM | POA: Diagnosis not present

## 2014-02-01 DIAGNOSIS — I10 Essential (primary) hypertension: Secondary | ICD-10-CM | POA: Diagnosis not present

## 2014-02-01 DIAGNOSIS — M6281 Muscle weakness (generalized): Secondary | ICD-10-CM | POA: Diagnosis not present

## 2014-02-01 DIAGNOSIS — F329 Major depressive disorder, single episode, unspecified: Secondary | ICD-10-CM | POA: Diagnosis not present

## 2014-02-01 DIAGNOSIS — M150 Primary generalized (osteo)arthritis: Secondary | ICD-10-CM | POA: Diagnosis not present

## 2014-02-01 DIAGNOSIS — E119 Type 2 diabetes mellitus without complications: Secondary | ICD-10-CM | POA: Diagnosis not present

## 2014-02-05 DIAGNOSIS — F329 Major depressive disorder, single episode, unspecified: Secondary | ICD-10-CM | POA: Diagnosis not present

## 2014-02-05 DIAGNOSIS — M150 Primary generalized (osteo)arthritis: Secondary | ICD-10-CM | POA: Diagnosis not present

## 2014-02-05 DIAGNOSIS — E119 Type 2 diabetes mellitus without complications: Secondary | ICD-10-CM | POA: Diagnosis not present

## 2014-02-05 DIAGNOSIS — I10 Essential (primary) hypertension: Secondary | ICD-10-CM | POA: Diagnosis not present

## 2014-02-05 DIAGNOSIS — F0390 Unspecified dementia without behavioral disturbance: Secondary | ICD-10-CM | POA: Diagnosis not present

## 2014-02-05 DIAGNOSIS — M6281 Muscle weakness (generalized): Secondary | ICD-10-CM | POA: Diagnosis not present

## 2014-02-06 DIAGNOSIS — I10 Essential (primary) hypertension: Secondary | ICD-10-CM | POA: Diagnosis not present

## 2014-02-06 DIAGNOSIS — M150 Primary generalized (osteo)arthritis: Secondary | ICD-10-CM | POA: Diagnosis not present

## 2014-02-06 DIAGNOSIS — M6281 Muscle weakness (generalized): Secondary | ICD-10-CM | POA: Diagnosis not present

## 2014-02-06 DIAGNOSIS — F329 Major depressive disorder, single episode, unspecified: Secondary | ICD-10-CM | POA: Diagnosis not present

## 2014-02-06 DIAGNOSIS — E119 Type 2 diabetes mellitus without complications: Secondary | ICD-10-CM | POA: Diagnosis not present

## 2014-02-06 DIAGNOSIS — F0390 Unspecified dementia without behavioral disturbance: Secondary | ICD-10-CM | POA: Diagnosis not present

## 2014-02-07 DIAGNOSIS — F329 Major depressive disorder, single episode, unspecified: Secondary | ICD-10-CM | POA: Diagnosis not present

## 2014-02-07 DIAGNOSIS — I10 Essential (primary) hypertension: Secondary | ICD-10-CM | POA: Diagnosis not present

## 2014-02-07 DIAGNOSIS — E119 Type 2 diabetes mellitus without complications: Secondary | ICD-10-CM | POA: Diagnosis not present

## 2014-02-07 DIAGNOSIS — F0390 Unspecified dementia without behavioral disturbance: Secondary | ICD-10-CM | POA: Diagnosis not present

## 2014-02-07 DIAGNOSIS — M150 Primary generalized (osteo)arthritis: Secondary | ICD-10-CM | POA: Diagnosis not present

## 2014-02-07 DIAGNOSIS — M6281 Muscle weakness (generalized): Secondary | ICD-10-CM | POA: Diagnosis not present

## 2014-02-08 DIAGNOSIS — M6281 Muscle weakness (generalized): Secondary | ICD-10-CM | POA: Diagnosis not present

## 2014-02-08 DIAGNOSIS — E119 Type 2 diabetes mellitus without complications: Secondary | ICD-10-CM | POA: Diagnosis not present

## 2014-02-08 DIAGNOSIS — I10 Essential (primary) hypertension: Secondary | ICD-10-CM | POA: Diagnosis not present

## 2014-02-08 DIAGNOSIS — F0390 Unspecified dementia without behavioral disturbance: Secondary | ICD-10-CM | POA: Diagnosis not present

## 2014-02-08 DIAGNOSIS — M150 Primary generalized (osteo)arthritis: Secondary | ICD-10-CM | POA: Diagnosis not present

## 2014-02-08 DIAGNOSIS — F329 Major depressive disorder, single episode, unspecified: Secondary | ICD-10-CM | POA: Diagnosis not present

## 2014-02-13 DIAGNOSIS — M150 Primary generalized (osteo)arthritis: Secondary | ICD-10-CM | POA: Diagnosis not present

## 2014-02-13 DIAGNOSIS — I10 Essential (primary) hypertension: Secondary | ICD-10-CM | POA: Diagnosis not present

## 2014-02-13 DIAGNOSIS — E119 Type 2 diabetes mellitus without complications: Secondary | ICD-10-CM | POA: Diagnosis not present

## 2014-02-13 DIAGNOSIS — F329 Major depressive disorder, single episode, unspecified: Secondary | ICD-10-CM | POA: Diagnosis not present

## 2014-02-13 DIAGNOSIS — F0390 Unspecified dementia without behavioral disturbance: Secondary | ICD-10-CM | POA: Diagnosis not present

## 2014-02-13 DIAGNOSIS — M6281 Muscle weakness (generalized): Secondary | ICD-10-CM | POA: Diagnosis not present

## 2014-02-14 DIAGNOSIS — I10 Essential (primary) hypertension: Secondary | ICD-10-CM | POA: Diagnosis not present

## 2014-02-14 DIAGNOSIS — M6281 Muscle weakness (generalized): Secondary | ICD-10-CM | POA: Diagnosis not present

## 2014-02-14 DIAGNOSIS — E119 Type 2 diabetes mellitus without complications: Secondary | ICD-10-CM | POA: Diagnosis not present

## 2014-02-14 DIAGNOSIS — F0390 Unspecified dementia without behavioral disturbance: Secondary | ICD-10-CM | POA: Diagnosis not present

## 2014-02-14 DIAGNOSIS — F329 Major depressive disorder, single episode, unspecified: Secondary | ICD-10-CM | POA: Diagnosis not present

## 2014-02-14 DIAGNOSIS — M150 Primary generalized (osteo)arthritis: Secondary | ICD-10-CM | POA: Diagnosis not present

## 2014-02-18 DIAGNOSIS — I6529 Occlusion and stenosis of unspecified carotid artery: Secondary | ICD-10-CM | POA: Diagnosis not present

## 2014-02-20 DIAGNOSIS — F329 Major depressive disorder, single episode, unspecified: Secondary | ICD-10-CM | POA: Diagnosis not present

## 2014-02-20 DIAGNOSIS — E119 Type 2 diabetes mellitus without complications: Secondary | ICD-10-CM | POA: Diagnosis not present

## 2014-02-20 DIAGNOSIS — M6281 Muscle weakness (generalized): Secondary | ICD-10-CM | POA: Diagnosis not present

## 2014-02-20 DIAGNOSIS — F0390 Unspecified dementia without behavioral disturbance: Secondary | ICD-10-CM | POA: Diagnosis not present

## 2014-02-20 DIAGNOSIS — I10 Essential (primary) hypertension: Secondary | ICD-10-CM | POA: Diagnosis not present

## 2014-02-20 DIAGNOSIS — M150 Primary generalized (osteo)arthritis: Secondary | ICD-10-CM | POA: Diagnosis not present

## 2014-02-21 DIAGNOSIS — M6281 Muscle weakness (generalized): Secondary | ICD-10-CM | POA: Diagnosis not present

## 2014-02-21 DIAGNOSIS — M150 Primary generalized (osteo)arthritis: Secondary | ICD-10-CM | POA: Diagnosis not present

## 2014-02-21 DIAGNOSIS — I10 Essential (primary) hypertension: Secondary | ICD-10-CM | POA: Diagnosis not present

## 2014-02-21 DIAGNOSIS — F0390 Unspecified dementia without behavioral disturbance: Secondary | ICD-10-CM | POA: Diagnosis not present

## 2014-02-21 DIAGNOSIS — F329 Major depressive disorder, single episode, unspecified: Secondary | ICD-10-CM | POA: Diagnosis not present

## 2014-02-21 DIAGNOSIS — E119 Type 2 diabetes mellitus without complications: Secondary | ICD-10-CM | POA: Diagnosis not present

## 2014-02-27 DIAGNOSIS — E119 Type 2 diabetes mellitus without complications: Secondary | ICD-10-CM | POA: Diagnosis not present

## 2014-02-27 DIAGNOSIS — M150 Primary generalized (osteo)arthritis: Secondary | ICD-10-CM | POA: Diagnosis not present

## 2014-02-27 DIAGNOSIS — F329 Major depressive disorder, single episode, unspecified: Secondary | ICD-10-CM | POA: Diagnosis not present

## 2014-02-27 DIAGNOSIS — I10 Essential (primary) hypertension: Secondary | ICD-10-CM | POA: Diagnosis not present

## 2014-02-27 DIAGNOSIS — F0390 Unspecified dementia without behavioral disturbance: Secondary | ICD-10-CM | POA: Diagnosis not present

## 2014-02-27 DIAGNOSIS — M6281 Muscle weakness (generalized): Secondary | ICD-10-CM | POA: Diagnosis not present

## 2014-02-28 DIAGNOSIS — F329 Major depressive disorder, single episode, unspecified: Secondary | ICD-10-CM | POA: Diagnosis not present

## 2014-02-28 DIAGNOSIS — M6281 Muscle weakness (generalized): Secondary | ICD-10-CM | POA: Diagnosis not present

## 2014-02-28 DIAGNOSIS — I10 Essential (primary) hypertension: Secondary | ICD-10-CM | POA: Diagnosis not present

## 2014-02-28 DIAGNOSIS — M150 Primary generalized (osteo)arthritis: Secondary | ICD-10-CM | POA: Diagnosis not present

## 2014-02-28 DIAGNOSIS — E119 Type 2 diabetes mellitus without complications: Secondary | ICD-10-CM | POA: Diagnosis not present

## 2014-02-28 DIAGNOSIS — F0390 Unspecified dementia without behavioral disturbance: Secondary | ICD-10-CM | POA: Diagnosis not present

## 2014-03-05 DIAGNOSIS — E119 Type 2 diabetes mellitus without complications: Secondary | ICD-10-CM | POA: Diagnosis not present

## 2014-03-05 DIAGNOSIS — M150 Primary generalized (osteo)arthritis: Secondary | ICD-10-CM | POA: Diagnosis not present

## 2014-03-05 DIAGNOSIS — F0390 Unspecified dementia without behavioral disturbance: Secondary | ICD-10-CM | POA: Diagnosis not present

## 2014-03-05 DIAGNOSIS — I10 Essential (primary) hypertension: Secondary | ICD-10-CM | POA: Diagnosis not present

## 2014-03-05 DIAGNOSIS — M6281 Muscle weakness (generalized): Secondary | ICD-10-CM | POA: Diagnosis not present

## 2014-03-05 DIAGNOSIS — F329 Major depressive disorder, single episode, unspecified: Secondary | ICD-10-CM | POA: Diagnosis not present

## 2014-03-08 DIAGNOSIS — E119 Type 2 diabetes mellitus without complications: Secondary | ICD-10-CM | POA: Diagnosis not present

## 2014-03-08 DIAGNOSIS — F329 Major depressive disorder, single episode, unspecified: Secondary | ICD-10-CM | POA: Diagnosis not present

## 2014-03-08 DIAGNOSIS — M150 Primary generalized (osteo)arthritis: Secondary | ICD-10-CM | POA: Diagnosis not present

## 2014-03-08 DIAGNOSIS — F0390 Unspecified dementia without behavioral disturbance: Secondary | ICD-10-CM | POA: Diagnosis not present

## 2014-03-08 DIAGNOSIS — M6281 Muscle weakness (generalized): Secondary | ICD-10-CM | POA: Diagnosis not present

## 2014-03-08 DIAGNOSIS — I10 Essential (primary) hypertension: Secondary | ICD-10-CM | POA: Diagnosis not present

## 2014-03-19 DIAGNOSIS — I10 Essential (primary) hypertension: Secondary | ICD-10-CM | POA: Diagnosis not present

## 2014-03-19 DIAGNOSIS — M150 Primary generalized (osteo)arthritis: Secondary | ICD-10-CM | POA: Diagnosis not present

## 2014-03-19 DIAGNOSIS — E119 Type 2 diabetes mellitus without complications: Secondary | ICD-10-CM | POA: Diagnosis not present

## 2014-03-19 DIAGNOSIS — F329 Major depressive disorder, single episode, unspecified: Secondary | ICD-10-CM | POA: Diagnosis not present

## 2014-03-19 DIAGNOSIS — F0390 Unspecified dementia without behavioral disturbance: Secondary | ICD-10-CM | POA: Diagnosis not present

## 2014-03-19 DIAGNOSIS — M6281 Muscle weakness (generalized): Secondary | ICD-10-CM | POA: Diagnosis not present

## 2014-03-21 DIAGNOSIS — F0390 Unspecified dementia without behavioral disturbance: Secondary | ICD-10-CM | POA: Diagnosis not present

## 2014-03-21 DIAGNOSIS — M150 Primary generalized (osteo)arthritis: Secondary | ICD-10-CM | POA: Diagnosis not present

## 2014-03-21 DIAGNOSIS — E119 Type 2 diabetes mellitus without complications: Secondary | ICD-10-CM | POA: Diagnosis not present

## 2014-03-21 DIAGNOSIS — M6281 Muscle weakness (generalized): Secondary | ICD-10-CM | POA: Diagnosis not present

## 2014-03-21 DIAGNOSIS — F329 Major depressive disorder, single episode, unspecified: Secondary | ICD-10-CM | POA: Diagnosis not present

## 2014-03-21 DIAGNOSIS — I10 Essential (primary) hypertension: Secondary | ICD-10-CM | POA: Diagnosis not present

## 2014-03-24 DIAGNOSIS — M6281 Muscle weakness (generalized): Secondary | ICD-10-CM | POA: Diagnosis not present

## 2014-03-24 DIAGNOSIS — E119 Type 2 diabetes mellitus without complications: Secondary | ICD-10-CM | POA: Diagnosis not present

## 2014-03-24 DIAGNOSIS — Z7902 Long term (current) use of antithrombotics/antiplatelets: Secondary | ICD-10-CM | POA: Diagnosis not present

## 2014-03-24 DIAGNOSIS — Z7982 Long term (current) use of aspirin: Secondary | ICD-10-CM | POA: Diagnosis not present

## 2014-03-24 DIAGNOSIS — F329 Major depressive disorder, single episode, unspecified: Secondary | ICD-10-CM | POA: Diagnosis not present

## 2014-03-24 DIAGNOSIS — M150 Primary generalized (osteo)arthritis: Secondary | ICD-10-CM | POA: Diagnosis not present

## 2014-03-24 DIAGNOSIS — F0390 Unspecified dementia without behavioral disturbance: Secondary | ICD-10-CM | POA: Diagnosis not present

## 2014-03-24 DIAGNOSIS — I10 Essential (primary) hypertension: Secondary | ICD-10-CM | POA: Diagnosis not present

## 2014-03-24 DIAGNOSIS — Z9181 History of falling: Secondary | ICD-10-CM | POA: Diagnosis not present

## 2014-03-28 DIAGNOSIS — F0390 Unspecified dementia without behavioral disturbance: Secondary | ICD-10-CM | POA: Diagnosis not present

## 2014-03-28 DIAGNOSIS — F329 Major depressive disorder, single episode, unspecified: Secondary | ICD-10-CM | POA: Diagnosis not present

## 2014-04-02 DIAGNOSIS — M6281 Muscle weakness (generalized): Secondary | ICD-10-CM | POA: Diagnosis not present

## 2014-04-02 DIAGNOSIS — I10 Essential (primary) hypertension: Secondary | ICD-10-CM | POA: Diagnosis not present

## 2014-04-02 DIAGNOSIS — F329 Major depressive disorder, single episode, unspecified: Secondary | ICD-10-CM | POA: Diagnosis not present

## 2014-04-02 DIAGNOSIS — M150 Primary generalized (osteo)arthritis: Secondary | ICD-10-CM | POA: Diagnosis not present

## 2014-04-02 DIAGNOSIS — E119 Type 2 diabetes mellitus without complications: Secondary | ICD-10-CM | POA: Diagnosis not present

## 2014-04-02 DIAGNOSIS — F0390 Unspecified dementia without behavioral disturbance: Secondary | ICD-10-CM | POA: Diagnosis not present

## 2014-04-02 DIAGNOSIS — Z7902 Long term (current) use of antithrombotics/antiplatelets: Secondary | ICD-10-CM | POA: Diagnosis not present

## 2014-04-02 DIAGNOSIS — Z9181 History of falling: Secondary | ICD-10-CM | POA: Diagnosis not present

## 2014-04-18 DIAGNOSIS — I10 Essential (primary) hypertension: Secondary | ICD-10-CM | POA: Diagnosis not present

## 2014-04-18 DIAGNOSIS — J019 Acute sinusitis, unspecified: Secondary | ICD-10-CM | POA: Diagnosis not present

## 2014-04-18 DIAGNOSIS — R531 Weakness: Secondary | ICD-10-CM | POA: Diagnosis not present

## 2014-04-18 DIAGNOSIS — E119 Type 2 diabetes mellitus without complications: Secondary | ICD-10-CM | POA: Diagnosis not present

## 2014-04-18 DIAGNOSIS — J309 Allergic rhinitis, unspecified: Secondary | ICD-10-CM | POA: Diagnosis not present

## 2014-04-18 DIAGNOSIS — E039 Hypothyroidism, unspecified: Secondary | ICD-10-CM | POA: Diagnosis not present

## 2014-05-30 DIAGNOSIS — E0590 Thyrotoxicosis, unspecified without thyrotoxic crisis or storm: Secondary | ICD-10-CM | POA: Diagnosis not present

## 2014-05-30 DIAGNOSIS — R5383 Other fatigue: Secondary | ICD-10-CM | POA: Diagnosis not present

## 2014-05-30 DIAGNOSIS — F329 Major depressive disorder, single episode, unspecified: Secondary | ICD-10-CM | POA: Diagnosis not present

## 2014-05-30 DIAGNOSIS — F0390 Unspecified dementia without behavioral disturbance: Secondary | ICD-10-CM | POA: Diagnosis not present

## 2014-06-14 ENCOUNTER — Emergency Department: Payer: Self-pay | Admitting: Emergency Medicine

## 2014-06-14 DIAGNOSIS — Z79899 Other long term (current) drug therapy: Secondary | ICD-10-CM | POA: Diagnosis not present

## 2014-06-14 DIAGNOSIS — R4182 Altered mental status, unspecified: Secondary | ICD-10-CM | POA: Diagnosis not present

## 2014-06-14 DIAGNOSIS — Z792 Long term (current) use of antibiotics: Secondary | ICD-10-CM | POA: Diagnosis not present

## 2014-06-14 DIAGNOSIS — F0390 Unspecified dementia without behavioral disturbance: Secondary | ICD-10-CM | POA: Diagnosis not present

## 2014-06-14 DIAGNOSIS — I6782 Cerebral ischemia: Secondary | ICD-10-CM | POA: Diagnosis not present

## 2014-06-14 DIAGNOSIS — I517 Cardiomegaly: Secondary | ICD-10-CM | POA: Diagnosis not present

## 2014-06-14 DIAGNOSIS — E119 Type 2 diabetes mellitus without complications: Secondary | ICD-10-CM | POA: Diagnosis not present

## 2014-06-14 DIAGNOSIS — R001 Bradycardia, unspecified: Secondary | ICD-10-CM | POA: Diagnosis not present

## 2014-06-14 DIAGNOSIS — Z7902 Long term (current) use of antithrombotics/antiplatelets: Secondary | ICD-10-CM | POA: Diagnosis not present

## 2014-06-14 DIAGNOSIS — I10 Essential (primary) hypertension: Secondary | ICD-10-CM | POA: Diagnosis not present

## 2014-06-15 DIAGNOSIS — I6782 Cerebral ischemia: Secondary | ICD-10-CM | POA: Diagnosis not present

## 2014-06-18 DIAGNOSIS — M6281 Muscle weakness (generalized): Secondary | ICD-10-CM | POA: Diagnosis not present

## 2014-06-18 DIAGNOSIS — I10 Essential (primary) hypertension: Secondary | ICD-10-CM | POA: Diagnosis not present

## 2014-06-18 DIAGNOSIS — E0590 Thyrotoxicosis, unspecified without thyrotoxic crisis or storm: Secondary | ICD-10-CM | POA: Diagnosis not present

## 2014-06-18 DIAGNOSIS — E11649 Type 2 diabetes mellitus with hypoglycemia without coma: Secondary | ICD-10-CM | POA: Diagnosis not present

## 2014-06-18 DIAGNOSIS — F329 Major depressive disorder, single episode, unspecified: Secondary | ICD-10-CM | POA: Diagnosis not present

## 2014-06-18 DIAGNOSIS — F0280 Dementia in other diseases classified elsewhere without behavioral disturbance: Secondary | ICD-10-CM | POA: Diagnosis not present

## 2014-06-18 DIAGNOSIS — Z7902 Long term (current) use of antithrombotics/antiplatelets: Secondary | ICD-10-CM | POA: Diagnosis not present

## 2014-06-18 DIAGNOSIS — J309 Allergic rhinitis, unspecified: Secondary | ICD-10-CM | POA: Diagnosis not present

## 2014-06-19 DIAGNOSIS — E119 Type 2 diabetes mellitus without complications: Secondary | ICD-10-CM | POA: Diagnosis not present

## 2014-06-19 DIAGNOSIS — E785 Hyperlipidemia, unspecified: Secondary | ICD-10-CM | POA: Diagnosis not present

## 2014-06-19 DIAGNOSIS — E0590 Thyrotoxicosis, unspecified without thyrotoxic crisis or storm: Secondary | ICD-10-CM | POA: Diagnosis not present

## 2014-06-19 DIAGNOSIS — R2689 Other abnormalities of gait and mobility: Secondary | ICD-10-CM | POA: Diagnosis not present

## 2014-06-19 DIAGNOSIS — M6250 Muscle wasting and atrophy, not elsewhere classified, unspecified site: Secondary | ICD-10-CM | POA: Diagnosis not present

## 2014-06-19 DIAGNOSIS — F0390 Unspecified dementia without behavioral disturbance: Secondary | ICD-10-CM | POA: Diagnosis not present

## 2014-06-19 DIAGNOSIS — K219 Gastro-esophageal reflux disease without esophagitis: Secondary | ICD-10-CM | POA: Diagnosis not present

## 2014-06-19 DIAGNOSIS — I10 Essential (primary) hypertension: Secondary | ICD-10-CM | POA: Diagnosis not present

## 2014-06-19 DIAGNOSIS — Z9181 History of falling: Secondary | ICD-10-CM | POA: Diagnosis not present

## 2014-06-19 DIAGNOSIS — D649 Anemia, unspecified: Secondary | ICD-10-CM | POA: Diagnosis not present

## 2014-06-25 DIAGNOSIS — I10 Essential (primary) hypertension: Secondary | ICD-10-CM | POA: Diagnosis not present

## 2014-06-25 DIAGNOSIS — E119 Type 2 diabetes mellitus without complications: Secondary | ICD-10-CM | POA: Diagnosis not present

## 2014-06-25 DIAGNOSIS — D649 Anemia, unspecified: Secondary | ICD-10-CM | POA: Diagnosis not present

## 2014-06-25 DIAGNOSIS — M6250 Muscle wasting and atrophy, not elsewhere classified, unspecified site: Secondary | ICD-10-CM | POA: Diagnosis not present

## 2014-06-25 DIAGNOSIS — F0390 Unspecified dementia without behavioral disturbance: Secondary | ICD-10-CM | POA: Diagnosis not present

## 2014-06-25 DIAGNOSIS — R2689 Other abnormalities of gait and mobility: Secondary | ICD-10-CM | POA: Diagnosis not present

## 2014-06-28 DIAGNOSIS — E119 Type 2 diabetes mellitus without complications: Secondary | ICD-10-CM | POA: Diagnosis not present

## 2014-06-28 DIAGNOSIS — N390 Urinary tract infection, site not specified: Secondary | ICD-10-CM | POA: Diagnosis not present

## 2014-06-28 DIAGNOSIS — F0390 Unspecified dementia without behavioral disturbance: Secondary | ICD-10-CM | POA: Diagnosis not present

## 2014-06-28 DIAGNOSIS — D649 Anemia, unspecified: Secondary | ICD-10-CM | POA: Diagnosis not present

## 2014-06-28 DIAGNOSIS — R2689 Other abnormalities of gait and mobility: Secondary | ICD-10-CM | POA: Diagnosis not present

## 2014-06-28 DIAGNOSIS — I10 Essential (primary) hypertension: Secondary | ICD-10-CM | POA: Diagnosis not present

## 2014-06-28 DIAGNOSIS — M6250 Muscle wasting and atrophy, not elsewhere classified, unspecified site: Secondary | ICD-10-CM | POA: Diagnosis not present

## 2014-07-01 DIAGNOSIS — R2689 Other abnormalities of gait and mobility: Secondary | ICD-10-CM | POA: Diagnosis not present

## 2014-07-01 DIAGNOSIS — I10 Essential (primary) hypertension: Secondary | ICD-10-CM | POA: Diagnosis not present

## 2014-07-01 DIAGNOSIS — F0390 Unspecified dementia without behavioral disturbance: Secondary | ICD-10-CM | POA: Diagnosis not present

## 2014-07-01 DIAGNOSIS — M6250 Muscle wasting and atrophy, not elsewhere classified, unspecified site: Secondary | ICD-10-CM | POA: Diagnosis not present

## 2014-07-01 DIAGNOSIS — E119 Type 2 diabetes mellitus without complications: Secondary | ICD-10-CM | POA: Diagnosis not present

## 2014-07-01 DIAGNOSIS — D649 Anemia, unspecified: Secondary | ICD-10-CM | POA: Diagnosis not present

## 2014-07-04 DIAGNOSIS — R2689 Other abnormalities of gait and mobility: Secondary | ICD-10-CM | POA: Diagnosis not present

## 2014-07-04 DIAGNOSIS — I10 Essential (primary) hypertension: Secondary | ICD-10-CM | POA: Diagnosis not present

## 2014-07-04 DIAGNOSIS — F0390 Unspecified dementia without behavioral disturbance: Secondary | ICD-10-CM | POA: Diagnosis not present

## 2014-07-04 DIAGNOSIS — E119 Type 2 diabetes mellitus without complications: Secondary | ICD-10-CM | POA: Diagnosis not present

## 2014-07-04 DIAGNOSIS — M6250 Muscle wasting and atrophy, not elsewhere classified, unspecified site: Secondary | ICD-10-CM | POA: Diagnosis not present

## 2014-07-04 DIAGNOSIS — D649 Anemia, unspecified: Secondary | ICD-10-CM | POA: Diagnosis not present

## 2014-07-08 DIAGNOSIS — E119 Type 2 diabetes mellitus without complications: Secondary | ICD-10-CM | POA: Diagnosis not present

## 2014-07-08 DIAGNOSIS — D649 Anemia, unspecified: Secondary | ICD-10-CM | POA: Diagnosis not present

## 2014-07-08 DIAGNOSIS — F0390 Unspecified dementia without behavioral disturbance: Secondary | ICD-10-CM | POA: Diagnosis not present

## 2014-07-08 DIAGNOSIS — M6250 Muscle wasting and atrophy, not elsewhere classified, unspecified site: Secondary | ICD-10-CM | POA: Diagnosis not present

## 2014-07-08 DIAGNOSIS — R2689 Other abnormalities of gait and mobility: Secondary | ICD-10-CM | POA: Diagnosis not present

## 2014-07-08 DIAGNOSIS — I10 Essential (primary) hypertension: Secondary | ICD-10-CM | POA: Diagnosis not present

## 2014-07-10 DIAGNOSIS — M6250 Muscle wasting and atrophy, not elsewhere classified, unspecified site: Secondary | ICD-10-CM | POA: Diagnosis not present

## 2014-07-10 DIAGNOSIS — I10 Essential (primary) hypertension: Secondary | ICD-10-CM | POA: Diagnosis not present

## 2014-07-10 DIAGNOSIS — D649 Anemia, unspecified: Secondary | ICD-10-CM | POA: Diagnosis not present

## 2014-07-10 DIAGNOSIS — F331 Major depressive disorder, recurrent, moderate: Secondary | ICD-10-CM | POA: Diagnosis not present

## 2014-07-10 DIAGNOSIS — F0280 Dementia in other diseases classified elsewhere without behavioral disturbance: Secondary | ICD-10-CM | POA: Diagnosis not present

## 2014-07-10 DIAGNOSIS — J309 Allergic rhinitis, unspecified: Secondary | ICD-10-CM | POA: Diagnosis not present

## 2014-07-10 DIAGNOSIS — E119 Type 2 diabetes mellitus without complications: Secondary | ICD-10-CM | POA: Diagnosis not present

## 2014-07-10 DIAGNOSIS — F0390 Unspecified dementia without behavioral disturbance: Secondary | ICD-10-CM | POA: Diagnosis not present

## 2014-07-10 DIAGNOSIS — E11649 Type 2 diabetes mellitus with hypoglycemia without coma: Secondary | ICD-10-CM | POA: Diagnosis not present

## 2014-07-10 DIAGNOSIS — R2689 Other abnormalities of gait and mobility: Secondary | ICD-10-CM | POA: Diagnosis not present

## 2014-07-10 DIAGNOSIS — J019 Acute sinusitis, unspecified: Secondary | ICD-10-CM | POA: Diagnosis not present

## 2014-07-17 DIAGNOSIS — D649 Anemia, unspecified: Secondary | ICD-10-CM | POA: Diagnosis not present

## 2014-07-17 DIAGNOSIS — F0390 Unspecified dementia without behavioral disturbance: Secondary | ICD-10-CM | POA: Diagnosis not present

## 2014-07-17 DIAGNOSIS — E119 Type 2 diabetes mellitus without complications: Secondary | ICD-10-CM | POA: Diagnosis not present

## 2014-07-17 DIAGNOSIS — I10 Essential (primary) hypertension: Secondary | ICD-10-CM | POA: Diagnosis not present

## 2014-07-17 DIAGNOSIS — R2689 Other abnormalities of gait and mobility: Secondary | ICD-10-CM | POA: Diagnosis not present

## 2014-07-17 DIAGNOSIS — M6250 Muscle wasting and atrophy, not elsewhere classified, unspecified site: Secondary | ICD-10-CM | POA: Diagnosis not present

## 2014-07-24 DIAGNOSIS — R2689 Other abnormalities of gait and mobility: Secondary | ICD-10-CM | POA: Diagnosis not present

## 2014-07-24 DIAGNOSIS — E119 Type 2 diabetes mellitus without complications: Secondary | ICD-10-CM | POA: Diagnosis not present

## 2014-07-24 DIAGNOSIS — F0390 Unspecified dementia without behavioral disturbance: Secondary | ICD-10-CM | POA: Diagnosis not present

## 2014-07-24 DIAGNOSIS — I10 Essential (primary) hypertension: Secondary | ICD-10-CM | POA: Diagnosis not present

## 2014-07-24 DIAGNOSIS — M6250 Muscle wasting and atrophy, not elsewhere classified, unspecified site: Secondary | ICD-10-CM | POA: Diagnosis not present

## 2014-07-24 DIAGNOSIS — D649 Anemia, unspecified: Secondary | ICD-10-CM | POA: Diagnosis not present

## 2014-08-02 NOTE — Op Note (Signed)
PATIENT NAME:  Vanessa Montes, Vanessa Montes MR#:  [id number] DATE OF BIRTH:  [birth year]  DATE OF PROCEDURE:  11/06/2012  PREOPERATIVE DIAGNOSIS: Degenerative arthrosis of the right knee.   POSTOPERATIVE DIAGNOSIS: Degenerative arthrosis of the right knee.   PROCEDURE PERFORMED: Right total knee arthroplasty using computer-assisted navigation.   SURGEON: Laurice Record. Marry Guan, M.D.   ASSISTANT:  April Berndt, NP (required to maintain retraction throughout the procedure.)   ANESTHESIA: Spinal.   ESTIMATED BLOOD LOSS: 50 mL.   FLUIDS REPLACED: 1000 mL of crystalloid.   TOURNIQUET TIME: 95 minutes.   DRAINS: Two medium drains to reinfusion system.   SOFT TISSUE RELEASES: Anterior cruciate ligament, posterior cruciate ligament, deep and superficial medial collateral ligament, and patellofemoral ligament.   IMPLANTS UTILIZED:  DePuy PFC Sigma size 2.5 posterior stabilized femoral component (cemented), size 2 MBT tibial component (cemented), 32 mm 3-peg oval dome patella), and a 10 mm stabilized rotating platform polyethylene insert.   INDICATIONS FOR SURGERY: The patient is an 81 year old female who has been seen for complaints of severe progressive right knee pain. X-rays demonstrated severe degenerative changes in tricompartmental fashion with relative varus deformity. After discussion of the risks and benefits of surgical intervention, the patient expressed understanding of the risks and benefits and agreed with plans for surgical intervention.   PROCEDURE IN DETAIL: The patient was brought to the operating room and, after adequate spinal anesthesia was achieved, a tourniquet was placed on the patient's upper right thigh. The patient's right knee and leg were cleaned and prepped with alcohol and DuraPrep and draped in the usual sterile fashion. A "timeout" was performed as per usual protocol. The right lower extremity was exsanguinated using an Esmarch, and the tourniquet was inflated to 300 mmHg. An anterior  longitudinal incision was made followed by a standard mid vastus approach. A moderate effusion was evacuated. The deep fibers of the medial collateral ligament were elevated in a subperiosteal fashion off of the medial flare of the tibia so as to maintain a continuous soft tissue sleeve. The patella was subluxed laterally and the patellofemoral ligament was incised. Inspection of the knee demonstrated severe degenerative changes in a tricompartmental fashion with full-thickness loss of articular cartilage medially. Prominent osteophytes were debrided using rongeur. Anterior and posterior cruciate ligaments were excised. Two 4.0 mm Schanz pins were inserted into the femur and into the tibia for attachment of the array of trackers used for computer-assisted navigation. Hip center was identified using a circumduction technique. Distal landmarks were mapped using the computer. The distal femur and proximal tibia were mapped using the computer. Distal femoral cutting guide was positioned using computer-assisted navigation so as to achieve 5 degree distal valgus cut. Cut was performed and verified using the computer. Distal femur was sized and it was felt that a size 2.5 femoral component was appropriate. A size 2.5 cutting guide was positioned and anterior cut was performed and verified using the computer. This was followed by completion of the posterior and chamfer cuts. Femoral cutting guide for the central box was then positioned and the central box cut was performed. Attention was then directed to the proximal tibia. Medial and lateral menisci were excised. The extramedullary tibial cutting was positioned using computer-assisted navigation so as to achieve 0 degree varus valgus alignment and 0 degree posterior slope. Cut was performed and verified using the computer. Several subchondral cysts were evacuated along the surface. The proximal tibia was sized and it was felt that a size 2 tibial tray was appropriate.  Tibial and femoral trials were inserted followed by insertion of a 10 mm polyethylene insert. The knee was felt to be tight medially. A Cobb elevator was used to elevate the superficial fibers of the medial collateral ligament. This allowed for excellent mediolateral soft tissue balancing both in full extension and in flexion. Finally, the patella was cut and prepared so as to accommodate a 32 mm 3-pegged oval dome patella. Patellar trial was placed and the knee was placed through a range of motion with excellent patellar tracking appreciated.   Femoral trial was removed. Central post hole for the tibial component was reamed followed by insertion of a keel punch. Tibial tray was then removed. The cut surfaces of bone were irrigated with copious amounts of normal saline with antibiotic solution using pulsatile lavage and then suctioned dry. Polymethyl methacrylate cement with gentamicin was prepared in the usual fashion using a vacuum mixer. Cement was applied to the cut surface of the proximal tibia as well as along the undersurface of a size 2 MBT tibial component. The tibial component was positioned and impacted into place. Excess cement was removed using [employer] fast streamer. Cement was then applied to the cut surface of the femur as well as along the posterior flanges of a size 2.5 posterior stabilized femoral component. Femoral component was positioned and impacted into place. Excess cement was removed using freer elevators. A 10 mm polyethylene trial was inserted and the knee was brought into full extension with steady axial compression applied. Finally, cement was applied to the backside of a 32 mm 3-pegged oval dome patella and the patellar component was positioned and patellar clamp applied. Excess cement was removed using freer elevators.   After adequate curing of the cement, the tourniquet was deflated after total tourniquet time of 95 minutes. Hemostasis was achieved using electrocautery. The knee was  irrigated with copious amounts of normal saline with antibiotic solution using pulsatile lavage and then suctioned dry. The knee was inspected for any residual cement debris. 20 mL of 1.3% Exparel and 40 mL of injectable normal saline was then injected along the posterior aspect of the joint as well as along the medial and lateral gutters and along the arthrotomy site. A 10 mm stabilized rotating platform polyethylene insert was inserted and the knee was placed through a range of motion. Excellent patellar tracking was appreciated and excellent medial and lateral soft tissue balancing was appreciated. Two medium drains were placed in the wound bed and brought out through a separate stab incision to be attached to a reinfusion system. The medial parapatellar portion of the incision was reapproximated using interrupted sutures of #1 Vicryl. The subcutaneous tissue was approximated in layers using first #0 Vicryl and subsequently #2-0 Vicryl. The subcutaneous tissue was then injected with 30 mL of 0.25% Marcaine with epinephrine. Skin edges were then reapproximated with skin staples. A sterile dressing was applied.   The patient tolerated the procedure well. She was transported to the recovery room in stable condition.  ____________________________ Laurice Record. Holley Bouche., MD jph:sb D: 11/06/2012 11:39:32 ET T: 11/06/2012 12:35:41 ET JOB#: [id number]  cc: Jeneen Rinks P. Holley Bouche., MD, <Dictator> Laurice Record Holley Bouche MD ELECTRONICALLY SIGNED 11/10/2012 18:06

## 2014-08-02 NOTE — Discharge Summary (Signed)
PATIENT NAME:  Vanessa Montes, Vanessa Montes MR#:  [id number] DATE OF BIRTH:  Jun 21, 1930  DATE OF ADMISSION:  11/06/2012 DATE OF DISCHARGE:  11/09/2012   ADMITTING DIAGNOSIS: Degenerative arthrosis of the right knee.   DISCHARGE DIAGNOSIS: Degenerative arthrosis of the right knee.  OPERATION: On 11/06/2012, the patient had a right total knee arthroplasty using computer-aided navigation.   SURGEON: Laurice Record. Holley Bouche., MD  ASSISTANT: April M. Tretha Sciara, NP  ANESTHESIA: Spinal.   ESTIMATED BLOOD LOSS: 50 mL.   TOURNIQUET TIME: 95 minutes.   IMPLANTS USED: A DePuy PFC Sigma size 2.5 posterior stabilized femoral component that was cemented, size 2 MBT tibial component that was cemented, a 32 mm 3-peg oval dome patella, and a 10 mm stabilized rotating platform polyethylene insert.   The patient was stabilized and then brought down to the orthopedic floor for pain control and physical therapy.   HISTORY: The patient is an 81 year old female who presented for upcoming total knee replacement. The patient has had significant difficulty involving her right lower extremity. The patient has had swelling and difficulty with ambulation. The patient has been refractory to conservative management with cortisone injections and Synvisc injections.   PHYSICAL EXAMINATION:  GENERAL: Well-developed female with antalgic gait and a varus thrust to both knees.  LUNGS: Clear to auscultation.  CARDIOVASCULAR: Normal rate and rhythm with no murmur.  MUSCULOSKELETAL: In regard to the right knee, the patient has full extension and 113 degrees of flexion. The patient has medial joint line tenderness. The patient has crepitus with motion. There is fairly good quadriceps control.   HOSPITAL COURSE: After initial admission on 11/06/2012, the patient was brought to the orthopedic floor. On postoperative day 1, the patient's hemoglobin was 9.7, on postoperative day 2, it was at 9.0 with no transfusions given. The patient ambulated with  physical therapy, initially bed to chair and progressed up to doing very mild ambulation in the room. The patient was sent to rehab on 11/09/2012.   CONDITION AT DISCHARGE: Stable.   DISPOSITION: The patient was sent to rehab.   DISCHARGE INSTRUCTIONS:  1. The patient will follow up at [facility] in 2 weeks for staple removal.  2. The patient's activity is weight bear as tolerated.  3. The patient will use thigh-high TED hose on both legs, to be removed 1 hour every 8-hour shift.  4. The patient will elevate her heels off the bed and be encouraged to do cough and deep breathing.  5. The patient's diet is regular.  6. The patient will have a dressing change as needed.  7. The patient will do occupational therapy and physical therapy per protocol.   DISCHARGE MEDICATIONS:  1. Neurontin 300 mg 1 capsule p.o. q.h.s.   2. Lovaza 1000 mg 2 capsules b.i.d. 3. Tylenol 500 mg 2 tablets q.6 hours as needed for pain. 4. Bumetanide 0.5 mg 1 tablet q.a.m.  5. Glimepiride 1 tablet q.a.m. 6. Pantoprazole 40 mg 1 tablet p.o. daily.  7. Atorvastatin 20 mg 1 tablet at q.h.s.  8. Carvedilol 12.5 mg 1 tablet b.i.d. 9. Methimazole 5 mg 1 tablet q.a.m.  10. Hyoscyamine extended-release 0.375 mg 1 tablet b.i.d.  11. Cymbalta 100 mg p.o. daily.  12. Loratadine 10 mg 1 tablet daily.  13. Aricept 10 mg p.o. daily.  14. Vitamin D 400 international units 1 capsule p.o. daily.  15. Oxycodone 5 mg 1 tablet p.o. q.4 hours p.r.n. for severe pain.  16. Milk of Magnesia 30 mL 2 times a  day or p.r.n. 17. Aluminum hydroxide 30 mL q.6 hours. 18. Lovenox 40 mg once a day for 14 days.  19. Insulin 100 units injectable.  20. Metoclopramide 1 tablet p.o. q.i.d. 21. Bisacodyl 10 mg suppository p.r.n.  22. Senokot-S 1 tablet p.o. b.i.d.   ____________________________ J. Reche Dixon, [place] jtm:OSi D: 11/09/2012 06:56:58 ET T: 11/09/2012 07:11:34 ET JOB#: [id number]  cc: J. Reche Dixon, [place], <Dictator> J  Aleysia Oltmann [facility] ELECTRONICALLY SIGNED 11/09/2012 13:50

## 2014-08-03 NOTE — Discharge Summary (Signed)
PATIENT NAME:  Vanessa Montes, Vanessa Montes MR#:  [id number] DATE OF BIRTH:  01-18-1931  DATE OF ADMISSION:  04/13/2013 DATE OF DISCHARGE:  04/17/2013  CONSULTANTS: Dr. Ubaldo Glassing from cardiology, Dr. Delana Meyer from vascular surgery.   PRIMARY CARE PHYSICIAN: Clayborn Bigness, MD  CHIEF COMPLAINT: Status post fall, possible syncope.   DISCHARGE DIAGNOSES: 1.  Fall, less likely syncope, secondary to hypokalemia and deconditioning.  2.  Hypokalemia.  3.  Dementia.  4.  Diabetes.  5.  Minimum troponin elevation, likely demand ischemia.  6.  Diabetes.  7.  Hypertension.  8.  Hyperlipidemia.  9.  Hyperthyroidism.  10.  Dementia.  11.  Carotid stenosis.  12.  Gastroesophageal reflux disease.  13.  History of anemia.   DISCHARGE MEDICATIONS:  1.  Gabapentin 300 mg once a day at bedtime. 2.  Lovaza 1000 mg 2 caps 2 times a day. 3.  Glimepiride 1 mg once a day. 4.  Pantoprazole 40 mg daily. 5.  Atorvastatin 20 mg daily. 6.  Carvedilol 12.5 mg in the morning and at bedtime. 7.  Methimazole 5 mg once a day. 8.  Cymbalta 100 mg once a day. 9.  Loratadine 10 mg once a day. 10.  Aricept 10 mg once a day. 11.  Vitamin D3 400 units once a day. 12.  Magnesium hydroxide 30 mL 2 times a day as needed.  13.  Metoclopramide 10 mg 4 times a day. 14.  Docusate/senna 1 tab 2 times a day. 15.  Plavix 75 mg daily. 16.  Fluticasone 2 sprays once a day. 17.  Montelukast 10 mg once a day. 18.  Tylenol 500 mg 2 tabs 2 times a day as needed for pain. 19.  Bumetanide 0.5 mg 1 tab once a day once approved by your doctor. 20.  Amlodipine 2.5 mg daily.   DIET: Low sodium, low fat, low cholesterol, ADA diet.   ACTIVITY: As tolerated.   DISCHARGE FOLLOWUP: Please follow with PCP within 1 to 2 weeks.   SIGNIFICANT LABS AND IMAGING: Initial potassium 2.1, sodium 139, creatinine 0.84.  Magnesium was 1.5 on admission. Initial troponin 0.016 then 0.06 then 0.05. CK-MB was within normal limits x3. Initial white count was 7.3,  hemoglobin 10.2, platelets 225. C. diff negative. UA did not suggest infection. CT of the head without contrast on admission showing no acute intracranial process, severe atrophy with chronic microvascular ischemic disease.   Ultrasound of the carotids bilaterally showing mild to moderate irregular and partially calcified plaque is noted in the proximal right internal carotid artery consistent with 50 to 69% stenosis, less than 50% stenosis in the left internal carotid artery.   HISTORY OF PRESENT ILLNESS AND HOSPITAL COURSE: For full details of H and P, please see the dictation on January 2 by Dr. Waldron Labs, but briefly this is an 81 year old with dementia, hyperlipidemia, hypertension, diabetes and GERD who was found down on the floor at her assisted living facility crawling. The patient was a poor historian. Per daughter the patient has had multiple falls in the past and likely had another fall, less likely syncopized. She was admitted to the hospitalist service. Initially she had a troponin of 0.06 and potassium came back at 2.1, severely low. Her bumetanide was held and the patient had repletion via p.o. and IV. She was admitted to the hospitalist service to telemetry with remote monitoring. She had no significant arrhythmias on the monitor. She had a CT of the head which did not show acute stroke. Vascular surgery was  consulted after abnormal ultrasound of the carotids. The patient was seen by Dr. Delana Meyer on the 2nd of January whom thought no further vascular work-up is needed at this time. Per Dr. Delana Meyer, the ultrasound of the carotids was consistent with the most recent ultrasound from the office. There is no hemodynamic significant stenosis. The potassium was repleted and the patient was seen by physical therapy. The patient's family was interested in a higher level of care and at this point the patient will be going to SNF facility. I believe that the fall was likely secondary to deconditioning,  weakness, dementia and hypokalemia with poor awareness of her surroundings secondary to advanced dementia. The minimum troponin elevation was likely demand ischemia and not acute coronary syndrome. At this point, she will be discharged with outpatient follow-up. The patient is FULL code.  TOTAL TIME SPENT: 40 minutes.   ____________________________ Vivien Presto, MD sa:sb D: 04/17/2013 15:19:35 ET T: 04/17/2013 15:41:16 ET JOB#: [id number]  cc: Vivien Presto, MD, <Dictator> Lavera Guise, MD Karel Jarvis [place] Day MD ELECTRONICALLY SIGNED 04/24/2013 11:33

## 2014-08-03 NOTE — H&P (Signed)
PATIENT NAME:  Vanessa Montes, Vanessa Montes MR#:  [id number] DATE OF BIRTH:  [birth year]  DATE OF ADMISSION:  04/13/2013  REFERRING PHYSICIAN: Valli Glance. Owens Shark, MD  PRIMARY CARE PHYSICIAN: Vance Peper, PA  CHIEF COMPLAINT: Fall/syncope.   HISTORY OF PRESENT ILLNESS: This is an 81 year old female with history of dementia, diabetes, hyperthyroidism, gastroesophageal reflux disease, who presents with a fall versus syncope. The patient has advanced dementia, cannot give any reliable history. She was found on the floor at the nursing home, crawling. The patient cannot give reliable history, unclear if she syncopized or fell. Daughter at bedside, and she gives most of the history. Her mother had a progressive decline in her mentation status post her right knee replacement surgery. Currently, reports she is minimally ambulatory, most of the time bedridden. She ambulates occasionally with a walker. Reports as well her dementia has been progressively worsening over the last few months. The patient had basic workup done in the ED. Her blood work was significant for severe hypokalemia at potassium 2.1, and she had borderline troponin at 0.06. The patient denies any chest pain. Her EKG showing left bundle branch block, which was old from previous EKGs. The patient's CT head without contrast did not show any acute findings, was only significant for severe atrophy with chronic microvascular ischemic disease. Hospitalist service was requested to admit the patient for further evaluation for her syncope and replacement of her potassium.   ALLERGIES: SULFA DRUGS.   HOME MEDICATIONS:  1. Aspirin 325 mg oral daily.  2. Tylenol 500 mg 2 tablets every 6 hours as needed.  3. Magnesium hydroxide 30 mL 2 times a day as needed.  4. Gabapentin 300 mg oral daily.  5. Cymbalta 100 mg oral daily.  6. Glyburide 1 mg oral daily.  7. Metoclopramide 10 mg oral 4 times a day.  8. Loratadine 10 mg oral daily.  9. Atorvastatin 20 mg oral daily.   10. Plavix 75 mg oral daily.  11. Methimazole 5 mg oral daily.  12. Coreg 12.5 mg oral daily.  13. Aricept 10 mg oral daily.  14. Coreg 12.5 mg oral 2 times a day.  15. Bumetanide 0.5 mg oral daily.  16. Docusate/senna oral 2 times a day.  17. Singulair 10 mg oral daily. 18. Lovaza 1000 mg oral 2 capsules 2 times a day.  19. Pantoprazole 40 mg oral daily.  20. Fluticasone 2 sprays nasal once a day.  21. Vitamin D3 400 international units oral daily.  PAST MEDICAL HISTORY: 1. Diabetes.  2. Hypertension.  3. Hyperthyroidism.  4. Anemia.  5. Gastroesophageal reflux disease.  6. Mild dementia.  7. Hyperlipidemia.   PAST SURGICAL HISTORY:  1. Cholecystectomy.  2. Hysterectomy.  3. Breast reduction.  4. Appendectomy.  5. Carotid endarterectomy.   SOCIAL HISTORY: Currently is a skilled nursing facility resident. Nonsmoker. No alcohol.   FAMILY HISTORY: Significant for coronary artery disease in her mother and father.   REVIEW OF SYSTEMS: We tried to obtain, but the patient is an extremely poor historian secondary to her dementia and cannot provide any reliable review of systems.   PHYSICAL EXAMINATION:  VITAL SIGNS: Temperature 98.1 pulse 65, respiratory rate 20, blood pressure 146/69, saturating 94% on room air.  GENERAL: An elderly female who looks comfortable in bed, in no apparent distress. HEENT: Head had mild bruise on the top of the right eyebrow. Normocephalic. Pupils equally reactive to light. Pink conjunctivae. Anicteric sclerae. Dry oral mucosa.  NECK: Supple. No thyromegaly. No JVD.  CHEST: Good air entry bilaterally. No wheezing, rales, rhonchi.  CARDIOVASCULAR: S1, S2 heard. No rubs, murmurs or gallops.  ABDOMEN: Soft, nontender, nondistended. Bowel sounds present.  EXTREMITIES: No edema. No clubbing. No cyanosis. Pedal pulses felt bilaterally.  PSYCHIATRIC: The patient is awake, alert x1, confused.  NEUROLOGIC: Cranial nerves grossly intact. Motor 5 out of 5,  moving all extremities, but has generalized weakness.  MUSCULOSKELETAL: Has no joint effusion or erythema. Has a scar in the right knee area of previous surgery.  SKIN: Dry, delayed skin turgor. The patient has a sacral pressure ulcer, stage II to III.  LYMPHATIC: No cervical lymphadenopathy could be appreciated.   PERTINENT LABORATORY DATA: Glucose 140, BUN 13, creatinine 0.84, sodium 139, potassium 2.1, chloride 98, CO2 40, ALT 18, AST 20, alkaline phosphatase 42, total protein 5.4, albumin 2.6. Total CK 35, CK-MB 0.7, troponin 0.06. White blood cells 7.3, hemoglobin 10.2, hematocrit 29.8, platelets 225. Urinalysis negative for leukocyte esterase and nitrite. EKG showing normal sinus rhythm with left bundle branch block at 64 beats per minute.   IMAGING: CT head without contrast showing no acute intracranial process, severe atrophy with chronic microvascular ischemic disease.   ASSESSMENT AND PLAN:  1. Fall versus syncope. Secondary to the patient's dementia, unclear if she had fall versus syncope, but given her clinical dehydration, severe hypokalemia and borderline troponin, the patient will be admitted for further evaluation. Will admit her to telemetry unit. Will continue to cycle her cardiac enzymes and follow the trend. She already received 324 of aspirin in the ED. Will check 2-D echo. Will check carotid Doppler. Will replace her potassium. Will have her on gentle hydration.  2. Hypokalemia. Will replace. As well, will check magnesium level.  3. History of hypertension, acceptable. Continue the patient on home medication.  4. Hyperthyroidism. Continue on methimazole.  5. Gastroesophageal reflux disease. Continue on PPI.  6. Mild dementia. Continue with Aricept.  7. Diabetes mellitus: Hold glyburide. Have her on insulin sliding scale.  8. Generalized weakness and protein calorie malnutrition. The patient is having a poor appetite. Her albumin is low. Total protein is low as well. Will have  her on nutritional supplement. Will consult physical therapy to see her.  9. Vitamin D deficiency. Continue with supplements.  10. Hyperlipidemia. Continue with Lovaza and statin.  11. Sacral pressure ulcer. Continue with pressure ulcer precautions. Address nutritional status.  12. Deep vein thrombosis prophylaxis. Will start the patient on subcutaneous heparin.   CODE STATUS: Discussed with the daughter. Reports her mother has a living will, and the daughter is her healthcare power of attorney, and she is a full code.   TOTAL TIME SPENT ON ADMISSION AND PATIENT CARE: 50 minutes.   ____________________________ Albertine Patricia, MD dse:lb D: 04/13/2013 07:44:28 ET T: 04/13/2013 08:02:42 ET JOB#: [id number]  cc: Albertine Patricia, MD, <Dictator> Chauncey Sciulli Graciela Husbands MD ELECTRONICALLY SIGNED 04/15/2013 4:54

## 2014-08-03 NOTE — Consult Note (Signed)
Brief Consult Note: Diagnosis: syncope and falls.   Patient was seen by consultant.   Comments: I have reviewed the duplex and it is consistent with the most recent ultrasond from the office.  There is no evidence of hemodynamically significant stenisis in the carotids or the vertebral arteries.  CEA site is wide open and the right is <60%.   Angiography prior to the endarterectomy did not show any stenosis of the great vessels  No further vascular workup at this time.  Electronic Signatures: Hortencia Pilar (MD)  (Signed 02-Jan-15 16:54)  Authored: Brief Consult Note   Last Updated: 02-Jan-15 16:54 by Hortencia Pilar (MD)

## 2014-08-03 NOTE — Consult Note (Signed)
Brief Consult Note: Diagnosis: probable mechanical fall in pt with dementia. Noted to be hypokalemic and had mild torponin elevation to 0.07.   Patient was seen by consultant.   Recommend further assessment or treatment.   Comments: 81 yo female who sufferred a fall which she states occurred when she slipped. She spent a fairly long period of time on the floor before being found by her daughter. She has nsr on ekg with no ischemia. Her troponin was minilally elevated to 0.07 consistant with demand due to fall and stress of being on the floor. K was 2.6. Repleat k. Follow on telemetry. echo to evaluate lv funciton. Further recs pending course. Not a candidate for invasive on noninvasive ischemic workup.  Electronic Signatures: Teodoro Spray (MD)  (Signed 02-Jan-15 15:09)  Authored: Brief Consult Note   Last Updated: 02-Jan-15 15:09 by Teodoro Spray (MD)

## 2014-08-04 NOTE — Op Note (Signed)
PATIENT NAME:  Vanessa Montes, Vanessa Montes MR#:  [id number] DATE OF BIRTH:  1930-05-14  DATE OF PROCEDURE:  05/07/2011  OPERATION PERFORMED: Left lower parathyroidectomy.   PREOPERATIVE DIAGNOSIS: Primary hyperparathyroidism.   POSTOPERATIVE DIAGNOSIS: Primary hyperparathyroidism secondary to left lower parathyroid adenoma, 500 mg.  SURGEON: Consuela Mimes, MD  PROCEDURE IN DETAIL: The patient was placed supine on the operating room table and prepped and draped in the usual sterile fashion. A 1-1/2 inch incision was made in the lines of the neck on the left side overlying the sternocleidomastoid muscle and this incision barely crossed the previous left carotid endarterectomy scar. This was carried down through the platysma and subplatysmal flaps were created superiorly and inferiorly and then dissection along the anterior border of the sternocleidomastoid muscle revealed the omohyoid. The omohyoid was completely dissected out so that it could be encircled and moved up or down. In an approach lateral to the other strap muscles the mid and inferior portion of the thyroid gland was identified and dissection of this revealed what I thought was a large left inferior parathyroid gland. It is possible that this gland was a superior gland as it was located more posteriorly and more in the mid area of the thyroid than is typical for an inferior gland. This gland was excised and sent for frozen section and seen to be hypercellular parathyroid tissue weighing 500 mg. Hemostasis was achieved throughout the procedure with electrocautery. The platysma was closed with a running 4-0 Monocryl suture. The skin was reapproximated with a running subcuticular 5-0 Monocryl, Mastisol, and Steri-Strips. The patient tolerated the procedure well. There were no complications.    ____________________________ Consuela Mimes, MD wfm:cms D: 05/07/2011 09:07:43 ET T: 05/07/2011 09:23:19 ET JOB#: [id number] cc: Consuela Mimes, MD,  <Dictator> A. Lavone Orn, MD Consuela Mimes MD ELECTRONICALLY SIGNED 05/08/2011 10:55

## 2014-08-13 DIAGNOSIS — H16143 Punctate keratitis, bilateral: Secondary | ICD-10-CM | POA: Diagnosis not present

## 2014-08-13 DIAGNOSIS — H43813 Vitreous degeneration, bilateral: Secondary | ICD-10-CM | POA: Diagnosis not present

## 2014-08-13 DIAGNOSIS — E119 Type 2 diabetes mellitus without complications: Secondary | ICD-10-CM | POA: Diagnosis not present

## 2014-08-13 DIAGNOSIS — H2512 Age-related nuclear cataract, left eye: Secondary | ICD-10-CM | POA: Diagnosis not present

## 2014-08-15 ENCOUNTER — Emergency Department
Admission: EM | Admit: 2014-08-15 | Discharge: 2014-08-15 | Disposition: A | Discharge status: Home / Self Care | Payer: Medicare Other | Attending: Emergency Medicine | Admitting: Emergency Medicine

## 2014-08-15 DIAGNOSIS — W06XXXA Fall from bed, initial encounter: Secondary | ICD-10-CM | POA: Diagnosis not present

## 2014-08-15 DIAGNOSIS — G8929 Other chronic pain: Secondary | ICD-10-CM | POA: Insufficient documentation

## 2014-08-15 DIAGNOSIS — Y9389 Activity, other specified: Secondary | ICD-10-CM | POA: Diagnosis not present

## 2014-08-15 DIAGNOSIS — S199XXA Unspecified injury of neck, initial encounter: Secondary | ICD-10-CM | POA: Insufficient documentation

## 2014-08-15 DIAGNOSIS — Y998 Other external cause status: Secondary | ICD-10-CM | POA: Insufficient documentation

## 2014-08-15 DIAGNOSIS — W19XXXA Unspecified fall, initial encounter: Secondary | ICD-10-CM | POA: Diagnosis not present

## 2014-08-15 DIAGNOSIS — Y9289 Other specified places as the place of occurrence of the external cause: Secondary | ICD-10-CM | POA: Diagnosis not present

## 2014-08-15 DIAGNOSIS — M542 Cervicalgia: Secondary | ICD-10-CM | POA: Diagnosis not present

## 2014-08-15 DIAGNOSIS — R531 Weakness: Secondary | ICD-10-CM | POA: Diagnosis not present

## 2014-08-15 NOTE — ED Notes (Signed)
Pt here after having a fall, pt is c/o neck pain. Pt has a hx of neck pain. No other complaints at this time

## 2014-08-15 NOTE — ED Notes (Signed)
Pt here from nursing home, pt is c/o neck pain. Pt has a hx of neck pain. Pt in NAD at this time will cont to monitor pt at all times.

## 2014-08-15 NOTE — ED Provider Notes (Signed)
[facility] Emergency Department Provider Note    ____________________________________________  Time seen: 1405  I have reviewed the triage vital signs and the nursing notes.   HISTORY  Chief Complaint Fall       HPI Vanessa Montes is a 81 y.o. female who stays at the [facility]. She finished lunch and went back to her room and was sitting on her bed. She is not sure if she fell off the bed or if she slipped down off the bed onto the floor. She reports she did not lose consciousness and work remembers all the events. She denies hitting her head. She does complain of neck pain similar to what she has had before. She has no pain of the extremities. No activities makes any portion of her body hurt more. She appears to have full range of motion. She did not have any syncope or presyncopal symptoms according to the patient. Of note, during my interview and physical exam the daughter arrived. The daughter reports that the patient is speaking and acting as she usually does. There are no acute changes.     No past medical history on file.  There are no active problems to display for this patient.   No past surgical history on file.  No current outpatient prescriptions on file.  Allergies Review of patient's allergies indicates not on file.  No family history on file.  Social History History  Substance Use Topics  . Smoking status: Not on file  . Smokeless tobacco: Not on file  . Alcohol Use: Not on file    Review of Systems  Constitutional: Negative for fever. Eyes: Negative for visual changes. ENT: Negative for sore throat. Cardiovascular: Negative for chest pain. Respiratory: Negative for shortness of breath. Gastrointestinal: Negative for abdominal pain, vomiting and diarrhea. Genitourinary: Negative for dysuria. Musculoskeletal: Patient does report that occasionally she has neck pain. Skin: Negative for rash. Neurological: Negative for  headaches, focal weakness or numbness.   10-point ROS otherwise negative.  ____________________________________________   PHYSICAL EXAM:  VITAL SIGNS: ED Triage Vitals  Enc Vitals Group     BP 08/15/14 1352 148/84 mmHg     Pulse Rate 08/15/14 1352 57     Resp 08/15/14 1352 16     Temp --      Temp src --      SpO2 08/15/14 1352 97 %     Weight --      Height --      Head Cir --      Peak Flow --      Pain Score --      Pain Loc --      Pain Edu? --      Excl. in [place]? --      Constitutional: Alert, pleasant, communicative, but possibly a mild amount of confusion. no distress. ENT   Head: Normocephalic and atraumatic.   Nose: No congestion/rhinnorhea.   Mouth/Throat: Mucous membranes are moist.   Neck: There is no focal tenderness. There is no midline tenderness. The patient has a good range of motion without any discomfort.  Cardiovascular: Normal rate, regular rhythm. Normal and symmetric distal pulses are present in all extremities. No murmurs, rubs, or gallops. Respiratory: Normal respiratory effort without tachypnea nor retractions. Breath sounds are clear and equal bilaterally. No wheezes/rales/rhonchi. Gastrointestinal: Soft and nontender. No distention. No abdominal bruits. There is no CVA tenderness. Musculoskeletal: Nontender with normal range of motion in all extremities. No joint effusions.  No  lower extremity tenderness nor edema.  Neurologic:  Normal speech and language. No gross focal neurologic deficits are appreciated. Speech is normal. No gait instability. Skin:  Skin is warm, dry and intact. No rash noted. Psychiatric: Mood and affect are normal. Speech and behavior are normal. Patient is at her baseline.  ____________________________________________    LABS (pertinent positives/negatives)  None obtained, none indicated.   ____________________________________________   EKG    ____________________________________________     RADIOLOGY    ____________________________________________   PROCEDURES  Procedure(s) performed: None  Critical Care performed: No  ____________________________________________   INITIAL IMPRESSION / ASSESSMENT AND PLAN / ED COURSE patient appears to have had an uneventful fall or slipping off of her bed at the [facility]. She denies hitting her head. She had no loss of consciousness. She has no focal tenderness. She is at her baseline according to both the patient and her daughter. No x-rays images or lab tests were indicated. I have discussed this with the daughter and she agrees to transport her mother back to the [facility].   ____________________________________________   FINAL CLINICAL IMPRESSION(S) / ED DIAGNOSES  Final diagnoses:  Fall, initial encounter  Neck pain, chronic     Ahmed Prima, MD 08/15/14 [phone]

## 2014-08-15 NOTE — Discharge Instructions (Signed)
You did not lose consciousness with this fall. You report that you did not hit her head. There is no change in pain. You have good range of motion in arms legs and neck. We discussed x-rays which were not indicated. Do follow-up with your regular doctor. Return to the emergency department if he had any urgent concerns.

## 2014-09-05 DIAGNOSIS — H16143 Punctate keratitis, bilateral: Secondary | ICD-10-CM | POA: Diagnosis not present

## 2014-09-05 DIAGNOSIS — H04123 Dry eye syndrome of bilateral lacrimal glands: Secondary | ICD-10-CM | POA: Diagnosis not present

## 2014-09-12 DIAGNOSIS — M6281 Muscle weakness (generalized): Secondary | ICD-10-CM | POA: Diagnosis not present

## 2014-09-12 DIAGNOSIS — E11649 Type 2 diabetes mellitus with hypoglycemia without coma: Secondary | ICD-10-CM | POA: Diagnosis not present

## 2014-09-12 DIAGNOSIS — F331 Major depressive disorder, recurrent, moderate: Secondary | ICD-10-CM | POA: Diagnosis not present

## 2014-09-12 DIAGNOSIS — J309 Allergic rhinitis, unspecified: Secondary | ICD-10-CM | POA: Diagnosis not present

## 2014-09-12 DIAGNOSIS — I10 Essential (primary) hypertension: Secondary | ICD-10-CM | POA: Diagnosis not present

## 2014-09-12 DIAGNOSIS — F0280 Dementia in other diseases classified elsewhere without behavioral disturbance: Secondary | ICD-10-CM | POA: Diagnosis not present

## 2014-10-17 DIAGNOSIS — J309 Allergic rhinitis, unspecified: Secondary | ICD-10-CM | POA: Diagnosis not present

## 2014-10-17 DIAGNOSIS — M6281 Muscle weakness (generalized): Secondary | ICD-10-CM | POA: Diagnosis not present

## 2014-10-17 DIAGNOSIS — M150 Primary generalized (osteo)arthritis: Secondary | ICD-10-CM | POA: Diagnosis not present

## 2014-10-17 DIAGNOSIS — I10 Essential (primary) hypertension: Secondary | ICD-10-CM | POA: Diagnosis not present

## 2014-10-17 DIAGNOSIS — F329 Major depressive disorder, single episode, unspecified: Secondary | ICD-10-CM | POA: Diagnosis not present

## 2014-10-17 DIAGNOSIS — G20 Parkinson's disease: Secondary | ICD-10-CM | POA: Diagnosis not present

## 2014-10-17 DIAGNOSIS — Z0001 Encounter for general adult medical examination with abnormal findings: Secondary | ICD-10-CM | POA: Diagnosis not present

## 2014-10-17 DIAGNOSIS — D351 Benign neoplasm of parathyroid gland: Secondary | ICD-10-CM | POA: Diagnosis not present

## 2014-11-01 DIAGNOSIS — N390 Urinary tract infection, site not specified: Secondary | ICD-10-CM | POA: Diagnosis not present

## 2014-11-04 DIAGNOSIS — E119 Type 2 diabetes mellitus without complications: Secondary | ICD-10-CM | POA: Diagnosis not present

## 2014-11-04 DIAGNOSIS — F0390 Unspecified dementia without behavioral disturbance: Secondary | ICD-10-CM | POA: Diagnosis not present

## 2014-11-04 DIAGNOSIS — M6281 Muscle weakness (generalized): Secondary | ICD-10-CM | POA: Diagnosis not present

## 2014-11-04 DIAGNOSIS — Z9181 History of falling: Secondary | ICD-10-CM | POA: Diagnosis not present

## 2014-11-04 DIAGNOSIS — I10 Essential (primary) hypertension: Secondary | ICD-10-CM | POA: Diagnosis not present

## 2014-11-06 DIAGNOSIS — E119 Type 2 diabetes mellitus without complications: Secondary | ICD-10-CM | POA: Diagnosis not present

## 2014-11-06 DIAGNOSIS — M6281 Muscle weakness (generalized): Secondary | ICD-10-CM | POA: Diagnosis not present

## 2014-11-06 DIAGNOSIS — F0390 Unspecified dementia without behavioral disturbance: Secondary | ICD-10-CM | POA: Diagnosis not present

## 2014-11-06 DIAGNOSIS — Z9181 History of falling: Secondary | ICD-10-CM | POA: Diagnosis not present

## 2014-11-06 DIAGNOSIS — I10 Essential (primary) hypertension: Secondary | ICD-10-CM | POA: Diagnosis not present

## 2014-11-08 DIAGNOSIS — Z9181 History of falling: Secondary | ICD-10-CM | POA: Diagnosis not present

## 2014-11-08 DIAGNOSIS — M6281 Muscle weakness (generalized): Secondary | ICD-10-CM | POA: Diagnosis not present

## 2014-11-08 DIAGNOSIS — F0390 Unspecified dementia without behavioral disturbance: Secondary | ICD-10-CM | POA: Diagnosis not present

## 2014-11-08 DIAGNOSIS — E119 Type 2 diabetes mellitus without complications: Secondary | ICD-10-CM | POA: Diagnosis not present

## 2014-11-08 DIAGNOSIS — I10 Essential (primary) hypertension: Secondary | ICD-10-CM | POA: Diagnosis not present

## 2014-11-11 DIAGNOSIS — Z9181 History of falling: Secondary | ICD-10-CM | POA: Diagnosis not present

## 2014-11-11 DIAGNOSIS — E119 Type 2 diabetes mellitus without complications: Secondary | ICD-10-CM | POA: Diagnosis not present

## 2014-11-11 DIAGNOSIS — M6281 Muscle weakness (generalized): Secondary | ICD-10-CM | POA: Diagnosis not present

## 2014-11-11 DIAGNOSIS — F0390 Unspecified dementia without behavioral disturbance: Secondary | ICD-10-CM | POA: Diagnosis not present

## 2014-11-11 DIAGNOSIS — I10 Essential (primary) hypertension: Secondary | ICD-10-CM | POA: Diagnosis not present

## 2014-11-12 DIAGNOSIS — Z9181 History of falling: Secondary | ICD-10-CM | POA: Diagnosis not present

## 2014-11-12 DIAGNOSIS — E119 Type 2 diabetes mellitus without complications: Secondary | ICD-10-CM | POA: Diagnosis not present

## 2014-11-12 DIAGNOSIS — F0390 Unspecified dementia without behavioral disturbance: Secondary | ICD-10-CM | POA: Diagnosis not present

## 2014-11-12 DIAGNOSIS — M6281 Muscle weakness (generalized): Secondary | ICD-10-CM | POA: Diagnosis not present

## 2014-11-12 DIAGNOSIS — I10 Essential (primary) hypertension: Secondary | ICD-10-CM | POA: Diagnosis not present

## 2014-11-14 DIAGNOSIS — E119 Type 2 diabetes mellitus without complications: Secondary | ICD-10-CM | POA: Diagnosis not present

## 2014-11-14 DIAGNOSIS — F0390 Unspecified dementia without behavioral disturbance: Secondary | ICD-10-CM | POA: Diagnosis not present

## 2014-11-14 DIAGNOSIS — M6281 Muscle weakness (generalized): Secondary | ICD-10-CM | POA: Diagnosis not present

## 2014-11-14 DIAGNOSIS — Z9181 History of falling: Secondary | ICD-10-CM | POA: Diagnosis not present

## 2014-11-14 DIAGNOSIS — I10 Essential (primary) hypertension: Secondary | ICD-10-CM | POA: Diagnosis not present

## 2014-11-18 DIAGNOSIS — I10 Essential (primary) hypertension: Secondary | ICD-10-CM | POA: Diagnosis not present

## 2014-11-18 DIAGNOSIS — M6281 Muscle weakness (generalized): Secondary | ICD-10-CM | POA: Diagnosis not present

## 2014-11-18 DIAGNOSIS — Z9181 History of falling: Secondary | ICD-10-CM | POA: Diagnosis not present

## 2014-11-18 DIAGNOSIS — E119 Type 2 diabetes mellitus without complications: Secondary | ICD-10-CM | POA: Diagnosis not present

## 2014-11-18 DIAGNOSIS — F0390 Unspecified dementia without behavioral disturbance: Secondary | ICD-10-CM | POA: Diagnosis not present

## 2014-11-19 DIAGNOSIS — F0390 Unspecified dementia without behavioral disturbance: Secondary | ICD-10-CM | POA: Diagnosis not present

## 2014-11-19 DIAGNOSIS — Z9181 History of falling: Secondary | ICD-10-CM | POA: Diagnosis not present

## 2014-11-19 DIAGNOSIS — M6281 Muscle weakness (generalized): Secondary | ICD-10-CM | POA: Diagnosis not present

## 2014-11-19 DIAGNOSIS — E119 Type 2 diabetes mellitus without complications: Secondary | ICD-10-CM | POA: Diagnosis not present

## 2014-11-19 DIAGNOSIS — I10 Essential (primary) hypertension: Secondary | ICD-10-CM | POA: Diagnosis not present

## 2014-11-22 DIAGNOSIS — Z9181 History of falling: Secondary | ICD-10-CM | POA: Diagnosis not present

## 2014-11-22 DIAGNOSIS — I10 Essential (primary) hypertension: Secondary | ICD-10-CM | POA: Diagnosis not present

## 2014-11-22 DIAGNOSIS — F0390 Unspecified dementia without behavioral disturbance: Secondary | ICD-10-CM | POA: Diagnosis not present

## 2014-11-22 DIAGNOSIS — E119 Type 2 diabetes mellitus without complications: Secondary | ICD-10-CM | POA: Diagnosis not present

## 2014-11-22 DIAGNOSIS — M6281 Muscle weakness (generalized): Secondary | ICD-10-CM | POA: Diagnosis not present

## 2014-11-25 DIAGNOSIS — Z9181 History of falling: Secondary | ICD-10-CM | POA: Diagnosis not present

## 2014-11-25 DIAGNOSIS — F0390 Unspecified dementia without behavioral disturbance: Secondary | ICD-10-CM | POA: Diagnosis not present

## 2014-11-25 DIAGNOSIS — I10 Essential (primary) hypertension: Secondary | ICD-10-CM | POA: Diagnosis not present

## 2014-11-25 DIAGNOSIS — M6281 Muscle weakness (generalized): Secondary | ICD-10-CM | POA: Diagnosis not present

## 2014-11-25 DIAGNOSIS — E119 Type 2 diabetes mellitus without complications: Secondary | ICD-10-CM | POA: Diagnosis not present

## 2014-11-26 DIAGNOSIS — F0390 Unspecified dementia without behavioral disturbance: Secondary | ICD-10-CM | POA: Diagnosis not present

## 2014-11-26 DIAGNOSIS — E119 Type 2 diabetes mellitus without complications: Secondary | ICD-10-CM | POA: Diagnosis not present

## 2014-11-26 DIAGNOSIS — I10 Essential (primary) hypertension: Secondary | ICD-10-CM | POA: Diagnosis not present

## 2014-11-26 DIAGNOSIS — J309 Allergic rhinitis, unspecified: Secondary | ICD-10-CM | POA: Diagnosis not present

## 2014-11-26 DIAGNOSIS — H1089 Other conjunctivitis: Secondary | ICD-10-CM | POA: Diagnosis not present

## 2014-11-26 DIAGNOSIS — F331 Major depressive disorder, recurrent, moderate: Secondary | ICD-10-CM | POA: Diagnosis not present

## 2014-11-26 DIAGNOSIS — M6281 Muscle weakness (generalized): Secondary | ICD-10-CM | POA: Diagnosis not present

## 2014-11-26 DIAGNOSIS — M533 Sacrococcygeal disorders, not elsewhere classified: Secondary | ICD-10-CM | POA: Diagnosis not present

## 2014-11-26 DIAGNOSIS — Z9181 History of falling: Secondary | ICD-10-CM | POA: Diagnosis not present

## 2014-11-26 DIAGNOSIS — F0280 Dementia in other diseases classified elsewhere without behavioral disturbance: Secondary | ICD-10-CM | POA: Diagnosis not present

## 2014-11-26 DIAGNOSIS — G20 Parkinson's disease: Secondary | ICD-10-CM | POA: Diagnosis not present

## 2014-11-27 DIAGNOSIS — E119 Type 2 diabetes mellitus without complications: Secondary | ICD-10-CM | POA: Diagnosis not present

## 2014-11-27 DIAGNOSIS — M6281 Muscle weakness (generalized): Secondary | ICD-10-CM | POA: Diagnosis not present

## 2014-11-27 DIAGNOSIS — F0390 Unspecified dementia without behavioral disturbance: Secondary | ICD-10-CM | POA: Diagnosis not present

## 2014-11-27 DIAGNOSIS — Z9181 History of falling: Secondary | ICD-10-CM | POA: Diagnosis not present

## 2014-11-27 DIAGNOSIS — I10 Essential (primary) hypertension: Secondary | ICD-10-CM | POA: Diagnosis not present

## 2014-11-29 DIAGNOSIS — F0390 Unspecified dementia without behavioral disturbance: Secondary | ICD-10-CM | POA: Diagnosis not present

## 2014-11-29 DIAGNOSIS — M6281 Muscle weakness (generalized): Secondary | ICD-10-CM | POA: Diagnosis not present

## 2014-11-29 DIAGNOSIS — I10 Essential (primary) hypertension: Secondary | ICD-10-CM | POA: Diagnosis not present

## 2014-11-29 DIAGNOSIS — Z9181 History of falling: Secondary | ICD-10-CM | POA: Diagnosis not present

## 2014-11-29 DIAGNOSIS — E119 Type 2 diabetes mellitus without complications: Secondary | ICD-10-CM | POA: Diagnosis not present

## 2014-12-03 DIAGNOSIS — Z9181 History of falling: Secondary | ICD-10-CM | POA: Diagnosis not present

## 2014-12-03 DIAGNOSIS — M6281 Muscle weakness (generalized): Secondary | ICD-10-CM | POA: Diagnosis not present

## 2014-12-03 DIAGNOSIS — E119 Type 2 diabetes mellitus without complications: Secondary | ICD-10-CM | POA: Diagnosis not present

## 2014-12-03 DIAGNOSIS — F0390 Unspecified dementia without behavioral disturbance: Secondary | ICD-10-CM | POA: Diagnosis not present

## 2014-12-03 DIAGNOSIS — I10 Essential (primary) hypertension: Secondary | ICD-10-CM | POA: Diagnosis not present

## 2014-12-05 DIAGNOSIS — F0390 Unspecified dementia without behavioral disturbance: Secondary | ICD-10-CM | POA: Diagnosis not present

## 2014-12-05 DIAGNOSIS — E119 Type 2 diabetes mellitus without complications: Secondary | ICD-10-CM | POA: Diagnosis not present

## 2014-12-05 DIAGNOSIS — I10 Essential (primary) hypertension: Secondary | ICD-10-CM | POA: Diagnosis not present

## 2014-12-05 DIAGNOSIS — Z9181 History of falling: Secondary | ICD-10-CM | POA: Diagnosis not present

## 2014-12-05 DIAGNOSIS — M6281 Muscle weakness (generalized): Secondary | ICD-10-CM | POA: Diagnosis not present

## 2014-12-06 DIAGNOSIS — Z9181 History of falling: Secondary | ICD-10-CM | POA: Diagnosis not present

## 2014-12-06 DIAGNOSIS — E119 Type 2 diabetes mellitus without complications: Secondary | ICD-10-CM | POA: Diagnosis not present

## 2014-12-06 DIAGNOSIS — I10 Essential (primary) hypertension: Secondary | ICD-10-CM | POA: Diagnosis not present

## 2014-12-06 DIAGNOSIS — M6281 Muscle weakness (generalized): Secondary | ICD-10-CM | POA: Diagnosis not present

## 2014-12-06 DIAGNOSIS — F0390 Unspecified dementia without behavioral disturbance: Secondary | ICD-10-CM | POA: Diagnosis not present

## 2014-12-09 DIAGNOSIS — M6281 Muscle weakness (generalized): Secondary | ICD-10-CM | POA: Diagnosis not present

## 2014-12-09 DIAGNOSIS — Z9181 History of falling: Secondary | ICD-10-CM | POA: Diagnosis not present

## 2014-12-09 DIAGNOSIS — F0390 Unspecified dementia without behavioral disturbance: Secondary | ICD-10-CM | POA: Diagnosis not present

## 2014-12-09 DIAGNOSIS — E119 Type 2 diabetes mellitus without complications: Secondary | ICD-10-CM | POA: Diagnosis not present

## 2014-12-09 DIAGNOSIS — I10 Essential (primary) hypertension: Secondary | ICD-10-CM | POA: Diagnosis not present

## 2014-12-11 DIAGNOSIS — I10 Essential (primary) hypertension: Secondary | ICD-10-CM | POA: Diagnosis not present

## 2014-12-11 DIAGNOSIS — M6281 Muscle weakness (generalized): Secondary | ICD-10-CM | POA: Diagnosis not present

## 2014-12-11 DIAGNOSIS — E119 Type 2 diabetes mellitus without complications: Secondary | ICD-10-CM | POA: Diagnosis not present

## 2014-12-11 DIAGNOSIS — Z9181 History of falling: Secondary | ICD-10-CM | POA: Diagnosis not present

## 2014-12-11 DIAGNOSIS — F0390 Unspecified dementia without behavioral disturbance: Secondary | ICD-10-CM | POA: Diagnosis not present

## 2014-12-16 DIAGNOSIS — E119 Type 2 diabetes mellitus without complications: Secondary | ICD-10-CM | POA: Diagnosis not present

## 2014-12-16 DIAGNOSIS — I10 Essential (primary) hypertension: Secondary | ICD-10-CM | POA: Diagnosis not present

## 2014-12-16 DIAGNOSIS — F0390 Unspecified dementia without behavioral disturbance: Secondary | ICD-10-CM | POA: Diagnosis not present

## 2014-12-16 DIAGNOSIS — Z9181 History of falling: Secondary | ICD-10-CM | POA: Diagnosis not present

## 2014-12-16 DIAGNOSIS — M6281 Muscle weakness (generalized): Secondary | ICD-10-CM | POA: Diagnosis not present

## 2014-12-17 DIAGNOSIS — Z9181 History of falling: Secondary | ICD-10-CM | POA: Diagnosis not present

## 2014-12-17 DIAGNOSIS — I10 Essential (primary) hypertension: Secondary | ICD-10-CM | POA: Diagnosis not present

## 2014-12-17 DIAGNOSIS — F0390 Unspecified dementia without behavioral disturbance: Secondary | ICD-10-CM | POA: Diagnosis not present

## 2014-12-17 DIAGNOSIS — M6281 Muscle weakness (generalized): Secondary | ICD-10-CM | POA: Diagnosis not present

## 2014-12-17 DIAGNOSIS — E119 Type 2 diabetes mellitus without complications: Secondary | ICD-10-CM | POA: Diagnosis not present

## 2014-12-20 DIAGNOSIS — M6281 Muscle weakness (generalized): Secondary | ICD-10-CM | POA: Diagnosis not present

## 2014-12-20 DIAGNOSIS — Z9181 History of falling: Secondary | ICD-10-CM | POA: Diagnosis not present

## 2014-12-20 DIAGNOSIS — E119 Type 2 diabetes mellitus without complications: Secondary | ICD-10-CM | POA: Diagnosis not present

## 2014-12-20 DIAGNOSIS — F0390 Unspecified dementia without behavioral disturbance: Secondary | ICD-10-CM | POA: Diagnosis not present

## 2014-12-20 DIAGNOSIS — I10 Essential (primary) hypertension: Secondary | ICD-10-CM | POA: Diagnosis not present

## 2014-12-21 DIAGNOSIS — Z9181 History of falling: Secondary | ICD-10-CM | POA: Diagnosis not present

## 2014-12-21 DIAGNOSIS — I10 Essential (primary) hypertension: Secondary | ICD-10-CM | POA: Diagnosis not present

## 2014-12-21 DIAGNOSIS — F0390 Unspecified dementia without behavioral disturbance: Secondary | ICD-10-CM | POA: Diagnosis not present

## 2014-12-21 DIAGNOSIS — M6281 Muscle weakness (generalized): Secondary | ICD-10-CM | POA: Diagnosis not present

## 2014-12-21 DIAGNOSIS — E119 Type 2 diabetes mellitus without complications: Secondary | ICD-10-CM | POA: Diagnosis not present

## 2014-12-23 DIAGNOSIS — I10 Essential (primary) hypertension: Secondary | ICD-10-CM | POA: Diagnosis not present

## 2014-12-23 DIAGNOSIS — E119 Type 2 diabetes mellitus without complications: Secondary | ICD-10-CM | POA: Diagnosis not present

## 2014-12-23 DIAGNOSIS — Z9181 History of falling: Secondary | ICD-10-CM | POA: Diagnosis not present

## 2014-12-23 DIAGNOSIS — F0390 Unspecified dementia without behavioral disturbance: Secondary | ICD-10-CM | POA: Diagnosis not present

## 2014-12-23 DIAGNOSIS — M6281 Muscle weakness (generalized): Secondary | ICD-10-CM | POA: Diagnosis not present

## 2014-12-27 DIAGNOSIS — F0390 Unspecified dementia without behavioral disturbance: Secondary | ICD-10-CM | POA: Diagnosis not present

## 2014-12-27 DIAGNOSIS — I10 Essential (primary) hypertension: Secondary | ICD-10-CM | POA: Diagnosis not present

## 2014-12-27 DIAGNOSIS — Z9181 History of falling: Secondary | ICD-10-CM | POA: Diagnosis not present

## 2014-12-27 DIAGNOSIS — E119 Type 2 diabetes mellitus without complications: Secondary | ICD-10-CM | POA: Diagnosis not present

## 2014-12-27 DIAGNOSIS — M6281 Muscle weakness (generalized): Secondary | ICD-10-CM | POA: Diagnosis not present

## 2015-01-01 DIAGNOSIS — M6281 Muscle weakness (generalized): Secondary | ICD-10-CM | POA: Diagnosis not present

## 2015-01-01 DIAGNOSIS — E119 Type 2 diabetes mellitus without complications: Secondary | ICD-10-CM | POA: Diagnosis not present

## 2015-01-01 DIAGNOSIS — F0390 Unspecified dementia without behavioral disturbance: Secondary | ICD-10-CM | POA: Diagnosis not present

## 2015-01-01 DIAGNOSIS — Z9181 History of falling: Secondary | ICD-10-CM | POA: Diagnosis not present

## 2015-01-01 DIAGNOSIS — I10 Essential (primary) hypertension: Secondary | ICD-10-CM | POA: Diagnosis not present

## 2015-01-03 DIAGNOSIS — Z9181 History of falling: Secondary | ICD-10-CM | POA: Diagnosis not present

## 2015-01-03 DIAGNOSIS — R2689 Other abnormalities of gait and mobility: Secondary | ICD-10-CM | POA: Diagnosis not present

## 2015-01-03 DIAGNOSIS — Z7901 Long term (current) use of anticoagulants: Secondary | ICD-10-CM | POA: Diagnosis not present

## 2015-01-03 DIAGNOSIS — F0390 Unspecified dementia without behavioral disturbance: Secondary | ICD-10-CM | POA: Diagnosis not present

## 2015-01-03 DIAGNOSIS — I10 Essential (primary) hypertension: Secondary | ICD-10-CM | POA: Diagnosis not present

## 2015-01-03 DIAGNOSIS — E119 Type 2 diabetes mellitus without complications: Secondary | ICD-10-CM | POA: Diagnosis not present

## 2015-01-06 DIAGNOSIS — Z7901 Long term (current) use of anticoagulants: Secondary | ICD-10-CM | POA: Diagnosis not present

## 2015-01-06 DIAGNOSIS — I10 Essential (primary) hypertension: Secondary | ICD-10-CM | POA: Diagnosis not present

## 2015-01-06 DIAGNOSIS — F0390 Unspecified dementia without behavioral disturbance: Secondary | ICD-10-CM | POA: Diagnosis not present

## 2015-01-06 DIAGNOSIS — E119 Type 2 diabetes mellitus without complications: Secondary | ICD-10-CM | POA: Diagnosis not present

## 2015-01-06 DIAGNOSIS — R2689 Other abnormalities of gait and mobility: Secondary | ICD-10-CM | POA: Diagnosis not present

## 2015-01-06 DIAGNOSIS — Z9181 History of falling: Secondary | ICD-10-CM | POA: Diagnosis not present

## 2015-01-08 DIAGNOSIS — Z7901 Long term (current) use of anticoagulants: Secondary | ICD-10-CM | POA: Diagnosis not present

## 2015-01-08 DIAGNOSIS — I10 Essential (primary) hypertension: Secondary | ICD-10-CM | POA: Diagnosis not present

## 2015-01-08 DIAGNOSIS — F0390 Unspecified dementia without behavioral disturbance: Secondary | ICD-10-CM | POA: Diagnosis not present

## 2015-01-08 DIAGNOSIS — R2689 Other abnormalities of gait and mobility: Secondary | ICD-10-CM | POA: Diagnosis not present

## 2015-01-08 DIAGNOSIS — E119 Type 2 diabetes mellitus without complications: Secondary | ICD-10-CM | POA: Diagnosis not present

## 2015-01-08 DIAGNOSIS — Z9181 History of falling: Secondary | ICD-10-CM | POA: Diagnosis not present

## 2015-01-13 DIAGNOSIS — E119 Type 2 diabetes mellitus without complications: Secondary | ICD-10-CM | POA: Diagnosis not present

## 2015-01-13 DIAGNOSIS — Z7901 Long term (current) use of anticoagulants: Secondary | ICD-10-CM | POA: Diagnosis not present

## 2015-01-13 DIAGNOSIS — F0390 Unspecified dementia without behavioral disturbance: Secondary | ICD-10-CM | POA: Diagnosis not present

## 2015-01-13 DIAGNOSIS — Z9181 History of falling: Secondary | ICD-10-CM | POA: Diagnosis not present

## 2015-01-13 DIAGNOSIS — I10 Essential (primary) hypertension: Secondary | ICD-10-CM | POA: Diagnosis not present

## 2015-01-13 DIAGNOSIS — R2689 Other abnormalities of gait and mobility: Secondary | ICD-10-CM | POA: Diagnosis not present

## 2015-01-16 DIAGNOSIS — G20 Parkinson's disease: Secondary | ICD-10-CM | POA: Diagnosis not present

## 2015-01-17 DIAGNOSIS — F0390 Unspecified dementia without behavioral disturbance: Secondary | ICD-10-CM | POA: Diagnosis not present

## 2015-01-17 DIAGNOSIS — R2689 Other abnormalities of gait and mobility: Secondary | ICD-10-CM | POA: Diagnosis not present

## 2015-01-17 DIAGNOSIS — E119 Type 2 diabetes mellitus without complications: Secondary | ICD-10-CM | POA: Diagnosis not present

## 2015-01-17 DIAGNOSIS — I10 Essential (primary) hypertension: Secondary | ICD-10-CM | POA: Diagnosis not present

## 2015-01-17 DIAGNOSIS — Z7901 Long term (current) use of anticoagulants: Secondary | ICD-10-CM | POA: Diagnosis not present

## 2015-01-17 DIAGNOSIS — Z9181 History of falling: Secondary | ICD-10-CM | POA: Diagnosis not present

## 2015-01-20 DIAGNOSIS — I10 Essential (primary) hypertension: Secondary | ICD-10-CM | POA: Diagnosis not present

## 2015-01-20 DIAGNOSIS — Z7901 Long term (current) use of anticoagulants: Secondary | ICD-10-CM | POA: Diagnosis not present

## 2015-01-20 DIAGNOSIS — E119 Type 2 diabetes mellitus without complications: Secondary | ICD-10-CM | POA: Diagnosis not present

## 2015-01-20 DIAGNOSIS — R2689 Other abnormalities of gait and mobility: Secondary | ICD-10-CM | POA: Diagnosis not present

## 2015-01-20 DIAGNOSIS — Z9181 History of falling: Secondary | ICD-10-CM | POA: Diagnosis not present

## 2015-01-20 DIAGNOSIS — F0390 Unspecified dementia without behavioral disturbance: Secondary | ICD-10-CM | POA: Diagnosis not present

## 2015-01-22 DIAGNOSIS — E119 Type 2 diabetes mellitus without complications: Secondary | ICD-10-CM | POA: Diagnosis not present

## 2015-01-22 DIAGNOSIS — Z7901 Long term (current) use of anticoagulants: Secondary | ICD-10-CM | POA: Diagnosis not present

## 2015-01-22 DIAGNOSIS — I10 Essential (primary) hypertension: Secondary | ICD-10-CM | POA: Diagnosis not present

## 2015-01-22 DIAGNOSIS — R2689 Other abnormalities of gait and mobility: Secondary | ICD-10-CM | POA: Diagnosis not present

## 2015-01-22 DIAGNOSIS — Z9181 History of falling: Secondary | ICD-10-CM | POA: Diagnosis not present

## 2015-01-22 DIAGNOSIS — F0390 Unspecified dementia without behavioral disturbance: Secondary | ICD-10-CM | POA: Diagnosis not present

## 2015-01-23 DIAGNOSIS — E039 Hypothyroidism, unspecified: Secondary | ICD-10-CM | POA: Diagnosis not present

## 2015-01-23 DIAGNOSIS — J309 Allergic rhinitis, unspecified: Secondary | ICD-10-CM | POA: Diagnosis not present

## 2015-01-23 DIAGNOSIS — E119 Type 2 diabetes mellitus without complications: Secondary | ICD-10-CM | POA: Diagnosis not present

## 2015-01-23 DIAGNOSIS — J3481 Nasal mucositis (ulcerative): Secondary | ICD-10-CM | POA: Diagnosis not present

## 2015-01-23 DIAGNOSIS — Z23 Encounter for immunization: Secondary | ICD-10-CM | POA: Diagnosis not present

## 2015-01-23 DIAGNOSIS — M6281 Muscle weakness (generalized): Secondary | ICD-10-CM | POA: Diagnosis not present

## 2015-01-23 DIAGNOSIS — G20 Parkinson's disease: Secondary | ICD-10-CM | POA: Diagnosis not present

## 2015-01-27 DIAGNOSIS — Z9181 History of falling: Secondary | ICD-10-CM | POA: Diagnosis not present

## 2015-01-27 DIAGNOSIS — Z7901 Long term (current) use of anticoagulants: Secondary | ICD-10-CM | POA: Diagnosis not present

## 2015-01-27 DIAGNOSIS — I10 Essential (primary) hypertension: Secondary | ICD-10-CM | POA: Diagnosis not present

## 2015-01-27 DIAGNOSIS — F0390 Unspecified dementia without behavioral disturbance: Secondary | ICD-10-CM | POA: Diagnosis not present

## 2015-01-27 DIAGNOSIS — R2689 Other abnormalities of gait and mobility: Secondary | ICD-10-CM | POA: Diagnosis not present

## 2015-01-27 DIAGNOSIS — E119 Type 2 diabetes mellitus without complications: Secondary | ICD-10-CM | POA: Diagnosis not present

## 2015-01-29 DIAGNOSIS — F0390 Unspecified dementia without behavioral disturbance: Secondary | ICD-10-CM | POA: Diagnosis not present

## 2015-01-29 DIAGNOSIS — Z7901 Long term (current) use of anticoagulants: Secondary | ICD-10-CM | POA: Diagnosis not present

## 2015-01-29 DIAGNOSIS — R2689 Other abnormalities of gait and mobility: Secondary | ICD-10-CM | POA: Diagnosis not present

## 2015-01-29 DIAGNOSIS — E119 Type 2 diabetes mellitus without complications: Secondary | ICD-10-CM | POA: Diagnosis not present

## 2015-01-29 DIAGNOSIS — Z9181 History of falling: Secondary | ICD-10-CM | POA: Diagnosis not present

## 2015-01-29 DIAGNOSIS — I10 Essential (primary) hypertension: Secondary | ICD-10-CM | POA: Diagnosis not present

## 2015-02-03 DIAGNOSIS — Z7901 Long term (current) use of anticoagulants: Secondary | ICD-10-CM | POA: Diagnosis not present

## 2015-02-03 DIAGNOSIS — Z9181 History of falling: Secondary | ICD-10-CM | POA: Diagnosis not present

## 2015-02-03 DIAGNOSIS — I10 Essential (primary) hypertension: Secondary | ICD-10-CM | POA: Diagnosis not present

## 2015-02-03 DIAGNOSIS — R2689 Other abnormalities of gait and mobility: Secondary | ICD-10-CM | POA: Diagnosis not present

## 2015-02-03 DIAGNOSIS — E119 Type 2 diabetes mellitus without complications: Secondary | ICD-10-CM | POA: Diagnosis not present

## 2015-02-03 DIAGNOSIS — F0390 Unspecified dementia without behavioral disturbance: Secondary | ICD-10-CM | POA: Diagnosis not present

## 2015-02-05 DIAGNOSIS — E119 Type 2 diabetes mellitus without complications: Secondary | ICD-10-CM | POA: Diagnosis not present

## 2015-02-05 DIAGNOSIS — I10 Essential (primary) hypertension: Secondary | ICD-10-CM | POA: Diagnosis not present

## 2015-02-05 DIAGNOSIS — R2689 Other abnormalities of gait and mobility: Secondary | ICD-10-CM | POA: Diagnosis not present

## 2015-02-05 DIAGNOSIS — F0390 Unspecified dementia without behavioral disturbance: Secondary | ICD-10-CM | POA: Diagnosis not present

## 2015-02-05 DIAGNOSIS — Z9181 History of falling: Secondary | ICD-10-CM | POA: Diagnosis not present

## 2015-02-05 DIAGNOSIS — Z7901 Long term (current) use of anticoagulants: Secondary | ICD-10-CM | POA: Diagnosis not present

## 2015-02-06 DIAGNOSIS — R2689 Other abnormalities of gait and mobility: Secondary | ICD-10-CM | POA: Diagnosis not present

## 2015-02-06 DIAGNOSIS — I10 Essential (primary) hypertension: Secondary | ICD-10-CM | POA: Diagnosis not present

## 2015-02-06 DIAGNOSIS — Z9181 History of falling: Secondary | ICD-10-CM | POA: Diagnosis not present

## 2015-02-06 DIAGNOSIS — Z7901 Long term (current) use of anticoagulants: Secondary | ICD-10-CM | POA: Diagnosis not present

## 2015-02-06 DIAGNOSIS — E119 Type 2 diabetes mellitus without complications: Secondary | ICD-10-CM | POA: Diagnosis not present

## 2015-02-06 DIAGNOSIS — F0390 Unspecified dementia without behavioral disturbance: Secondary | ICD-10-CM | POA: Diagnosis not present

## 2015-02-10 DIAGNOSIS — Z7901 Long term (current) use of anticoagulants: Secondary | ICD-10-CM | POA: Diagnosis not present

## 2015-02-10 DIAGNOSIS — Z9181 History of falling: Secondary | ICD-10-CM | POA: Diagnosis not present

## 2015-02-10 DIAGNOSIS — R2689 Other abnormalities of gait and mobility: Secondary | ICD-10-CM | POA: Diagnosis not present

## 2015-02-10 DIAGNOSIS — F0390 Unspecified dementia without behavioral disturbance: Secondary | ICD-10-CM | POA: Diagnosis not present

## 2015-02-10 DIAGNOSIS — I10 Essential (primary) hypertension: Secondary | ICD-10-CM | POA: Diagnosis not present

## 2015-02-10 DIAGNOSIS — E119 Type 2 diabetes mellitus without complications: Secondary | ICD-10-CM | POA: Diagnosis not present

## 2015-02-11 DIAGNOSIS — F0390 Unspecified dementia without behavioral disturbance: Secondary | ICD-10-CM | POA: Diagnosis not present

## 2015-02-11 DIAGNOSIS — Z9181 History of falling: Secondary | ICD-10-CM | POA: Diagnosis not present

## 2015-02-11 DIAGNOSIS — R2689 Other abnormalities of gait and mobility: Secondary | ICD-10-CM | POA: Diagnosis not present

## 2015-02-11 DIAGNOSIS — E119 Type 2 diabetes mellitus without complications: Secondary | ICD-10-CM | POA: Diagnosis not present

## 2015-02-11 DIAGNOSIS — I10 Essential (primary) hypertension: Secondary | ICD-10-CM | POA: Diagnosis not present

## 2015-02-11 DIAGNOSIS — Z7901 Long term (current) use of anticoagulants: Secondary | ICD-10-CM | POA: Diagnosis not present

## 2015-02-13 DIAGNOSIS — Z7901 Long term (current) use of anticoagulants: Secondary | ICD-10-CM | POA: Diagnosis not present

## 2015-02-13 DIAGNOSIS — R2689 Other abnormalities of gait and mobility: Secondary | ICD-10-CM | POA: Diagnosis not present

## 2015-02-13 DIAGNOSIS — F0390 Unspecified dementia without behavioral disturbance: Secondary | ICD-10-CM | POA: Diagnosis not present

## 2015-02-13 DIAGNOSIS — Z9181 History of falling: Secondary | ICD-10-CM | POA: Diagnosis not present

## 2015-02-13 DIAGNOSIS — E119 Type 2 diabetes mellitus without complications: Secondary | ICD-10-CM | POA: Diagnosis not present

## 2015-02-13 DIAGNOSIS — I10 Essential (primary) hypertension: Secondary | ICD-10-CM | POA: Diagnosis not present

## 2015-02-18 DIAGNOSIS — F0390 Unspecified dementia without behavioral disturbance: Secondary | ICD-10-CM | POA: Diagnosis not present

## 2015-02-18 DIAGNOSIS — I10 Essential (primary) hypertension: Secondary | ICD-10-CM | POA: Diagnosis not present

## 2015-02-18 DIAGNOSIS — R2689 Other abnormalities of gait and mobility: Secondary | ICD-10-CM | POA: Diagnosis not present

## 2015-02-18 DIAGNOSIS — Z9181 History of falling: Secondary | ICD-10-CM | POA: Diagnosis not present

## 2015-02-18 DIAGNOSIS — Z7901 Long term (current) use of anticoagulants: Secondary | ICD-10-CM | POA: Diagnosis not present

## 2015-02-18 DIAGNOSIS — E119 Type 2 diabetes mellitus without complications: Secondary | ICD-10-CM | POA: Diagnosis not present

## 2015-02-19 DIAGNOSIS — E119 Type 2 diabetes mellitus without complications: Secondary | ICD-10-CM | POA: Diagnosis not present

## 2015-02-19 DIAGNOSIS — F0390 Unspecified dementia without behavioral disturbance: Secondary | ICD-10-CM | POA: Diagnosis not present

## 2015-02-19 DIAGNOSIS — Z9181 History of falling: Secondary | ICD-10-CM | POA: Diagnosis not present

## 2015-02-19 DIAGNOSIS — R2689 Other abnormalities of gait and mobility: Secondary | ICD-10-CM | POA: Diagnosis not present

## 2015-02-19 DIAGNOSIS — I10 Essential (primary) hypertension: Secondary | ICD-10-CM | POA: Diagnosis not present

## 2015-02-19 DIAGNOSIS — Z7901 Long term (current) use of anticoagulants: Secondary | ICD-10-CM | POA: Diagnosis not present

## 2015-02-21 DIAGNOSIS — I10 Essential (primary) hypertension: Secondary | ICD-10-CM | POA: Diagnosis not present

## 2015-02-21 DIAGNOSIS — Z9181 History of falling: Secondary | ICD-10-CM | POA: Diagnosis not present

## 2015-02-21 DIAGNOSIS — F0390 Unspecified dementia without behavioral disturbance: Secondary | ICD-10-CM | POA: Diagnosis not present

## 2015-02-21 DIAGNOSIS — R2689 Other abnormalities of gait and mobility: Secondary | ICD-10-CM | POA: Diagnosis not present

## 2015-02-21 DIAGNOSIS — E119 Type 2 diabetes mellitus without complications: Secondary | ICD-10-CM | POA: Diagnosis not present

## 2015-02-21 DIAGNOSIS — Z7901 Long term (current) use of anticoagulants: Secondary | ICD-10-CM | POA: Diagnosis not present

## 2015-02-25 DIAGNOSIS — G20 Parkinson's disease: Secondary | ICD-10-CM | POA: Diagnosis not present

## 2015-02-27 DIAGNOSIS — I10 Essential (primary) hypertension: Secondary | ICD-10-CM | POA: Diagnosis not present

## 2015-02-27 DIAGNOSIS — E119 Type 2 diabetes mellitus without complications: Secondary | ICD-10-CM | POA: Diagnosis not present

## 2015-02-27 DIAGNOSIS — R2689 Other abnormalities of gait and mobility: Secondary | ICD-10-CM | POA: Diagnosis not present

## 2015-02-27 DIAGNOSIS — Z7901 Long term (current) use of anticoagulants: Secondary | ICD-10-CM | POA: Diagnosis not present

## 2015-02-27 DIAGNOSIS — Z9181 History of falling: Secondary | ICD-10-CM | POA: Diagnosis not present

## 2015-02-27 DIAGNOSIS — F0390 Unspecified dementia without behavioral disturbance: Secondary | ICD-10-CM | POA: Diagnosis not present

## 2015-02-28 DIAGNOSIS — R2689 Other abnormalities of gait and mobility: Secondary | ICD-10-CM | POA: Diagnosis not present

## 2015-02-28 DIAGNOSIS — Z7901 Long term (current) use of anticoagulants: Secondary | ICD-10-CM | POA: Diagnosis not present

## 2015-02-28 DIAGNOSIS — F0390 Unspecified dementia without behavioral disturbance: Secondary | ICD-10-CM | POA: Diagnosis not present

## 2015-02-28 DIAGNOSIS — Z9181 History of falling: Secondary | ICD-10-CM | POA: Diagnosis not present

## 2015-02-28 DIAGNOSIS — I10 Essential (primary) hypertension: Secondary | ICD-10-CM | POA: Diagnosis not present

## 2015-02-28 DIAGNOSIS — E119 Type 2 diabetes mellitus without complications: Secondary | ICD-10-CM | POA: Diagnosis not present

## 2015-03-04 DIAGNOSIS — L89152 Pressure ulcer of sacral region, stage 2: Secondary | ICD-10-CM | POA: Diagnosis not present

## 2015-03-04 DIAGNOSIS — E119 Type 2 diabetes mellitus without complications: Secondary | ICD-10-CM | POA: Diagnosis not present

## 2015-03-04 DIAGNOSIS — M6281 Muscle weakness (generalized): Secondary | ICD-10-CM | POA: Diagnosis not present

## 2015-03-04 DIAGNOSIS — R2689 Other abnormalities of gait and mobility: Secondary | ICD-10-CM | POA: Diagnosis not present

## 2015-03-04 DIAGNOSIS — I10 Essential (primary) hypertension: Secondary | ICD-10-CM | POA: Diagnosis not present

## 2015-03-04 DIAGNOSIS — Z7901 Long term (current) use of anticoagulants: Secondary | ICD-10-CM | POA: Diagnosis not present

## 2015-03-04 DIAGNOSIS — Z9181 History of falling: Secondary | ICD-10-CM | POA: Diagnosis not present

## 2015-03-04 DIAGNOSIS — F0390 Unspecified dementia without behavioral disturbance: Secondary | ICD-10-CM | POA: Diagnosis not present

## 2015-03-05 DIAGNOSIS — L89152 Pressure ulcer of sacral region, stage 2: Secondary | ICD-10-CM | POA: Diagnosis not present

## 2015-03-05 DIAGNOSIS — F0390 Unspecified dementia without behavioral disturbance: Secondary | ICD-10-CM | POA: Diagnosis not present

## 2015-03-05 DIAGNOSIS — I10 Essential (primary) hypertension: Secondary | ICD-10-CM | POA: Diagnosis not present

## 2015-03-05 DIAGNOSIS — R2689 Other abnormalities of gait and mobility: Secondary | ICD-10-CM | POA: Diagnosis not present

## 2015-03-05 DIAGNOSIS — M6281 Muscle weakness (generalized): Secondary | ICD-10-CM | POA: Diagnosis not present

## 2015-03-05 DIAGNOSIS — E119 Type 2 diabetes mellitus without complications: Secondary | ICD-10-CM | POA: Diagnosis not present

## 2015-03-07 DIAGNOSIS — F0390 Unspecified dementia without behavioral disturbance: Secondary | ICD-10-CM | POA: Diagnosis not present

## 2015-03-07 DIAGNOSIS — E119 Type 2 diabetes mellitus without complications: Secondary | ICD-10-CM | POA: Diagnosis not present

## 2015-03-07 DIAGNOSIS — M6281 Muscle weakness (generalized): Secondary | ICD-10-CM | POA: Diagnosis not present

## 2015-03-07 DIAGNOSIS — I10 Essential (primary) hypertension: Secondary | ICD-10-CM | POA: Diagnosis not present

## 2015-03-07 DIAGNOSIS — L89152 Pressure ulcer of sacral region, stage 2: Secondary | ICD-10-CM | POA: Diagnosis not present

## 2015-03-07 DIAGNOSIS — R2689 Other abnormalities of gait and mobility: Secondary | ICD-10-CM | POA: Diagnosis not present

## 2015-03-10 DIAGNOSIS — M6281 Muscle weakness (generalized): Secondary | ICD-10-CM | POA: Diagnosis not present

## 2015-03-10 DIAGNOSIS — R2689 Other abnormalities of gait and mobility: Secondary | ICD-10-CM | POA: Diagnosis not present

## 2015-03-10 DIAGNOSIS — L89152 Pressure ulcer of sacral region, stage 2: Secondary | ICD-10-CM | POA: Diagnosis not present

## 2015-03-10 DIAGNOSIS — F0390 Unspecified dementia without behavioral disturbance: Secondary | ICD-10-CM | POA: Diagnosis not present

## 2015-03-10 DIAGNOSIS — I10 Essential (primary) hypertension: Secondary | ICD-10-CM | POA: Diagnosis not present

## 2015-03-10 DIAGNOSIS — E119 Type 2 diabetes mellitus without complications: Secondary | ICD-10-CM | POA: Diagnosis not present

## 2015-03-12 DIAGNOSIS — R2689 Other abnormalities of gait and mobility: Secondary | ICD-10-CM | POA: Diagnosis not present

## 2015-03-12 DIAGNOSIS — E119 Type 2 diabetes mellitus without complications: Secondary | ICD-10-CM | POA: Diagnosis not present

## 2015-03-12 DIAGNOSIS — M6281 Muscle weakness (generalized): Secondary | ICD-10-CM | POA: Diagnosis not present

## 2015-03-12 DIAGNOSIS — F0390 Unspecified dementia without behavioral disturbance: Secondary | ICD-10-CM | POA: Diagnosis not present

## 2015-03-12 DIAGNOSIS — I10 Essential (primary) hypertension: Secondary | ICD-10-CM | POA: Diagnosis not present

## 2015-03-12 DIAGNOSIS — L89152 Pressure ulcer of sacral region, stage 2: Secondary | ICD-10-CM | POA: Diagnosis not present

## 2015-03-17 DIAGNOSIS — E119 Type 2 diabetes mellitus without complications: Secondary | ICD-10-CM | POA: Diagnosis not present

## 2015-03-17 DIAGNOSIS — I10 Essential (primary) hypertension: Secondary | ICD-10-CM | POA: Diagnosis not present

## 2015-03-17 DIAGNOSIS — L89152 Pressure ulcer of sacral region, stage 2: Secondary | ICD-10-CM | POA: Diagnosis not present

## 2015-03-17 DIAGNOSIS — R2689 Other abnormalities of gait and mobility: Secondary | ICD-10-CM | POA: Diagnosis not present

## 2015-03-17 DIAGNOSIS — M6281 Muscle weakness (generalized): Secondary | ICD-10-CM | POA: Diagnosis not present

## 2015-03-17 DIAGNOSIS — F0390 Unspecified dementia without behavioral disturbance: Secondary | ICD-10-CM | POA: Diagnosis not present

## 2015-03-19 DIAGNOSIS — E119 Type 2 diabetes mellitus without complications: Secondary | ICD-10-CM | POA: Diagnosis not present

## 2015-03-19 DIAGNOSIS — M6281 Muscle weakness (generalized): Secondary | ICD-10-CM | POA: Diagnosis not present

## 2015-03-19 DIAGNOSIS — R2689 Other abnormalities of gait and mobility: Secondary | ICD-10-CM | POA: Diagnosis not present

## 2015-03-19 DIAGNOSIS — L89152 Pressure ulcer of sacral region, stage 2: Secondary | ICD-10-CM | POA: Diagnosis not present

## 2015-03-19 DIAGNOSIS — I10 Essential (primary) hypertension: Secondary | ICD-10-CM | POA: Diagnosis not present

## 2015-03-19 DIAGNOSIS — F0390 Unspecified dementia without behavioral disturbance: Secondary | ICD-10-CM | POA: Diagnosis not present

## 2015-03-20 DIAGNOSIS — L89152 Pressure ulcer of sacral region, stage 2: Secondary | ICD-10-CM | POA: Diagnosis not present

## 2015-03-20 DIAGNOSIS — I10 Essential (primary) hypertension: Secondary | ICD-10-CM | POA: Diagnosis not present

## 2015-03-20 DIAGNOSIS — E119 Type 2 diabetes mellitus without complications: Secondary | ICD-10-CM | POA: Diagnosis not present

## 2015-03-20 DIAGNOSIS — F0390 Unspecified dementia without behavioral disturbance: Secondary | ICD-10-CM | POA: Diagnosis not present

## 2015-03-20 DIAGNOSIS — M6281 Muscle weakness (generalized): Secondary | ICD-10-CM | POA: Diagnosis not present

## 2015-03-20 DIAGNOSIS — R2689 Other abnormalities of gait and mobility: Secondary | ICD-10-CM | POA: Diagnosis not present

## 2015-03-26 DIAGNOSIS — E119 Type 2 diabetes mellitus without complications: Secondary | ICD-10-CM | POA: Diagnosis not present

## 2015-03-26 DIAGNOSIS — M6281 Muscle weakness (generalized): Secondary | ICD-10-CM | POA: Diagnosis not present

## 2015-03-26 DIAGNOSIS — F0390 Unspecified dementia without behavioral disturbance: Secondary | ICD-10-CM | POA: Diagnosis not present

## 2015-03-26 DIAGNOSIS — L89152 Pressure ulcer of sacral region, stage 2: Secondary | ICD-10-CM | POA: Diagnosis not present

## 2015-03-26 DIAGNOSIS — I10 Essential (primary) hypertension: Secondary | ICD-10-CM | POA: Diagnosis not present

## 2015-03-26 DIAGNOSIS — R2689 Other abnormalities of gait and mobility: Secondary | ICD-10-CM | POA: Diagnosis not present

## 2015-03-31 DIAGNOSIS — F0390 Unspecified dementia without behavioral disturbance: Secondary | ICD-10-CM | POA: Diagnosis not present

## 2015-03-31 DIAGNOSIS — R2689 Other abnormalities of gait and mobility: Secondary | ICD-10-CM | POA: Diagnosis not present

## 2015-03-31 DIAGNOSIS — M6281 Muscle weakness (generalized): Secondary | ICD-10-CM | POA: Diagnosis not present

## 2015-03-31 DIAGNOSIS — I10 Essential (primary) hypertension: Secondary | ICD-10-CM | POA: Diagnosis not present

## 2015-03-31 DIAGNOSIS — L89152 Pressure ulcer of sacral region, stage 2: Secondary | ICD-10-CM | POA: Diagnosis not present

## 2015-03-31 DIAGNOSIS — E119 Type 2 diabetes mellitus without complications: Secondary | ICD-10-CM | POA: Diagnosis not present

## 2015-04-09 DIAGNOSIS — I10 Essential (primary) hypertension: Secondary | ICD-10-CM | POA: Diagnosis not present

## 2015-04-09 DIAGNOSIS — M6281 Muscle weakness (generalized): Secondary | ICD-10-CM | POA: Diagnosis not present

## 2015-04-09 DIAGNOSIS — R2689 Other abnormalities of gait and mobility: Secondary | ICD-10-CM | POA: Diagnosis not present

## 2015-04-09 DIAGNOSIS — L89152 Pressure ulcer of sacral region, stage 2: Secondary | ICD-10-CM | POA: Diagnosis not present

## 2015-04-09 DIAGNOSIS — F0390 Unspecified dementia without behavioral disturbance: Secondary | ICD-10-CM | POA: Diagnosis not present

## 2015-04-09 DIAGNOSIS — E119 Type 2 diabetes mellitus without complications: Secondary | ICD-10-CM | POA: Diagnosis not present

## 2015-04-16 DIAGNOSIS — F0390 Unspecified dementia without behavioral disturbance: Secondary | ICD-10-CM | POA: Diagnosis not present

## 2015-04-16 DIAGNOSIS — M6281 Muscle weakness (generalized): Secondary | ICD-10-CM | POA: Diagnosis not present

## 2015-04-16 DIAGNOSIS — E119 Type 2 diabetes mellitus without complications: Secondary | ICD-10-CM | POA: Diagnosis not present

## 2015-04-16 DIAGNOSIS — I10 Essential (primary) hypertension: Secondary | ICD-10-CM | POA: Diagnosis not present

## 2015-04-16 DIAGNOSIS — L89152 Pressure ulcer of sacral region, stage 2: Secondary | ICD-10-CM | POA: Diagnosis not present

## 2015-04-16 DIAGNOSIS — R2689 Other abnormalities of gait and mobility: Secondary | ICD-10-CM | POA: Diagnosis not present

## 2015-04-22 DIAGNOSIS — I10 Essential (primary) hypertension: Secondary | ICD-10-CM | POA: Diagnosis not present

## 2015-04-22 DIAGNOSIS — E119 Type 2 diabetes mellitus without complications: Secondary | ICD-10-CM | POA: Diagnosis not present

## 2015-04-22 DIAGNOSIS — R2689 Other abnormalities of gait and mobility: Secondary | ICD-10-CM | POA: Diagnosis not present

## 2015-04-22 DIAGNOSIS — F0390 Unspecified dementia without behavioral disturbance: Secondary | ICD-10-CM | POA: Diagnosis not present

## 2015-04-22 DIAGNOSIS — M6281 Muscle weakness (generalized): Secondary | ICD-10-CM | POA: Diagnosis not present

## 2015-04-22 DIAGNOSIS — L89152 Pressure ulcer of sacral region, stage 2: Secondary | ICD-10-CM | POA: Diagnosis not present

## 2015-04-28 DIAGNOSIS — I10 Essential (primary) hypertension: Secondary | ICD-10-CM | POA: Diagnosis not present

## 2015-04-28 DIAGNOSIS — G20 Parkinson's disease: Secondary | ICD-10-CM | POA: Diagnosis not present

## 2015-04-28 DIAGNOSIS — E119 Type 2 diabetes mellitus without complications: Secondary | ICD-10-CM | POA: Diagnosis not present

## 2015-04-28 DIAGNOSIS — E039 Hypothyroidism, unspecified: Secondary | ICD-10-CM | POA: Diagnosis not present

## 2015-04-28 DIAGNOSIS — J309 Allergic rhinitis, unspecified: Secondary | ICD-10-CM | POA: Diagnosis not present

## 2015-04-28 DIAGNOSIS — J019 Acute sinusitis, unspecified: Secondary | ICD-10-CM | POA: Diagnosis not present

## 2015-04-29 DIAGNOSIS — L89152 Pressure ulcer of sacral region, stage 2: Secondary | ICD-10-CM | POA: Diagnosis not present

## 2015-04-29 DIAGNOSIS — I10 Essential (primary) hypertension: Secondary | ICD-10-CM | POA: Diagnosis not present

## 2015-04-29 DIAGNOSIS — R2689 Other abnormalities of gait and mobility: Secondary | ICD-10-CM | POA: Diagnosis not present

## 2015-04-29 DIAGNOSIS — F0390 Unspecified dementia without behavioral disturbance: Secondary | ICD-10-CM | POA: Diagnosis not present

## 2015-04-29 DIAGNOSIS — M6281 Muscle weakness (generalized): Secondary | ICD-10-CM | POA: Diagnosis not present

## 2015-04-29 DIAGNOSIS — E119 Type 2 diabetes mellitus without complications: Secondary | ICD-10-CM | POA: Diagnosis not present

## 2015-05-28 DIAGNOSIS — G20 Parkinson's disease: Secondary | ICD-10-CM | POA: Diagnosis not present

## 2015-07-07 DIAGNOSIS — I10 Essential (primary) hypertension: Secondary | ICD-10-CM | POA: Diagnosis not present

## 2015-07-07 DIAGNOSIS — N390 Urinary tract infection, site not specified: Secondary | ICD-10-CM | POA: Diagnosis not present

## 2015-07-07 DIAGNOSIS — E048 Other specified nontoxic goiter: Secondary | ICD-10-CM | POA: Diagnosis not present

## 2015-07-07 DIAGNOSIS — F331 Major depressive disorder, recurrent, moderate: Secondary | ICD-10-CM | POA: Diagnosis not present

## 2015-07-07 DIAGNOSIS — J309 Allergic rhinitis, unspecified: Secondary | ICD-10-CM | POA: Diagnosis not present

## 2015-07-08 DIAGNOSIS — Z9181 History of falling: Secondary | ICD-10-CM | POA: Diagnosis not present

## 2015-07-08 DIAGNOSIS — I10 Essential (primary) hypertension: Secondary | ICD-10-CM | POA: Diagnosis not present

## 2015-07-08 DIAGNOSIS — R262 Difficulty in walking, not elsewhere classified: Secondary | ICD-10-CM | POA: Diagnosis not present

## 2015-07-08 DIAGNOSIS — F0390 Unspecified dementia without behavioral disturbance: Secondary | ICD-10-CM | POA: Diagnosis not present

## 2015-07-08 DIAGNOSIS — E119 Type 2 diabetes mellitus without complications: Secondary | ICD-10-CM | POA: Diagnosis not present

## 2015-07-08 DIAGNOSIS — M6281 Muscle weakness (generalized): Secondary | ICD-10-CM | POA: Diagnosis not present

## 2015-07-14 DIAGNOSIS — F0390 Unspecified dementia without behavioral disturbance: Secondary | ICD-10-CM | POA: Diagnosis not present

## 2015-07-14 DIAGNOSIS — M6281 Muscle weakness (generalized): Secondary | ICD-10-CM | POA: Diagnosis not present

## 2015-07-14 DIAGNOSIS — R262 Difficulty in walking, not elsewhere classified: Secondary | ICD-10-CM | POA: Diagnosis not present

## 2015-07-14 DIAGNOSIS — E119 Type 2 diabetes mellitus without complications: Secondary | ICD-10-CM | POA: Diagnosis not present

## 2015-07-14 DIAGNOSIS — I10 Essential (primary) hypertension: Secondary | ICD-10-CM | POA: Diagnosis not present

## 2015-07-14 DIAGNOSIS — Z9181 History of falling: Secondary | ICD-10-CM | POA: Diagnosis not present

## 2015-07-16 DIAGNOSIS — F0390 Unspecified dementia without behavioral disturbance: Secondary | ICD-10-CM | POA: Diagnosis not present

## 2015-07-16 DIAGNOSIS — Z9181 History of falling: Secondary | ICD-10-CM | POA: Diagnosis not present

## 2015-07-16 DIAGNOSIS — I10 Essential (primary) hypertension: Secondary | ICD-10-CM | POA: Diagnosis not present

## 2015-07-16 DIAGNOSIS — R262 Difficulty in walking, not elsewhere classified: Secondary | ICD-10-CM | POA: Diagnosis not present

## 2015-07-16 DIAGNOSIS — M6281 Muscle weakness (generalized): Secondary | ICD-10-CM | POA: Diagnosis not present

## 2015-07-16 DIAGNOSIS — E119 Type 2 diabetes mellitus without complications: Secondary | ICD-10-CM | POA: Diagnosis not present

## 2015-07-22 DIAGNOSIS — R262 Difficulty in walking, not elsewhere classified: Secondary | ICD-10-CM | POA: Diagnosis not present

## 2015-07-22 DIAGNOSIS — M6281 Muscle weakness (generalized): Secondary | ICD-10-CM | POA: Diagnosis not present

## 2015-07-22 DIAGNOSIS — E119 Type 2 diabetes mellitus without complications: Secondary | ICD-10-CM | POA: Diagnosis not present

## 2015-07-22 DIAGNOSIS — F0390 Unspecified dementia without behavioral disturbance: Secondary | ICD-10-CM | POA: Diagnosis not present

## 2015-07-22 DIAGNOSIS — I10 Essential (primary) hypertension: Secondary | ICD-10-CM | POA: Diagnosis not present

## 2015-07-22 DIAGNOSIS — Z9181 History of falling: Secondary | ICD-10-CM | POA: Diagnosis not present

## 2015-07-23 DIAGNOSIS — E039 Hypothyroidism, unspecified: Secondary | ICD-10-CM | POA: Diagnosis not present

## 2015-07-24 DIAGNOSIS — E119 Type 2 diabetes mellitus without complications: Secondary | ICD-10-CM | POA: Diagnosis not present

## 2015-07-24 DIAGNOSIS — I10 Essential (primary) hypertension: Secondary | ICD-10-CM | POA: Diagnosis not present

## 2015-07-24 DIAGNOSIS — Z9181 History of falling: Secondary | ICD-10-CM | POA: Diagnosis not present

## 2015-07-24 DIAGNOSIS — M6281 Muscle weakness (generalized): Secondary | ICD-10-CM | POA: Diagnosis not present

## 2015-07-24 DIAGNOSIS — R262 Difficulty in walking, not elsewhere classified: Secondary | ICD-10-CM | POA: Diagnosis not present

## 2015-07-24 DIAGNOSIS — F0390 Unspecified dementia without behavioral disturbance: Secondary | ICD-10-CM | POA: Diagnosis not present

## 2015-07-29 DIAGNOSIS — Z9181 History of falling: Secondary | ICD-10-CM | POA: Diagnosis not present

## 2015-07-29 DIAGNOSIS — I10 Essential (primary) hypertension: Secondary | ICD-10-CM | POA: Diagnosis not present

## 2015-07-29 DIAGNOSIS — E119 Type 2 diabetes mellitus without complications: Secondary | ICD-10-CM | POA: Diagnosis not present

## 2015-07-29 DIAGNOSIS — F0390 Unspecified dementia without behavioral disturbance: Secondary | ICD-10-CM | POA: Diagnosis not present

## 2015-07-29 DIAGNOSIS — M6281 Muscle weakness (generalized): Secondary | ICD-10-CM | POA: Diagnosis not present

## 2015-07-29 DIAGNOSIS — R262 Difficulty in walking, not elsewhere classified: Secondary | ICD-10-CM | POA: Diagnosis not present

## 2015-07-30 DIAGNOSIS — M6281 Muscle weakness (generalized): Secondary | ICD-10-CM | POA: Diagnosis not present

## 2015-07-30 DIAGNOSIS — F0390 Unspecified dementia without behavioral disturbance: Secondary | ICD-10-CM | POA: Diagnosis not present

## 2015-07-30 DIAGNOSIS — R262 Difficulty in walking, not elsewhere classified: Secondary | ICD-10-CM | POA: Diagnosis not present

## 2015-07-30 DIAGNOSIS — I10 Essential (primary) hypertension: Secondary | ICD-10-CM | POA: Diagnosis not present

## 2015-07-30 DIAGNOSIS — E119 Type 2 diabetes mellitus without complications: Secondary | ICD-10-CM | POA: Diagnosis not present

## 2015-07-30 DIAGNOSIS — Z9181 History of falling: Secondary | ICD-10-CM | POA: Diagnosis not present

## 2015-08-04 DIAGNOSIS — M25511 Pain in right shoulder: Secondary | ICD-10-CM | POA: Diagnosis not present

## 2015-08-04 DIAGNOSIS — M25551 Pain in right hip: Secondary | ICD-10-CM | POA: Diagnosis not present

## 2015-08-04 DIAGNOSIS — R262 Difficulty in walking, not elsewhere classified: Secondary | ICD-10-CM | POA: Diagnosis not present

## 2015-08-04 DIAGNOSIS — M6281 Muscle weakness (generalized): Secondary | ICD-10-CM | POA: Diagnosis not present

## 2015-08-04 DIAGNOSIS — G20 Parkinson's disease: Secondary | ICD-10-CM | POA: Diagnosis not present

## 2015-08-04 DIAGNOSIS — E119 Type 2 diabetes mellitus without complications: Secondary | ICD-10-CM | POA: Diagnosis not present

## 2015-08-04 DIAGNOSIS — Z9181 History of falling: Secondary | ICD-10-CM | POA: Diagnosis not present

## 2015-08-04 DIAGNOSIS — E048 Other specified nontoxic goiter: Secondary | ICD-10-CM | POA: Diagnosis not present

## 2015-08-04 DIAGNOSIS — I10 Essential (primary) hypertension: Secondary | ICD-10-CM | POA: Diagnosis not present

## 2015-08-04 DIAGNOSIS — W1830XA Fall on same level, unspecified, initial encounter: Secondary | ICD-10-CM | POA: Diagnosis not present

## 2015-08-04 DIAGNOSIS — F0390 Unspecified dementia without behavioral disturbance: Secondary | ICD-10-CM | POA: Diagnosis not present

## 2015-08-06 DIAGNOSIS — Z9181 History of falling: Secondary | ICD-10-CM | POA: Diagnosis not present

## 2015-08-06 DIAGNOSIS — R262 Difficulty in walking, not elsewhere classified: Secondary | ICD-10-CM | POA: Diagnosis not present

## 2015-08-06 DIAGNOSIS — F0390 Unspecified dementia without behavioral disturbance: Secondary | ICD-10-CM | POA: Diagnosis not present

## 2015-08-06 DIAGNOSIS — M6281 Muscle weakness (generalized): Secondary | ICD-10-CM | POA: Diagnosis not present

## 2015-08-06 DIAGNOSIS — I10 Essential (primary) hypertension: Secondary | ICD-10-CM | POA: Diagnosis not present

## 2015-08-06 DIAGNOSIS — E119 Type 2 diabetes mellitus without complications: Secondary | ICD-10-CM | POA: Diagnosis not present

## 2015-08-07 ENCOUNTER — Ambulatory Visit
Admission: RE | Admit: 2015-08-07 | Discharge: 2015-08-07 | Disposition: A | Discharge status: Home / Self Care | Payer: Medicare Other | Source: Ambulatory Visit | Attending: Nurse Practitioner | Admitting: Nurse Practitioner

## 2015-08-07 ENCOUNTER — Other Ambulatory Visit: Payer: Self-pay | Admitting: Nurse Practitioner

## 2015-08-07 DIAGNOSIS — M19011 Primary osteoarthritis, right shoulder: Secondary | ICD-10-CM | POA: Diagnosis not present

## 2015-08-07 DIAGNOSIS — M160 Bilateral primary osteoarthritis of hip: Secondary | ICD-10-CM | POA: Diagnosis not present

## 2015-08-07 DIAGNOSIS — M25511 Pain in right shoulder: Secondary | ICD-10-CM

## 2015-08-07 DIAGNOSIS — M25551 Pain in right hip: Secondary | ICD-10-CM | POA: Diagnosis not present

## 2015-08-12 DIAGNOSIS — R262 Difficulty in walking, not elsewhere classified: Secondary | ICD-10-CM | POA: Diagnosis not present

## 2015-08-12 DIAGNOSIS — M6281 Muscle weakness (generalized): Secondary | ICD-10-CM | POA: Diagnosis not present

## 2015-08-12 DIAGNOSIS — E119 Type 2 diabetes mellitus without complications: Secondary | ICD-10-CM | POA: Diagnosis not present

## 2015-08-12 DIAGNOSIS — F0390 Unspecified dementia without behavioral disturbance: Secondary | ICD-10-CM | POA: Diagnosis not present

## 2015-08-12 DIAGNOSIS — I10 Essential (primary) hypertension: Secondary | ICD-10-CM | POA: Diagnosis not present

## 2015-08-12 DIAGNOSIS — Z9181 History of falling: Secondary | ICD-10-CM | POA: Diagnosis not present

## 2015-08-14 DIAGNOSIS — R262 Difficulty in walking, not elsewhere classified: Secondary | ICD-10-CM | POA: Diagnosis not present

## 2015-08-14 DIAGNOSIS — Z9181 History of falling: Secondary | ICD-10-CM | POA: Diagnosis not present

## 2015-08-14 DIAGNOSIS — M6281 Muscle weakness (generalized): Secondary | ICD-10-CM | POA: Diagnosis not present

## 2015-08-14 DIAGNOSIS — E119 Type 2 diabetes mellitus without complications: Secondary | ICD-10-CM | POA: Diagnosis not present

## 2015-08-14 DIAGNOSIS — I10 Essential (primary) hypertension: Secondary | ICD-10-CM | POA: Diagnosis not present

## 2015-08-14 DIAGNOSIS — F0390 Unspecified dementia without behavioral disturbance: Secondary | ICD-10-CM | POA: Diagnosis not present

## 2015-08-21 DIAGNOSIS — I10 Essential (primary) hypertension: Secondary | ICD-10-CM | POA: Diagnosis not present

## 2015-08-21 DIAGNOSIS — F0390 Unspecified dementia without behavioral disturbance: Secondary | ICD-10-CM | POA: Diagnosis not present

## 2015-08-21 DIAGNOSIS — E119 Type 2 diabetes mellitus without complications: Secondary | ICD-10-CM | POA: Diagnosis not present

## 2015-08-21 DIAGNOSIS — R262 Difficulty in walking, not elsewhere classified: Secondary | ICD-10-CM | POA: Diagnosis not present

## 2015-08-21 DIAGNOSIS — M6281 Muscle weakness (generalized): Secondary | ICD-10-CM | POA: Diagnosis not present

## 2015-08-21 DIAGNOSIS — Z9181 History of falling: Secondary | ICD-10-CM | POA: Diagnosis not present

## 2015-08-25 DIAGNOSIS — R2681 Unsteadiness on feet: Secondary | ICD-10-CM | POA: Diagnosis not present

## 2015-08-25 DIAGNOSIS — L600 Ingrowing nail: Secondary | ICD-10-CM | POA: Diagnosis not present

## 2015-08-25 DIAGNOSIS — B351 Tinea unguium: Secondary | ICD-10-CM | POA: Diagnosis not present

## 2015-08-25 DIAGNOSIS — M79673 Pain in unspecified foot: Secondary | ICD-10-CM | POA: Diagnosis not present

## 2015-08-26 DIAGNOSIS — F0390 Unspecified dementia without behavioral disturbance: Secondary | ICD-10-CM | POA: Diagnosis not present

## 2015-08-26 DIAGNOSIS — Z9181 History of falling: Secondary | ICD-10-CM | POA: Diagnosis not present

## 2015-08-26 DIAGNOSIS — R262 Difficulty in walking, not elsewhere classified: Secondary | ICD-10-CM | POA: Diagnosis not present

## 2015-08-26 DIAGNOSIS — I10 Essential (primary) hypertension: Secondary | ICD-10-CM | POA: Diagnosis not present

## 2015-08-26 DIAGNOSIS — M6281 Muscle weakness (generalized): Secondary | ICD-10-CM | POA: Diagnosis not present

## 2015-08-26 DIAGNOSIS — E119 Type 2 diabetes mellitus without complications: Secondary | ICD-10-CM | POA: Diagnosis not present

## 2015-08-28 DIAGNOSIS — M6281 Muscle weakness (generalized): Secondary | ICD-10-CM | POA: Diagnosis not present

## 2015-08-28 DIAGNOSIS — Z9181 History of falling: Secondary | ICD-10-CM | POA: Diagnosis not present

## 2015-08-28 DIAGNOSIS — R262 Difficulty in walking, not elsewhere classified: Secondary | ICD-10-CM | POA: Diagnosis not present

## 2015-08-28 DIAGNOSIS — F0390 Unspecified dementia without behavioral disturbance: Secondary | ICD-10-CM | POA: Diagnosis not present

## 2015-08-28 DIAGNOSIS — I10 Essential (primary) hypertension: Secondary | ICD-10-CM | POA: Diagnosis not present

## 2015-08-28 DIAGNOSIS — E119 Type 2 diabetes mellitus without complications: Secondary | ICD-10-CM | POA: Diagnosis not present

## 2015-09-02 DIAGNOSIS — M6281 Muscle weakness (generalized): Secondary | ICD-10-CM | POA: Diagnosis not present

## 2015-09-02 DIAGNOSIS — E119 Type 2 diabetes mellitus without complications: Secondary | ICD-10-CM | POA: Diagnosis not present

## 2015-09-02 DIAGNOSIS — Z9181 History of falling: Secondary | ICD-10-CM | POA: Diagnosis not present

## 2015-09-02 DIAGNOSIS — R262 Difficulty in walking, not elsewhere classified: Secondary | ICD-10-CM | POA: Diagnosis not present

## 2015-09-02 DIAGNOSIS — F0390 Unspecified dementia without behavioral disturbance: Secondary | ICD-10-CM | POA: Diagnosis not present

## 2015-09-02 DIAGNOSIS — I10 Essential (primary) hypertension: Secondary | ICD-10-CM | POA: Diagnosis not present

## 2015-09-04 DIAGNOSIS — I10 Essential (primary) hypertension: Secondary | ICD-10-CM | POA: Diagnosis not present

## 2015-09-04 DIAGNOSIS — F0390 Unspecified dementia without behavioral disturbance: Secondary | ICD-10-CM | POA: Diagnosis not present

## 2015-09-04 DIAGNOSIS — Z9181 History of falling: Secondary | ICD-10-CM | POA: Diagnosis not present

## 2015-09-04 DIAGNOSIS — E119 Type 2 diabetes mellitus without complications: Secondary | ICD-10-CM | POA: Diagnosis not present

## 2015-09-04 DIAGNOSIS — M6281 Muscle weakness (generalized): Secondary | ICD-10-CM | POA: Diagnosis not present

## 2015-09-04 DIAGNOSIS — R262 Difficulty in walking, not elsewhere classified: Secondary | ICD-10-CM | POA: Diagnosis not present

## 2015-09-06 DIAGNOSIS — M6281 Muscle weakness (generalized): Secondary | ICD-10-CM | POA: Diagnosis not present

## 2015-09-06 DIAGNOSIS — R262 Difficulty in walking, not elsewhere classified: Secondary | ICD-10-CM | POA: Diagnosis not present

## 2015-09-06 DIAGNOSIS — F0280 Dementia in other diseases classified elsewhere without behavioral disturbance: Secondary | ICD-10-CM | POA: Diagnosis not present

## 2015-09-06 DIAGNOSIS — E119 Type 2 diabetes mellitus without complications: Secondary | ICD-10-CM | POA: Diagnosis not present

## 2015-09-06 DIAGNOSIS — I10 Essential (primary) hypertension: Secondary | ICD-10-CM | POA: Diagnosis not present

## 2015-09-06 DIAGNOSIS — Z9181 History of falling: Secondary | ICD-10-CM | POA: Diagnosis not present

## 2015-09-06 DIAGNOSIS — G20 Parkinson's disease: Secondary | ICD-10-CM | POA: Diagnosis not present

## 2015-09-09 DIAGNOSIS — G20 Parkinson's disease: Secondary | ICD-10-CM | POA: Diagnosis not present

## 2015-09-09 DIAGNOSIS — F0280 Dementia in other diseases classified elsewhere without behavioral disturbance: Secondary | ICD-10-CM | POA: Diagnosis not present

## 2015-09-09 DIAGNOSIS — E119 Type 2 diabetes mellitus without complications: Secondary | ICD-10-CM | POA: Diagnosis not present

## 2015-09-09 DIAGNOSIS — I10 Essential (primary) hypertension: Secondary | ICD-10-CM | POA: Diagnosis not present

## 2015-09-09 DIAGNOSIS — M6281 Muscle weakness (generalized): Secondary | ICD-10-CM | POA: Diagnosis not present

## 2015-09-09 DIAGNOSIS — R262 Difficulty in walking, not elsewhere classified: Secondary | ICD-10-CM | POA: Diagnosis not present

## 2015-09-17 DIAGNOSIS — F0280 Dementia in other diseases classified elsewhere without behavioral disturbance: Secondary | ICD-10-CM | POA: Diagnosis not present

## 2015-09-17 DIAGNOSIS — G20 Parkinson's disease: Secondary | ICD-10-CM | POA: Diagnosis not present

## 2015-09-17 DIAGNOSIS — M6281 Muscle weakness (generalized): Secondary | ICD-10-CM | POA: Diagnosis not present

## 2015-09-17 DIAGNOSIS — I10 Essential (primary) hypertension: Secondary | ICD-10-CM | POA: Diagnosis not present

## 2015-09-17 DIAGNOSIS — R262 Difficulty in walking, not elsewhere classified: Secondary | ICD-10-CM | POA: Diagnosis not present

## 2015-09-17 DIAGNOSIS — E119 Type 2 diabetes mellitus without complications: Secondary | ICD-10-CM | POA: Diagnosis not present

## 2015-09-19 DIAGNOSIS — I10 Essential (primary) hypertension: Secondary | ICD-10-CM | POA: Diagnosis not present

## 2015-09-19 DIAGNOSIS — F0280 Dementia in other diseases classified elsewhere without behavioral disturbance: Secondary | ICD-10-CM | POA: Diagnosis not present

## 2015-09-19 DIAGNOSIS — M6281 Muscle weakness (generalized): Secondary | ICD-10-CM | POA: Diagnosis not present

## 2015-09-19 DIAGNOSIS — E119 Type 2 diabetes mellitus without complications: Secondary | ICD-10-CM | POA: Diagnosis not present

## 2015-09-19 DIAGNOSIS — R262 Difficulty in walking, not elsewhere classified: Secondary | ICD-10-CM | POA: Diagnosis not present

## 2015-09-19 DIAGNOSIS — G20 Parkinson's disease: Secondary | ICD-10-CM | POA: Diagnosis not present

## 2015-09-22 DIAGNOSIS — E042 Nontoxic multinodular goiter: Secondary | ICD-10-CM | POA: Diagnosis not present

## 2015-09-22 DIAGNOSIS — E892 Postprocedural hypoparathyroidism: Secondary | ICD-10-CM | POA: Diagnosis not present

## 2015-09-23 DIAGNOSIS — G20 Parkinson's disease: Secondary | ICD-10-CM | POA: Diagnosis not present

## 2015-09-23 DIAGNOSIS — F0280 Dementia in other diseases classified elsewhere without behavioral disturbance: Secondary | ICD-10-CM | POA: Diagnosis not present

## 2015-09-23 DIAGNOSIS — M6281 Muscle weakness (generalized): Secondary | ICD-10-CM | POA: Diagnosis not present

## 2015-09-23 DIAGNOSIS — R262 Difficulty in walking, not elsewhere classified: Secondary | ICD-10-CM | POA: Diagnosis not present

## 2015-09-23 DIAGNOSIS — I10 Essential (primary) hypertension: Secondary | ICD-10-CM | POA: Diagnosis not present

## 2015-09-23 DIAGNOSIS — E119 Type 2 diabetes mellitus without complications: Secondary | ICD-10-CM | POA: Diagnosis not present

## 2015-09-26 DIAGNOSIS — G20 Parkinson's disease: Secondary | ICD-10-CM | POA: Diagnosis not present

## 2015-09-26 DIAGNOSIS — F0280 Dementia in other diseases classified elsewhere without behavioral disturbance: Secondary | ICD-10-CM | POA: Diagnosis not present

## 2015-09-26 DIAGNOSIS — I10 Essential (primary) hypertension: Secondary | ICD-10-CM | POA: Diagnosis not present

## 2015-09-26 DIAGNOSIS — E119 Type 2 diabetes mellitus without complications: Secondary | ICD-10-CM | POA: Diagnosis not present

## 2015-09-26 DIAGNOSIS — M6281 Muscle weakness (generalized): Secondary | ICD-10-CM | POA: Diagnosis not present

## 2015-09-26 DIAGNOSIS — R262 Difficulty in walking, not elsewhere classified: Secondary | ICD-10-CM | POA: Diagnosis not present

## 2015-10-02 DIAGNOSIS — I10 Essential (primary) hypertension: Secondary | ICD-10-CM | POA: Diagnosis not present

## 2015-10-02 DIAGNOSIS — F0280 Dementia in other diseases classified elsewhere without behavioral disturbance: Secondary | ICD-10-CM | POA: Diagnosis not present

## 2015-10-02 DIAGNOSIS — E119 Type 2 diabetes mellitus without complications: Secondary | ICD-10-CM | POA: Diagnosis not present

## 2015-10-02 DIAGNOSIS — G20 Parkinson's disease: Secondary | ICD-10-CM | POA: Diagnosis not present

## 2015-10-02 DIAGNOSIS — M6281 Muscle weakness (generalized): Secondary | ICD-10-CM | POA: Diagnosis not present

## 2015-10-02 DIAGNOSIS — R262 Difficulty in walking, not elsewhere classified: Secondary | ICD-10-CM | POA: Diagnosis not present

## 2015-10-08 DIAGNOSIS — E119 Type 2 diabetes mellitus without complications: Secondary | ICD-10-CM | POA: Diagnosis not present

## 2015-10-08 DIAGNOSIS — R262 Difficulty in walking, not elsewhere classified: Secondary | ICD-10-CM | POA: Diagnosis not present

## 2015-10-08 DIAGNOSIS — I10 Essential (primary) hypertension: Secondary | ICD-10-CM | POA: Diagnosis not present

## 2015-10-08 DIAGNOSIS — F0280 Dementia in other diseases classified elsewhere without behavioral disturbance: Secondary | ICD-10-CM | POA: Diagnosis not present

## 2015-10-08 DIAGNOSIS — G20 Parkinson's disease: Secondary | ICD-10-CM | POA: Diagnosis not present

## 2015-10-08 DIAGNOSIS — M6281 Muscle weakness (generalized): Secondary | ICD-10-CM | POA: Diagnosis not present

## 2015-10-09 DIAGNOSIS — R262 Difficulty in walking, not elsewhere classified: Secondary | ICD-10-CM | POA: Diagnosis not present

## 2015-10-09 DIAGNOSIS — M6281 Muscle weakness (generalized): Secondary | ICD-10-CM | POA: Diagnosis not present

## 2015-10-09 DIAGNOSIS — I10 Essential (primary) hypertension: Secondary | ICD-10-CM | POA: Diagnosis not present

## 2015-10-09 DIAGNOSIS — E119 Type 2 diabetes mellitus without complications: Secondary | ICD-10-CM | POA: Diagnosis not present

## 2015-10-09 DIAGNOSIS — G20 Parkinson's disease: Secondary | ICD-10-CM | POA: Diagnosis not present

## 2015-10-09 DIAGNOSIS — F0280 Dementia in other diseases classified elsewhere without behavioral disturbance: Secondary | ICD-10-CM | POA: Diagnosis not present

## 2015-10-14 DIAGNOSIS — G20 Parkinson's disease: Secondary | ICD-10-CM | POA: Diagnosis not present

## 2015-10-14 DIAGNOSIS — M6281 Muscle weakness (generalized): Secondary | ICD-10-CM | POA: Diagnosis not present

## 2015-10-14 DIAGNOSIS — I10 Essential (primary) hypertension: Secondary | ICD-10-CM | POA: Diagnosis not present

## 2015-10-14 DIAGNOSIS — F0280 Dementia in other diseases classified elsewhere without behavioral disturbance: Secondary | ICD-10-CM | POA: Diagnosis not present

## 2015-10-14 DIAGNOSIS — R262 Difficulty in walking, not elsewhere classified: Secondary | ICD-10-CM | POA: Diagnosis not present

## 2015-10-14 DIAGNOSIS — E119 Type 2 diabetes mellitus without complications: Secondary | ICD-10-CM | POA: Diagnosis not present

## 2015-10-17 DIAGNOSIS — E119 Type 2 diabetes mellitus without complications: Secondary | ICD-10-CM | POA: Diagnosis not present

## 2015-10-17 DIAGNOSIS — F0280 Dementia in other diseases classified elsewhere without behavioral disturbance: Secondary | ICD-10-CM | POA: Diagnosis not present

## 2015-10-17 DIAGNOSIS — G20 Parkinson's disease: Secondary | ICD-10-CM | POA: Diagnosis not present

## 2015-10-17 DIAGNOSIS — M6281 Muscle weakness (generalized): Secondary | ICD-10-CM | POA: Diagnosis not present

## 2015-10-17 DIAGNOSIS — R262 Difficulty in walking, not elsewhere classified: Secondary | ICD-10-CM | POA: Diagnosis not present

## 2015-10-17 DIAGNOSIS — I10 Essential (primary) hypertension: Secondary | ICD-10-CM | POA: Diagnosis not present

## 2015-10-18 DIAGNOSIS — H2512 Age-related nuclear cataract, left eye: Secondary | ICD-10-CM | POA: Diagnosis not present

## 2015-10-18 DIAGNOSIS — H04123 Dry eye syndrome of bilateral lacrimal glands: Secondary | ICD-10-CM | POA: Diagnosis not present

## 2015-10-18 DIAGNOSIS — H43813 Vitreous degeneration, bilateral: Secondary | ICD-10-CM | POA: Diagnosis not present

## 2015-10-18 DIAGNOSIS — E119 Type 2 diabetes mellitus without complications: Secondary | ICD-10-CM | POA: Diagnosis not present

## 2015-10-27 DIAGNOSIS — L851 Acquired keratosis [keratoderma] palmaris et plantaris: Secondary | ICD-10-CM | POA: Diagnosis not present

## 2015-10-27 DIAGNOSIS — M79673 Pain in unspecified foot: Secondary | ICD-10-CM | POA: Diagnosis not present

## 2015-10-27 DIAGNOSIS — B070 Plantar wart: Secondary | ICD-10-CM | POA: Diagnosis not present

## 2015-10-27 DIAGNOSIS — B351 Tinea unguium: Secondary | ICD-10-CM | POA: Diagnosis not present

## 2015-11-04 DIAGNOSIS — I10 Essential (primary) hypertension: Secondary | ICD-10-CM | POA: Diagnosis not present

## 2015-11-04 DIAGNOSIS — E119 Type 2 diabetes mellitus without complications: Secondary | ICD-10-CM | POA: Diagnosis not present

## 2015-11-04 DIAGNOSIS — F0280 Dementia in other diseases classified elsewhere without behavioral disturbance: Secondary | ICD-10-CM | POA: Diagnosis not present

## 2015-11-04 DIAGNOSIS — R262 Difficulty in walking, not elsewhere classified: Secondary | ICD-10-CM | POA: Diagnosis not present

## 2015-11-04 DIAGNOSIS — M6281 Muscle weakness (generalized): Secondary | ICD-10-CM | POA: Diagnosis not present

## 2015-11-04 DIAGNOSIS — G20 Parkinson's disease: Secondary | ICD-10-CM | POA: Diagnosis not present

## 2015-11-05 DIAGNOSIS — F0280 Dementia in other diseases classified elsewhere without behavioral disturbance: Secondary | ICD-10-CM | POA: Diagnosis not present

## 2015-11-05 DIAGNOSIS — E119 Type 2 diabetes mellitus without complications: Secondary | ICD-10-CM | POA: Diagnosis not present

## 2015-11-05 DIAGNOSIS — I10 Essential (primary) hypertension: Secondary | ICD-10-CM | POA: Diagnosis not present

## 2015-11-05 DIAGNOSIS — R262 Difficulty in walking, not elsewhere classified: Secondary | ICD-10-CM | POA: Diagnosis not present

## 2015-11-05 DIAGNOSIS — M6281 Muscle weakness (generalized): Secondary | ICD-10-CM | POA: Diagnosis not present

## 2015-11-05 DIAGNOSIS — G20 Parkinson's disease: Secondary | ICD-10-CM | POA: Diagnosis not present

## 2015-11-05 DIAGNOSIS — Z9181 History of falling: Secondary | ICD-10-CM | POA: Diagnosis not present

## 2015-11-10 DIAGNOSIS — M6281 Muscle weakness (generalized): Secondary | ICD-10-CM | POA: Diagnosis not present

## 2015-11-10 DIAGNOSIS — G20 Parkinson's disease: Secondary | ICD-10-CM | POA: Diagnosis not present

## 2015-11-10 DIAGNOSIS — R262 Difficulty in walking, not elsewhere classified: Secondary | ICD-10-CM | POA: Diagnosis not present

## 2015-11-10 DIAGNOSIS — E119 Type 2 diabetes mellitus without complications: Secondary | ICD-10-CM | POA: Diagnosis not present

## 2015-11-10 DIAGNOSIS — I10 Essential (primary) hypertension: Secondary | ICD-10-CM | POA: Diagnosis not present

## 2015-11-10 DIAGNOSIS — F0280 Dementia in other diseases classified elsewhere without behavioral disturbance: Secondary | ICD-10-CM | POA: Diagnosis not present

## 2015-11-12 DIAGNOSIS — G20 Parkinson's disease: Secondary | ICD-10-CM | POA: Diagnosis not present

## 2015-11-12 DIAGNOSIS — M6281 Muscle weakness (generalized): Secondary | ICD-10-CM | POA: Diagnosis not present

## 2015-11-12 DIAGNOSIS — R262 Difficulty in walking, not elsewhere classified: Secondary | ICD-10-CM | POA: Diagnosis not present

## 2015-11-12 DIAGNOSIS — I10 Essential (primary) hypertension: Secondary | ICD-10-CM | POA: Diagnosis not present

## 2015-11-12 DIAGNOSIS — E119 Type 2 diabetes mellitus without complications: Secondary | ICD-10-CM | POA: Diagnosis not present

## 2015-11-12 DIAGNOSIS — F0280 Dementia in other diseases classified elsewhere without behavioral disturbance: Secondary | ICD-10-CM | POA: Diagnosis not present

## 2015-11-17 DIAGNOSIS — M6281 Muscle weakness (generalized): Secondary | ICD-10-CM | POA: Diagnosis not present

## 2015-11-17 DIAGNOSIS — G20 Parkinson's disease: Secondary | ICD-10-CM | POA: Diagnosis not present

## 2015-11-17 DIAGNOSIS — E119 Type 2 diabetes mellitus without complications: Secondary | ICD-10-CM | POA: Diagnosis not present

## 2015-11-17 DIAGNOSIS — R262 Difficulty in walking, not elsewhere classified: Secondary | ICD-10-CM | POA: Diagnosis not present

## 2015-11-17 DIAGNOSIS — F0280 Dementia in other diseases classified elsewhere without behavioral disturbance: Secondary | ICD-10-CM | POA: Diagnosis not present

## 2015-11-17 DIAGNOSIS — I10 Essential (primary) hypertension: Secondary | ICD-10-CM | POA: Diagnosis not present

## 2015-11-19 DIAGNOSIS — F0280 Dementia in other diseases classified elsewhere without behavioral disturbance: Secondary | ICD-10-CM | POA: Diagnosis not present

## 2015-11-19 DIAGNOSIS — R262 Difficulty in walking, not elsewhere classified: Secondary | ICD-10-CM | POA: Diagnosis not present

## 2015-11-19 DIAGNOSIS — I10 Essential (primary) hypertension: Secondary | ICD-10-CM | POA: Diagnosis not present

## 2015-11-19 DIAGNOSIS — E119 Type 2 diabetes mellitus without complications: Secondary | ICD-10-CM | POA: Diagnosis not present

## 2015-11-19 DIAGNOSIS — G20 Parkinson's disease: Secondary | ICD-10-CM | POA: Diagnosis not present

## 2015-11-19 DIAGNOSIS — M6281 Muscle weakness (generalized): Secondary | ICD-10-CM | POA: Diagnosis not present

## 2015-11-21 DIAGNOSIS — E119 Type 2 diabetes mellitus without complications: Secondary | ICD-10-CM | POA: Diagnosis not present

## 2015-11-21 DIAGNOSIS — F0280 Dementia in other diseases classified elsewhere without behavioral disturbance: Secondary | ICD-10-CM | POA: Diagnosis not present

## 2015-11-21 DIAGNOSIS — E039 Hypothyroidism, unspecified: Secondary | ICD-10-CM | POA: Diagnosis not present

## 2015-11-21 DIAGNOSIS — G20 Parkinson's disease: Secondary | ICD-10-CM | POA: Diagnosis not present

## 2015-11-21 DIAGNOSIS — I10 Essential (primary) hypertension: Secondary | ICD-10-CM | POA: Diagnosis not present

## 2015-11-21 DIAGNOSIS — Z0001 Encounter for general adult medical examination with abnormal findings: Secondary | ICD-10-CM | POA: Diagnosis not present

## 2015-11-21 DIAGNOSIS — F331 Major depressive disorder, recurrent, moderate: Secondary | ICD-10-CM | POA: Diagnosis not present

## 2015-11-21 DIAGNOSIS — J309 Allergic rhinitis, unspecified: Secondary | ICD-10-CM | POA: Diagnosis not present

## 2015-11-24 DIAGNOSIS — I10 Essential (primary) hypertension: Secondary | ICD-10-CM | POA: Diagnosis not present

## 2015-11-24 DIAGNOSIS — M6281 Muscle weakness (generalized): Secondary | ICD-10-CM | POA: Diagnosis not present

## 2015-11-24 DIAGNOSIS — F0280 Dementia in other diseases classified elsewhere without behavioral disturbance: Secondary | ICD-10-CM | POA: Diagnosis not present

## 2015-11-24 DIAGNOSIS — G20 Parkinson's disease: Secondary | ICD-10-CM | POA: Diagnosis not present

## 2015-11-24 DIAGNOSIS — E119 Type 2 diabetes mellitus without complications: Secondary | ICD-10-CM | POA: Diagnosis not present

## 2015-11-24 DIAGNOSIS — R262 Difficulty in walking, not elsewhere classified: Secondary | ICD-10-CM | POA: Diagnosis not present

## 2015-11-25 DIAGNOSIS — E119 Type 2 diabetes mellitus without complications: Secondary | ICD-10-CM | POA: Diagnosis not present

## 2015-11-25 DIAGNOSIS — R262 Difficulty in walking, not elsewhere classified: Secondary | ICD-10-CM | POA: Diagnosis not present

## 2015-11-25 DIAGNOSIS — I10 Essential (primary) hypertension: Secondary | ICD-10-CM | POA: Diagnosis not present

## 2015-11-25 DIAGNOSIS — K529 Noninfective gastroenteritis and colitis, unspecified: Secondary | ICD-10-CM | POA: Diagnosis not present

## 2015-11-25 DIAGNOSIS — R1319 Other dysphagia: Secondary | ICD-10-CM | POA: Diagnosis not present

## 2015-11-25 DIAGNOSIS — F0280 Dementia in other diseases classified elsewhere without behavioral disturbance: Secondary | ICD-10-CM | POA: Diagnosis not present

## 2015-11-25 DIAGNOSIS — M6281 Muscle weakness (generalized): Secondary | ICD-10-CM | POA: Diagnosis not present

## 2015-11-25 DIAGNOSIS — G20 Parkinson's disease: Secondary | ICD-10-CM | POA: Diagnosis not present

## 2015-11-26 ENCOUNTER — Other Ambulatory Visit: Payer: Self-pay | Admitting: Neurology

## 2015-11-26 DIAGNOSIS — R1319 Other dysphagia: Secondary | ICD-10-CM

## 2015-11-27 DIAGNOSIS — G20 Parkinson's disease: Secondary | ICD-10-CM | POA: Diagnosis not present

## 2015-11-27 DIAGNOSIS — K580 Irritable bowel syndrome with diarrhea: Secondary | ICD-10-CM | POA: Diagnosis not present

## 2015-11-27 DIAGNOSIS — E039 Hypothyroidism, unspecified: Secondary | ICD-10-CM | POA: Diagnosis not present

## 2015-11-27 DIAGNOSIS — Z0001 Encounter for general adult medical examination with abnormal findings: Secondary | ICD-10-CM | POA: Diagnosis not present

## 2015-11-27 DIAGNOSIS — D509 Iron deficiency anemia, unspecified: Secondary | ICD-10-CM | POA: Diagnosis not present

## 2015-11-27 DIAGNOSIS — E559 Vitamin D deficiency, unspecified: Secondary | ICD-10-CM | POA: Diagnosis not present

## 2015-11-27 DIAGNOSIS — R262 Difficulty in walking, not elsewhere classified: Secondary | ICD-10-CM | POA: Diagnosis not present

## 2015-11-27 DIAGNOSIS — E119 Type 2 diabetes mellitus without complications: Secondary | ICD-10-CM | POA: Diagnosis not present

## 2015-11-27 DIAGNOSIS — F331 Major depressive disorder, recurrent, moderate: Secondary | ICD-10-CM | POA: Diagnosis not present

## 2015-11-27 DIAGNOSIS — I10 Essential (primary) hypertension: Secondary | ICD-10-CM | POA: Diagnosis not present

## 2015-11-27 DIAGNOSIS — J309 Allergic rhinitis, unspecified: Secondary | ICD-10-CM | POA: Diagnosis not present

## 2015-11-27 DIAGNOSIS — M6281 Muscle weakness (generalized): Secondary | ICD-10-CM | POA: Diagnosis not present

## 2015-11-27 DIAGNOSIS — F0280 Dementia in other diseases classified elsewhere without behavioral disturbance: Secondary | ICD-10-CM | POA: Diagnosis not present

## 2015-12-01 DIAGNOSIS — G20 Parkinson's disease: Secondary | ICD-10-CM | POA: Diagnosis not present

## 2015-12-01 DIAGNOSIS — F0280 Dementia in other diseases classified elsewhere without behavioral disturbance: Secondary | ICD-10-CM | POA: Diagnosis not present

## 2015-12-01 DIAGNOSIS — M6281 Muscle weakness (generalized): Secondary | ICD-10-CM | POA: Diagnosis not present

## 2015-12-01 DIAGNOSIS — I10 Essential (primary) hypertension: Secondary | ICD-10-CM | POA: Diagnosis not present

## 2015-12-01 DIAGNOSIS — E119 Type 2 diabetes mellitus without complications: Secondary | ICD-10-CM | POA: Diagnosis not present

## 2015-12-01 DIAGNOSIS — R262 Difficulty in walking, not elsewhere classified: Secondary | ICD-10-CM | POA: Diagnosis not present

## 2015-12-02 DIAGNOSIS — E119 Type 2 diabetes mellitus without complications: Secondary | ICD-10-CM | POA: Diagnosis not present

## 2015-12-02 DIAGNOSIS — R262 Difficulty in walking, not elsewhere classified: Secondary | ICD-10-CM | POA: Diagnosis not present

## 2015-12-02 DIAGNOSIS — M6281 Muscle weakness (generalized): Secondary | ICD-10-CM | POA: Diagnosis not present

## 2015-12-02 DIAGNOSIS — F0280 Dementia in other diseases classified elsewhere without behavioral disturbance: Secondary | ICD-10-CM | POA: Diagnosis not present

## 2015-12-02 DIAGNOSIS — I10 Essential (primary) hypertension: Secondary | ICD-10-CM | POA: Diagnosis not present

## 2015-12-02 DIAGNOSIS — G20 Parkinson's disease: Secondary | ICD-10-CM | POA: Diagnosis not present

## 2015-12-04 DIAGNOSIS — R262 Difficulty in walking, not elsewhere classified: Secondary | ICD-10-CM | POA: Diagnosis not present

## 2015-12-04 DIAGNOSIS — M6281 Muscle weakness (generalized): Secondary | ICD-10-CM | POA: Diagnosis not present

## 2015-12-04 DIAGNOSIS — G20 Parkinson's disease: Secondary | ICD-10-CM | POA: Diagnosis not present

## 2015-12-04 DIAGNOSIS — F0280 Dementia in other diseases classified elsewhere without behavioral disturbance: Secondary | ICD-10-CM | POA: Diagnosis not present

## 2015-12-04 DIAGNOSIS — I10 Essential (primary) hypertension: Secondary | ICD-10-CM | POA: Diagnosis not present

## 2015-12-04 DIAGNOSIS — E119 Type 2 diabetes mellitus without complications: Secondary | ICD-10-CM | POA: Diagnosis not present

## 2015-12-08 ENCOUNTER — Ambulatory Visit
Admission: RE | Admit: 2015-12-08 | Discharge: 2015-12-08 | Disposition: A | Discharge status: Home / Self Care | Payer: Medicare Other | Source: Ambulatory Visit | Attending: Neurology | Admitting: Neurology

## 2015-12-08 DIAGNOSIS — R1319 Other dysphagia: Secondary | ICD-10-CM | POA: Diagnosis not present

## 2015-12-08 DIAGNOSIS — G20 Parkinson's disease: Secondary | ICD-10-CM | POA: Diagnosis not present

## 2015-12-08 DIAGNOSIS — E119 Type 2 diabetes mellitus without complications: Secondary | ICD-10-CM | POA: Diagnosis not present

## 2015-12-08 DIAGNOSIS — R05 Cough: Secondary | ICD-10-CM | POA: Diagnosis not present

## 2015-12-08 DIAGNOSIS — K449 Diaphragmatic hernia without obstruction or gangrene: Secondary | ICD-10-CM | POA: Insufficient documentation

## 2015-12-08 DIAGNOSIS — K228 Other specified diseases of esophagus: Secondary | ICD-10-CM | POA: Insufficient documentation

## 2015-12-08 DIAGNOSIS — M6281 Muscle weakness (generalized): Secondary | ICD-10-CM | POA: Diagnosis not present

## 2015-12-08 DIAGNOSIS — K222 Esophageal obstruction: Secondary | ICD-10-CM | POA: Insufficient documentation

## 2015-12-08 DIAGNOSIS — R262 Difficulty in walking, not elsewhere classified: Secondary | ICD-10-CM | POA: Diagnosis not present

## 2015-12-08 DIAGNOSIS — F0280 Dementia in other diseases classified elsewhere without behavioral disturbance: Secondary | ICD-10-CM | POA: Diagnosis not present

## 2015-12-08 DIAGNOSIS — I10 Essential (primary) hypertension: Secondary | ICD-10-CM | POA: Diagnosis not present

## 2015-12-12 DIAGNOSIS — I10 Essential (primary) hypertension: Secondary | ICD-10-CM | POA: Diagnosis not present

## 2015-12-12 DIAGNOSIS — F0280 Dementia in other diseases classified elsewhere without behavioral disturbance: Secondary | ICD-10-CM | POA: Diagnosis not present

## 2015-12-12 DIAGNOSIS — E119 Type 2 diabetes mellitus without complications: Secondary | ICD-10-CM | POA: Diagnosis not present

## 2015-12-12 DIAGNOSIS — G20 Parkinson's disease: Secondary | ICD-10-CM | POA: Diagnosis not present

## 2015-12-12 DIAGNOSIS — M6281 Muscle weakness (generalized): Secondary | ICD-10-CM | POA: Diagnosis not present

## 2015-12-12 DIAGNOSIS — R262 Difficulty in walking, not elsewhere classified: Secondary | ICD-10-CM | POA: Diagnosis not present

## 2015-12-19 DIAGNOSIS — G20 Parkinson's disease: Secondary | ICD-10-CM | POA: Diagnosis not present

## 2015-12-19 DIAGNOSIS — R262 Difficulty in walking, not elsewhere classified: Secondary | ICD-10-CM | POA: Diagnosis not present

## 2015-12-19 DIAGNOSIS — I10 Essential (primary) hypertension: Secondary | ICD-10-CM | POA: Diagnosis not present

## 2015-12-19 DIAGNOSIS — M6281 Muscle weakness (generalized): Secondary | ICD-10-CM | POA: Diagnosis not present

## 2015-12-19 DIAGNOSIS — E119 Type 2 diabetes mellitus without complications: Secondary | ICD-10-CM | POA: Diagnosis not present

## 2015-12-19 DIAGNOSIS — F0280 Dementia in other diseases classified elsewhere without behavioral disturbance: Secondary | ICD-10-CM | POA: Diagnosis not present

## 2015-12-29 DIAGNOSIS — R1310 Dysphagia, unspecified: Secondary | ICD-10-CM | POA: Diagnosis not present

## 2015-12-29 DIAGNOSIS — M79673 Pain in unspecified foot: Secondary | ICD-10-CM | POA: Diagnosis not present

## 2015-12-29 DIAGNOSIS — K529 Noninfective gastroenteritis and colitis, unspecified: Secondary | ICD-10-CM | POA: Diagnosis not present

## 2015-12-29 DIAGNOSIS — B351 Tinea unguium: Secondary | ICD-10-CM | POA: Diagnosis not present

## 2015-12-29 DIAGNOSIS — L600 Ingrowing nail: Secondary | ICD-10-CM | POA: Diagnosis not present

## 2015-12-29 DIAGNOSIS — R262 Difficulty in walking, not elsewhere classified: Secondary | ICD-10-CM | POA: Diagnosis not present

## 2015-12-29 DIAGNOSIS — R933 Abnormal findings on diagnostic imaging of other parts of digestive tract: Secondary | ICD-10-CM | POA: Diagnosis not present

## 2016-01-12 ENCOUNTER — Emergency Department
Admission: EM | Admit: 2016-01-12 | Discharge: 2016-01-13 | Disposition: A | Discharge status: Home / Self Care | Payer: Medicare Other | Attending: Emergency Medicine | Admitting: Emergency Medicine

## 2016-01-12 ENCOUNTER — Emergency Department: Payer: Medicare Other

## 2016-01-12 DIAGNOSIS — W01198A Fall on same level from slipping, tripping and stumbling with subsequent striking against other object, initial encounter: Secondary | ICD-10-CM | POA: Insufficient documentation

## 2016-01-12 DIAGNOSIS — M542 Cervicalgia: Secondary | ICD-10-CM | POA: Diagnosis not present

## 2016-01-12 DIAGNOSIS — W19XXXA Unspecified fall, initial encounter: Secondary | ICD-10-CM | POA: Diagnosis not present

## 2016-01-12 DIAGNOSIS — R6889 Other general symptoms and signs: Secondary | ICD-10-CM | POA: Diagnosis not present

## 2016-01-12 DIAGNOSIS — S0990XA Unspecified injury of head, initial encounter: Secondary | ICD-10-CM | POA: Diagnosis not present

## 2016-01-12 DIAGNOSIS — Y9389 Activity, other specified: Secondary | ICD-10-CM | POA: Diagnosis not present

## 2016-01-12 DIAGNOSIS — Y999 Unspecified external cause status: Secondary | ICD-10-CM | POA: Diagnosis not present

## 2016-01-12 DIAGNOSIS — Y92121 Bathroom in nursing home as the place of occurrence of the external cause: Secondary | ICD-10-CM | POA: Insufficient documentation

## 2016-01-12 DIAGNOSIS — G20 Parkinson's disease: Secondary | ICD-10-CM | POA: Insufficient documentation

## 2016-01-12 DIAGNOSIS — I10 Essential (primary) hypertension: Secondary | ICD-10-CM | POA: Diagnosis not present

## 2016-01-12 DIAGNOSIS — E119 Type 2 diabetes mellitus without complications: Secondary | ICD-10-CM | POA: Diagnosis not present

## 2016-01-12 DIAGNOSIS — S199XXA Unspecified injury of neck, initial encounter: Secondary | ICD-10-CM | POA: Diagnosis not present

## 2016-01-12 HISTORY — DX: Essential (primary) hypertension: I10

## 2016-01-12 HISTORY — DX: Parkinson's disease: G20

## 2016-01-12 HISTORY — DX: Hyperlipidemia, unspecified: E78.5

## 2016-01-12 HISTORY — DX: Gastro-esophageal reflux disease without esophagitis: K21.9

## 2016-01-12 HISTORY — DX: Anemia, unspecified: D64.9

## 2016-01-12 HISTORY — DX: Type 2 diabetes mellitus without complications: E11.9

## 2016-01-12 HISTORY — DX: Unspecified dementia, unspecified severity, without behavioral disturbance, psychotic disturbance, mood disturbance, and anxiety: F03.90

## 2016-01-12 NOTE — ED Triage Notes (Signed)
Pt presents to ED via [facility] for a fall in her shower. States she slipped. Pt is from the [facility]. Pt states she sat on the edge of the tub and slipped right in, states she hit her head on tub, states "little twinge across eyebrow line." No noticeable swelling or bruising per EMS.

## 2016-01-12 NOTE — ED Provider Notes (Signed)
[facility] Emergency Department Provider Note   ____________________________________________   First MD Initiated Contact with Patient 01/12/16 2318     (approximate)  I have reviewed the triage vital signs and the nursing notes.   HISTORY  Chief Complaint Fall    HPI Vanessa Montes is a 82 y.o. female who comes into the hospital today after a fall at her nursing home.The patient reports that she was trying to do a bath. She was sitting on the side of the tub and reports that the next thing she knew she was in the tub hitting her head. She reports that she thinks she misjudged the distance to sit on the top. She reports that this caused her to fall. She reports that there was someone with her who was helping her finish her bath when it occurred. The patient's family reports that she often has some difficulty judging distances when she is sitting and has fallen due to that in the past. She also reports that the patient has Parkinson's and falls frequently. The patient denies loss of consciousness. She denies any blurred vision, vomiting, chest pain, abdominal pain or back pain. The patient reports that she always has shortness of breath but there is nothing more than normal. She has mild tingling in her head and reports that her neck hurts a little bit when she turns her head side to side. The patient is here tonight for evaluation.   Past Medical History:  Diagnosis Date  . Anemia   . Dementia   . Diabetes mellitus without complication ([place])   . GERD (gastroesophageal reflux disease)   . HLD (hyperlipidemia)   . Hypertension   . Parkinson's disease ([place])     There are no active problems to display for this patient.   Past Surgical History:  Procedure Laterality Date  . ABDOMINAL HYSTERECTOMY    . APPENDECTOMY    . JOINT REPLACEMENT    . PARATHYROIDECTOMY      Prior to Admission medications   Not on File    Allergies Review of patient's  allergies indicates no known allergies.  History reviewed. No pertinent family history.  Social History Social History  Substance Use Topics  . Smoking status: Unknown If Ever Smoked  . Smokeless tobacco: Not on file  . Alcohol use No    Review of Systems Constitutional: No fever/chills Eyes: No visual changes. ENT: No sore throat. Cardiovascular: Denies chest pain. Respiratory: Denies shortness of breath. Gastrointestinal: No abdominal pain.  No nausea, no vomiting.  No diarrhea.  No constipation. Genitourinary: Negative for dysuria. Musculoskeletal: Negative for back pain. Skin: Negative for rash. Neurological: Negative for headaches, focal weakness or numbness.  10-point ROS otherwise negative.  ____________________________________________   PHYSICAL EXAM:  VITAL SIGNS: ED Triage Vitals  Enc Vitals Group     BP 01/12/16 2237 (!) 150/75     Pulse Rate 01/12/16 2237 60     Resp 01/12/16 2237 16     Temp 01/12/16 2237 98.4 F (36.9 C)     Temp Source 01/12/16 2237 Oral     SpO2 01/12/16 2237 99 %     Weight --      Height --      Head Circumference --      Peak Flow --      Pain Score 01/12/16 2238 2     Pain Loc --      Pain Edu? --      Excl. in [place]? --  Constitutional: Alert and oriented. Well appearing and in no acute distress. Eyes: Conjunctivae are normal. PERRL. EOMI. Head: Atraumatic. Nose: No congestion/rhinnorhea. Neck: Mild Cervical spine tenderness to palpation Mouth/Throat: Mucous membranes are moist.  Oropharynx non-erythematous. Cardiovascular: Normal rate, regular rhythm. Grossly normal heart sounds.  Good peripheral circulation. Respiratory: Normal respiratory effort.  No retractions. Lungs CTAB. Gastrointestinal: Soft and nontender. No distention. Positive bowel sounds Musculoskeletal: No lower extremity tenderness nor edema.   Neurologic:  Normal speech and language. Cranial nerves II through XII grossly intact with no focal motor no  deficits Skin:  Skin is warm, dry and intact. No rash noted. Psychiatric: Mood and affect are normal.   ____________________________________________   LABS (all labs ordered are listed, but only abnormal results are displayed)  Labs Reviewed - No data to display ____________________________________________  EKG  none ____________________________________________  RADIOLOGY  CT head and cervical spine ____________________________________________   PROCEDURES  Procedure(s) performed: None  Procedures  Critical Care performed: No  ____________________________________________   INITIAL IMPRESSION / ASSESSMENT AND PLAN / ED COURSE  Pertinent labs & imaging results that were available during my care of the patient were reviewed by me and considered in my medical decision making (see chart for details).  This is an 82 year old female who comes into the hospital today with a fall at her nursing home. The patient did not have any acid consciousness or fall due to dizziness or any other cause. She does have frequent instances where she loses her balance. I will send the patient for CT scan of her head and cervical spine and the patient will be reassessed.  Clinical Course  Value Comment By Time  CT Head Wo Contrast CT HEAD: No acute intracranial process.  Stable examination including moderate to severe chronic small vessel ischemic disease and old thalamus lacunar infarcts.  CT CERVICAL SPINE: No acute fracture or malalignment.  Moderate to severe canal stenosis C5-6. Severe LEFT C3-4 and severe LEFT C6-7 neural foraminal narrowing.  Partially imaged small LEFT pleural effusion.   Loney Hering, MD 10/03 0104   Patient's CT head and cervical spine does not show any acute traumatic injury. As the patient has a history of falls given her Parkinson's and her daughter reports that this is very typical of the patient's falls I did not do any further blood work,  imaging or urinalysis studies. The patient will be discharged back to her nursing home.  ____________________________________________   FINAL CLINICAL IMPRESSION(S) / ED DIAGNOSES  Final diagnoses:  Fall, initial encounter  Injury of head, initial encounter      NEW MEDICATIONS STARTED DURING THIS VISIT:  New Prescriptions   No medications on file     Note:  This document was prepared using Dragon voice recognition software and may include unintentional dictation errors.    Loney Hering, MD 01/13/16 [phone]

## 2016-01-12 NOTE — ED Notes (Signed)
Pt is deaf, hearing aids not in. Have to talk close to ear for pt to hear.

## 2016-01-12 NOTE — ED Notes (Signed)
Patient transported to CT 

## 2016-01-12 NOTE — ED Notes (Signed)
Pt returned from CT. No signs of distress noted.

## 2016-01-13 DIAGNOSIS — S0990XA Unspecified injury of head, initial encounter: Secondary | ICD-10-CM | POA: Diagnosis not present

## 2016-01-13 DIAGNOSIS — S199XXA Unspecified injury of neck, initial encounter: Secondary | ICD-10-CM | POA: Diagnosis not present

## 2016-01-15 DIAGNOSIS — M810 Age-related osteoporosis without current pathological fracture: Secondary | ICD-10-CM | POA: Diagnosis not present

## 2016-01-15 DIAGNOSIS — F0280 Dementia in other diseases classified elsewhere without behavioral disturbance: Secondary | ICD-10-CM | POA: Diagnosis not present

## 2016-01-15 DIAGNOSIS — G20 Parkinson's disease: Secondary | ICD-10-CM | POA: Diagnosis not present

## 2016-01-15 DIAGNOSIS — I10 Essential (primary) hypertension: Secondary | ICD-10-CM | POA: Diagnosis not present

## 2016-01-15 DIAGNOSIS — E119 Type 2 diabetes mellitus without complications: Secondary | ICD-10-CM | POA: Diagnosis not present

## 2016-01-15 DIAGNOSIS — I6523 Occlusion and stenosis of bilateral carotid arteries: Secondary | ICD-10-CM | POA: Diagnosis not present

## 2016-01-20 DIAGNOSIS — I6523 Occlusion and stenosis of bilateral carotid arteries: Secondary | ICD-10-CM | POA: Diagnosis not present

## 2016-01-20 DIAGNOSIS — I10 Essential (primary) hypertension: Secondary | ICD-10-CM | POA: Diagnosis not present

## 2016-01-20 DIAGNOSIS — G20 Parkinson's disease: Secondary | ICD-10-CM | POA: Diagnosis not present

## 2016-01-20 DIAGNOSIS — M810 Age-related osteoporosis without current pathological fracture: Secondary | ICD-10-CM | POA: Diagnosis not present

## 2016-01-20 DIAGNOSIS — E119 Type 2 diabetes mellitus without complications: Secondary | ICD-10-CM | POA: Diagnosis not present

## 2016-01-20 DIAGNOSIS — F0280 Dementia in other diseases classified elsewhere without behavioral disturbance: Secondary | ICD-10-CM | POA: Diagnosis not present

## 2016-01-21 DIAGNOSIS — M810 Age-related osteoporosis without current pathological fracture: Secondary | ICD-10-CM | POA: Diagnosis not present

## 2016-01-21 DIAGNOSIS — F0280 Dementia in other diseases classified elsewhere without behavioral disturbance: Secondary | ICD-10-CM | POA: Diagnosis not present

## 2016-01-21 DIAGNOSIS — I6523 Occlusion and stenosis of bilateral carotid arteries: Secondary | ICD-10-CM | POA: Diagnosis not present

## 2016-01-21 DIAGNOSIS — I10 Essential (primary) hypertension: Secondary | ICD-10-CM | POA: Diagnosis not present

## 2016-01-21 DIAGNOSIS — G20 Parkinson's disease: Secondary | ICD-10-CM | POA: Diagnosis not present

## 2016-01-21 DIAGNOSIS — E119 Type 2 diabetes mellitus without complications: Secondary | ICD-10-CM | POA: Diagnosis not present

## 2016-01-23 DIAGNOSIS — I6523 Occlusion and stenosis of bilateral carotid arteries: Secondary | ICD-10-CM | POA: Diagnosis not present

## 2016-01-23 DIAGNOSIS — E119 Type 2 diabetes mellitus without complications: Secondary | ICD-10-CM | POA: Diagnosis not present

## 2016-01-23 DIAGNOSIS — M810 Age-related osteoporosis without current pathological fracture: Secondary | ICD-10-CM | POA: Diagnosis not present

## 2016-01-23 DIAGNOSIS — F0280 Dementia in other diseases classified elsewhere without behavioral disturbance: Secondary | ICD-10-CM | POA: Diagnosis not present

## 2016-01-23 DIAGNOSIS — G20 Parkinson's disease: Secondary | ICD-10-CM | POA: Diagnosis not present

## 2016-01-23 DIAGNOSIS — I10 Essential (primary) hypertension: Secondary | ICD-10-CM | POA: Diagnosis not present

## 2016-01-26 DIAGNOSIS — I10 Essential (primary) hypertension: Secondary | ICD-10-CM | POA: Diagnosis not present

## 2016-01-26 DIAGNOSIS — E039 Hypothyroidism, unspecified: Secondary | ICD-10-CM | POA: Diagnosis not present

## 2016-01-26 DIAGNOSIS — E119 Type 2 diabetes mellitus without complications: Secondary | ICD-10-CM | POA: Diagnosis not present

## 2016-01-26 DIAGNOSIS — G20 Parkinson's disease: Secondary | ICD-10-CM | POA: Diagnosis not present

## 2016-01-26 DIAGNOSIS — J309 Allergic rhinitis, unspecified: Secondary | ICD-10-CM | POA: Diagnosis not present

## 2016-01-26 DIAGNOSIS — I6523 Occlusion and stenosis of bilateral carotid arteries: Secondary | ICD-10-CM | POA: Diagnosis not present

## 2016-01-26 DIAGNOSIS — M810 Age-related osteoporosis without current pathological fracture: Secondary | ICD-10-CM | POA: Diagnosis not present

## 2016-01-26 DIAGNOSIS — F0280 Dementia in other diseases classified elsewhere without behavioral disturbance: Secondary | ICD-10-CM | POA: Diagnosis not present

## 2016-01-26 DIAGNOSIS — K581 Irritable bowel syndrome with constipation: Secondary | ICD-10-CM | POA: Diagnosis not present

## 2016-01-26 DIAGNOSIS — F331 Major depressive disorder, recurrent, moderate: Secondary | ICD-10-CM | POA: Diagnosis not present

## 2016-01-27 DIAGNOSIS — I10 Essential (primary) hypertension: Secondary | ICD-10-CM | POA: Diagnosis not present

## 2016-01-27 DIAGNOSIS — F0280 Dementia in other diseases classified elsewhere without behavioral disturbance: Secondary | ICD-10-CM | POA: Diagnosis not present

## 2016-01-27 DIAGNOSIS — G20 Parkinson's disease: Secondary | ICD-10-CM | POA: Diagnosis not present

## 2016-01-27 DIAGNOSIS — E119 Type 2 diabetes mellitus without complications: Secondary | ICD-10-CM | POA: Diagnosis not present

## 2016-01-27 DIAGNOSIS — I6523 Occlusion and stenosis of bilateral carotid arteries: Secondary | ICD-10-CM | POA: Diagnosis not present

## 2016-01-27 DIAGNOSIS — M810 Age-related osteoporosis without current pathological fracture: Secondary | ICD-10-CM | POA: Diagnosis not present

## 2016-01-28 DIAGNOSIS — G20 Parkinson's disease: Secondary | ICD-10-CM | POA: Diagnosis not present

## 2016-01-28 DIAGNOSIS — I10 Essential (primary) hypertension: Secondary | ICD-10-CM | POA: Diagnosis not present

## 2016-01-28 DIAGNOSIS — E119 Type 2 diabetes mellitus without complications: Secondary | ICD-10-CM | POA: Diagnosis not present

## 2016-01-28 DIAGNOSIS — M810 Age-related osteoporosis without current pathological fracture: Secondary | ICD-10-CM | POA: Diagnosis not present

## 2016-01-28 DIAGNOSIS — F0280 Dementia in other diseases classified elsewhere without behavioral disturbance: Secondary | ICD-10-CM | POA: Diagnosis not present

## 2016-01-28 DIAGNOSIS — I6523 Occlusion and stenosis of bilateral carotid arteries: Secondary | ICD-10-CM | POA: Diagnosis not present

## 2016-01-29 DIAGNOSIS — G20 Parkinson's disease: Secondary | ICD-10-CM | POA: Diagnosis not present

## 2016-01-29 DIAGNOSIS — I6523 Occlusion and stenosis of bilateral carotid arteries: Secondary | ICD-10-CM | POA: Diagnosis not present

## 2016-01-29 DIAGNOSIS — M810 Age-related osteoporosis without current pathological fracture: Secondary | ICD-10-CM | POA: Diagnosis not present

## 2016-01-29 DIAGNOSIS — E119 Type 2 diabetes mellitus without complications: Secondary | ICD-10-CM | POA: Diagnosis not present

## 2016-01-29 DIAGNOSIS — F0280 Dementia in other diseases classified elsewhere without behavioral disturbance: Secondary | ICD-10-CM | POA: Diagnosis not present

## 2016-01-29 DIAGNOSIS — I10 Essential (primary) hypertension: Secondary | ICD-10-CM | POA: Diagnosis not present

## 2016-02-02 DIAGNOSIS — I6523 Occlusion and stenosis of bilateral carotid arteries: Secondary | ICD-10-CM | POA: Diagnosis not present

## 2016-02-02 DIAGNOSIS — M810 Age-related osteoporosis without current pathological fracture: Secondary | ICD-10-CM | POA: Diagnosis not present

## 2016-02-02 DIAGNOSIS — I10 Essential (primary) hypertension: Secondary | ICD-10-CM | POA: Diagnosis not present

## 2016-02-02 DIAGNOSIS — E119 Type 2 diabetes mellitus without complications: Secondary | ICD-10-CM | POA: Diagnosis not present

## 2016-02-02 DIAGNOSIS — F0280 Dementia in other diseases classified elsewhere without behavioral disturbance: Secondary | ICD-10-CM | POA: Diagnosis not present

## 2016-02-02 DIAGNOSIS — G20 Parkinson's disease: Secondary | ICD-10-CM | POA: Diagnosis not present

## 2016-02-04 DIAGNOSIS — I10 Essential (primary) hypertension: Secondary | ICD-10-CM | POA: Diagnosis not present

## 2016-02-04 DIAGNOSIS — G20 Parkinson's disease: Secondary | ICD-10-CM | POA: Diagnosis not present

## 2016-02-04 DIAGNOSIS — E119 Type 2 diabetes mellitus without complications: Secondary | ICD-10-CM | POA: Diagnosis not present

## 2016-02-04 DIAGNOSIS — M810 Age-related osteoporosis without current pathological fracture: Secondary | ICD-10-CM | POA: Diagnosis not present

## 2016-02-04 DIAGNOSIS — I6523 Occlusion and stenosis of bilateral carotid arteries: Secondary | ICD-10-CM | POA: Diagnosis not present

## 2016-02-04 DIAGNOSIS — F0280 Dementia in other diseases classified elsewhere without behavioral disturbance: Secondary | ICD-10-CM | POA: Diagnosis not present

## 2016-02-06 DIAGNOSIS — M810 Age-related osteoporosis without current pathological fracture: Secondary | ICD-10-CM | POA: Diagnosis not present

## 2016-02-06 DIAGNOSIS — I6523 Occlusion and stenosis of bilateral carotid arteries: Secondary | ICD-10-CM | POA: Diagnosis not present

## 2016-02-06 DIAGNOSIS — F0280 Dementia in other diseases classified elsewhere without behavioral disturbance: Secondary | ICD-10-CM | POA: Diagnosis not present

## 2016-02-06 DIAGNOSIS — G20 Parkinson's disease: Secondary | ICD-10-CM | POA: Diagnosis not present

## 2016-02-06 DIAGNOSIS — E119 Type 2 diabetes mellitus without complications: Secondary | ICD-10-CM | POA: Diagnosis not present

## 2016-02-06 DIAGNOSIS — I10 Essential (primary) hypertension: Secondary | ICD-10-CM | POA: Diagnosis not present

## 2016-02-09 DIAGNOSIS — G20 Parkinson's disease: Secondary | ICD-10-CM | POA: Diagnosis not present

## 2016-02-09 DIAGNOSIS — E119 Type 2 diabetes mellitus without complications: Secondary | ICD-10-CM | POA: Diagnosis not present

## 2016-02-09 DIAGNOSIS — I10 Essential (primary) hypertension: Secondary | ICD-10-CM | POA: Diagnosis not present

## 2016-02-09 DIAGNOSIS — I6523 Occlusion and stenosis of bilateral carotid arteries: Secondary | ICD-10-CM | POA: Diagnosis not present

## 2016-02-09 DIAGNOSIS — M810 Age-related osteoporosis without current pathological fracture: Secondary | ICD-10-CM | POA: Diagnosis not present

## 2016-02-09 DIAGNOSIS — F0280 Dementia in other diseases classified elsewhere without behavioral disturbance: Secondary | ICD-10-CM | POA: Diagnosis not present

## 2016-02-11 DIAGNOSIS — I6523 Occlusion and stenosis of bilateral carotid arteries: Secondary | ICD-10-CM | POA: Diagnosis not present

## 2016-02-11 DIAGNOSIS — F0280 Dementia in other diseases classified elsewhere without behavioral disturbance: Secondary | ICD-10-CM | POA: Diagnosis not present

## 2016-02-11 DIAGNOSIS — E119 Type 2 diabetes mellitus without complications: Secondary | ICD-10-CM | POA: Diagnosis not present

## 2016-02-11 DIAGNOSIS — I10 Essential (primary) hypertension: Secondary | ICD-10-CM | POA: Diagnosis not present

## 2016-02-11 DIAGNOSIS — M810 Age-related osteoporosis without current pathological fracture: Secondary | ICD-10-CM | POA: Diagnosis not present

## 2016-02-11 DIAGNOSIS — G20 Parkinson's disease: Secondary | ICD-10-CM | POA: Diagnosis not present

## 2016-02-14 ENCOUNTER — Emergency Department
Admission: EM | Admit: 2016-02-14 | Discharge: 2016-02-14 | Disposition: A | Discharge status: Home / Self Care | Payer: Medicare Other | Attending: Emergency Medicine | Admitting: Emergency Medicine

## 2016-02-14 DIAGNOSIS — I10 Essential (primary) hypertension: Secondary | ICD-10-CM | POA: Insufficient documentation

## 2016-02-14 DIAGNOSIS — L299 Pruritus, unspecified: Secondary | ICD-10-CM | POA: Insufficient documentation

## 2016-02-14 DIAGNOSIS — G20 Parkinson's disease: Secondary | ICD-10-CM | POA: Diagnosis not present

## 2016-02-14 DIAGNOSIS — E119 Type 2 diabetes mellitus without complications: Secondary | ICD-10-CM | POA: Diagnosis not present

## 2016-02-14 DIAGNOSIS — R6889 Other general symptoms and signs: Secondary | ICD-10-CM | POA: Diagnosis not present

## 2016-02-14 MED ORDER — DIPHENHYDRAMINE HCL 12.5 MG/5ML PO ELIX
12.5000 mg | ORAL_SOLUTION | Freq: Once | ORAL | Status: AC
  Administered 2016-02-14: 12.5 mg via ORAL
  Filled 2016-02-14: qty 5

## 2016-02-14 NOTE — ED Triage Notes (Signed)
Pt bib EMS from the [facility] w/ c/o "itching all over".  Pt alert and oriented to self and situation.  Pt denies eating anything different or changes to her routine.  Pt denies CP, any changes in breathing, fever or n/v/d.  Pt sts itching is in different locations, primarily arms and chest.  NAD.

## 2016-02-14 NOTE — ED Provider Notes (Signed)
[facility] Emergency Department Provider Note   ____________________________________________   First MD Initiated Contact with Patient 02/14/16 1510     (approximate)  I have reviewed the triage vital signs and the nursing notes.   HISTORY  Chief Complaint Pruritis   HPI Vanessa Montes is a 82 y.o. female atrial female history of Parkinson's and dementia  Approximately 3 hours ago, after eating lunch she started developing itching mostly over her chest and back, but also felt as though her nose hairs were itching. She has not tried any medication, denies any rash, nausea, vomiting or trouble breathing wheezing or other concerns. No new medications.  Patient reports she just continues to feel like she has to itch mostly on her mid back at this time  Daughters with her, reports that this seems to happen once in the past a few years ago and no cause was ever identified.   Past Medical History:  Diagnosis Date  . Anemia   . Dementia   . Diabetes mellitus without complication ([place])   . GERD (gastroesophageal reflux disease)   . HLD (hyperlipidemia)   . Hypertension   . Parkinson's disease ([place])     There are no active problems to display for this patient.   Past Surgical History:  Procedure Laterality Date  . ABDOMINAL HYSTERECTOMY    . APPENDECTOMY    . JOINT REPLACEMENT    . PARATHYROIDECTOMY      Prior to Admission medications   Not on File    Allergies Sulfa antibiotics  No family history on file.  Social History Social History  Substance Use Topics  . Smoking status: Unknown If Ever Smoked  . Smokeless tobacco: Not on file  . Alcohol use No    Review of Systems Constitutional: No fever/chills Eyes: No visual changes. ENT: No sore throat. Cardiovascular: Denies chest pain. Respiratory: Denies shortness of breath. Gastrointestinal: No abdominal pain.  No nausea, no vomiting.  No diarrhea.  No  constipation. Genitourinary: Negative for dysuria. Musculoskeletal: Negative for back pain. Skin: Negative for rash, see history of present illness. Neurological: Negative for headaches, focal weakness or numbness. She does have chronic muscle weakness and difficulty walking due to Parkinson's  10-point ROS otherwise negative.  ____________________________________________   PHYSICAL EXAM:  VITAL SIGNS: ED Triage Vitals  Enc Vitals Group     BP 02/14/16 1450 120/71     Pulse Rate 02/14/16 1450 60     Resp 02/14/16 1450 18     Temp 02/14/16 1450 98.1 F (36.7 C)     Temp Source 02/14/16 1450 Oral     SpO2 02/14/16 1450 100 %     Weight --      Height --      Head Circumference --      Peak Flow --      Pain Score 02/14/16 1453 0     Pain Loc --      Pain Edu? --      Excl. in [place]? --     Constitutional: Alert and orientedTo self, place and order. Well appearing and in no acute distress. Eyes: Conjunctivae are normal. PERRL. EOMI. Head: Atraumatic. Nose: No congestion/rhinnorhea. Mouth/Throat: Mucous membranes are moist.  Oropharynx non-erythematous. Neck: No stridor.   Cardiovascular: Normal rate, regular rhythm. Grossly normal heart sounds.  Good peripheral circulation. Respiratory: Normal respiratory effort.  No retractions. Lungs CTAB. Gastrointestinal: Soft and nontender. No distention. No abdominal bruits. Musculoskeletal: No lower extremity tenderness nor edema.  No joint effusions.Patient is able to assist herself to the bed. Shuffling parkinsonian gait Neurologic:  Normal speech and language. No gross focal neurologic deficits are appreciated.  Skin:  Skin is warm, dry and intact. No rash noted. Psychiatric: Mood and affect are normal. Speech and behavior are normal.  ____________________________________________   LABS (all labs ordered are listed, but only abnormal results are displayed)  Labs Reviewed - No data to  display ____________________________________________  EKG   ____________________________________________  RADIOLOGY   ____________________________________________   PROCEDURES  Procedure(s) performed: None  Procedures  Critical Care performed: No  ____________________________________________   INITIAL IMPRESSION / ASSESSMENT AND PLAN / ED COURSE  Pertinent labs & imaging results that were available during my care of the patient were reviewed by me and considered in my medical decision making (see chart for details).  Pruritus. No evidence of acute anaphylaxis or evidence to support moderate symptomatology. Etiology unknown. Patient does report itching has improved somewhat, and she is awake and alert in no distress. No cardiac pulmonary neurologic symptoms.  As the patient appears quite stable, no evidence of significant reaction at this time other than itching we will trial her on low-dose Benadryl. Discussed risks and benefits of Benadryl and particular risk for making her sleepy, or causing behavioral changes, confusion with the patient and her daughter. They will utilize at most 25 mg every 6 hours, and I recommended that they dose 12.5 mg every 3 hours at first.  Return precautions and treatment recommendations and follow-up discussed with the patient who is agreeable with the plan.   Clinical Course     ____________________________________________   FINAL CLINICAL IMPRESSION(S) / ED DIAGNOSES  Final diagnoses:  Itching      NEW MEDICATIONS STARTED DURING THIS VISIT:  New Prescriptions   No medications on file     Note:  This document was prepared using Dragon voice recognition software and may include unintentional dictation errors.     Delman Kitten, MD 02/14/16 1540

## 2016-02-14 NOTE — Discharge Instructions (Signed)
You may utilize Benadryl, 12.5 mg every 3 hours as needed for itching. He is advised this may cause you to feel tired, or if you develop any confusion or change in behavior while taking it please discontinue. Do not exceed 25 mg of benadryl in total every 6 hours.

## 2016-02-17 DIAGNOSIS — F0280 Dementia in other diseases classified elsewhere without behavioral disturbance: Secondary | ICD-10-CM | POA: Diagnosis not present

## 2016-02-17 DIAGNOSIS — I6523 Occlusion and stenosis of bilateral carotid arteries: Secondary | ICD-10-CM | POA: Diagnosis not present

## 2016-02-17 DIAGNOSIS — M810 Age-related osteoporosis without current pathological fracture: Secondary | ICD-10-CM | POA: Diagnosis not present

## 2016-02-17 DIAGNOSIS — E119 Type 2 diabetes mellitus without complications: Secondary | ICD-10-CM | POA: Diagnosis not present

## 2016-02-17 DIAGNOSIS — G20 Parkinson's disease: Secondary | ICD-10-CM | POA: Diagnosis not present

## 2016-02-17 DIAGNOSIS — I10 Essential (primary) hypertension: Secondary | ICD-10-CM | POA: Diagnosis not present

## 2016-02-18 DIAGNOSIS — I6523 Occlusion and stenosis of bilateral carotid arteries: Secondary | ICD-10-CM | POA: Diagnosis not present

## 2016-02-18 DIAGNOSIS — F0280 Dementia in other diseases classified elsewhere without behavioral disturbance: Secondary | ICD-10-CM | POA: Diagnosis not present

## 2016-02-18 DIAGNOSIS — I10 Essential (primary) hypertension: Secondary | ICD-10-CM | POA: Diagnosis not present

## 2016-02-18 DIAGNOSIS — M810 Age-related osteoporosis without current pathological fracture: Secondary | ICD-10-CM | POA: Diagnosis not present

## 2016-02-18 DIAGNOSIS — E119 Type 2 diabetes mellitus without complications: Secondary | ICD-10-CM | POA: Diagnosis not present

## 2016-02-18 DIAGNOSIS — G20 Parkinson's disease: Secondary | ICD-10-CM | POA: Diagnosis not present

## 2016-02-24 DIAGNOSIS — I10 Essential (primary) hypertension: Secondary | ICD-10-CM | POA: Diagnosis not present

## 2016-02-24 DIAGNOSIS — I6523 Occlusion and stenosis of bilateral carotid arteries: Secondary | ICD-10-CM | POA: Diagnosis not present

## 2016-02-24 DIAGNOSIS — M810 Age-related osteoporosis without current pathological fracture: Secondary | ICD-10-CM | POA: Diagnosis not present

## 2016-02-24 DIAGNOSIS — E119 Type 2 diabetes mellitus without complications: Secondary | ICD-10-CM | POA: Diagnosis not present

## 2016-02-24 DIAGNOSIS — G20 Parkinson's disease: Secondary | ICD-10-CM | POA: Diagnosis not present

## 2016-02-24 DIAGNOSIS — F0280 Dementia in other diseases classified elsewhere without behavioral disturbance: Secondary | ICD-10-CM | POA: Diagnosis not present

## 2016-02-26 DIAGNOSIS — I6523 Occlusion and stenosis of bilateral carotid arteries: Secondary | ICD-10-CM | POA: Diagnosis not present

## 2016-02-26 DIAGNOSIS — I10 Essential (primary) hypertension: Secondary | ICD-10-CM | POA: Diagnosis not present

## 2016-02-26 DIAGNOSIS — F0280 Dementia in other diseases classified elsewhere without behavioral disturbance: Secondary | ICD-10-CM | POA: Diagnosis not present

## 2016-02-26 DIAGNOSIS — G20 Parkinson's disease: Secondary | ICD-10-CM | POA: Diagnosis not present

## 2016-02-26 DIAGNOSIS — M810 Age-related osteoporosis without current pathological fracture: Secondary | ICD-10-CM | POA: Diagnosis not present

## 2016-02-26 DIAGNOSIS — E119 Type 2 diabetes mellitus without complications: Secondary | ICD-10-CM | POA: Diagnosis not present

## 2016-03-01 DIAGNOSIS — B351 Tinea unguium: Secondary | ICD-10-CM | POA: Diagnosis not present

## 2016-03-01 DIAGNOSIS — B070 Plantar wart: Secondary | ICD-10-CM | POA: Diagnosis not present

## 2016-03-01 DIAGNOSIS — M6281 Muscle weakness (generalized): Secondary | ICD-10-CM | POA: Diagnosis not present

## 2016-03-01 DIAGNOSIS — L851 Acquired keratosis [keratoderma] palmaris et plantaris: Secondary | ICD-10-CM | POA: Diagnosis not present

## 2016-03-01 DIAGNOSIS — F0280 Dementia in other diseases classified elsewhere without behavioral disturbance: Secondary | ICD-10-CM | POA: Diagnosis not present

## 2016-03-01 DIAGNOSIS — K581 Irritable bowel syndrome with constipation: Secondary | ICD-10-CM | POA: Diagnosis not present

## 2016-03-01 DIAGNOSIS — B372 Candidiasis of skin and nail: Secondary | ICD-10-CM | POA: Diagnosis not present

## 2016-03-01 DIAGNOSIS — M79673 Pain in unspecified foot: Secondary | ICD-10-CM | POA: Diagnosis not present

## 2016-03-02 ENCOUNTER — Encounter: Payer: Self-pay | Admitting: *Deleted

## 2016-03-03 ENCOUNTER — Ambulatory Visit: Payer: Medicare Other | Admitting: Anesthesiology

## 2016-03-03 ENCOUNTER — Encounter
Admission: RE | Disposition: A | Discharge status: Home / Self Care | Payer: Self-pay | Source: Ambulatory Visit | Attending: Unknown Physician Specialty

## 2016-03-03 ENCOUNTER — Ambulatory Visit
Admission: RE | Admit: 2016-03-03 | Discharge: 2016-03-03 | Disposition: A | Discharge status: Home / Self Care | Payer: Medicare Other | Source: Ambulatory Visit | Attending: Unknown Physician Specialty | Admitting: Unknown Physician Specialty

## 2016-03-03 DIAGNOSIS — F0280 Dementia in other diseases classified elsewhere without behavioral disturbance: Secondary | ICD-10-CM | POA: Diagnosis not present

## 2016-03-03 DIAGNOSIS — R1310 Dysphagia, unspecified: Secondary | ICD-10-CM | POA: Diagnosis present

## 2016-03-03 DIAGNOSIS — K222 Esophageal obstruction: Secondary | ICD-10-CM | POA: Diagnosis not present

## 2016-03-03 DIAGNOSIS — I10 Essential (primary) hypertension: Secondary | ICD-10-CM | POA: Insufficient documentation

## 2016-03-03 DIAGNOSIS — F329 Major depressive disorder, single episode, unspecified: Secondary | ICD-10-CM | POA: Insufficient documentation

## 2016-03-03 DIAGNOSIS — E119 Type 2 diabetes mellitus without complications: Secondary | ICD-10-CM | POA: Diagnosis not present

## 2016-03-03 DIAGNOSIS — G20 Parkinson's disease: Secondary | ICD-10-CM | POA: Insufficient documentation

## 2016-03-03 HISTORY — DX: Parkinson's disease: G20

## 2016-03-03 HISTORY — DX: Occlusion and stenosis of unspecified carotid artery: I65.29

## 2016-03-03 HISTORY — PX: ESOPHAGOGASTRODUODENOSCOPY (EGD) WITH PROPOFOL: SHX5813

## 2016-03-03 HISTORY — DX: Nontoxic single thyroid nodule: E04.1

## 2016-03-03 HISTORY — DX: Depression, unspecified: F32.A

## 2016-03-03 HISTORY — DX: Major depressive disorder, single episode, unspecified: F32.9

## 2016-03-03 SURGERY — ESOPHAGOGASTRODUODENOSCOPY (EGD) WITH PROPOFOL
Anesthesia: General

## 2016-03-03 MED ORDER — SODIUM CHLORIDE 0.9 % IV SOLN
INTRAVENOUS | Status: DC
  Administered 2016-03-03: 11:00:00 via INTRAVENOUS

## 2016-03-03 MED ORDER — LIDOCAINE HCL (CARDIAC) 20 MG/ML IV SOLN
INTRAVENOUS | Status: DC | PRN
  Administered 2016-03-03: 40 mg via INTRAVENOUS

## 2016-03-03 MED ORDER — SODIUM CHLORIDE 0.9 % IV SOLN: INTRAVENOUS | Status: DC

## 2016-03-03 MED ORDER — GLYCOPYRROLATE 0.2 MG/ML IJ SOLN
INTRAMUSCULAR | Status: DC | PRN
Start: 2016-03-03 — End: 2016-03-03
  Administered 2016-03-03: 0.2 mg via INTRAVENOUS

## 2016-03-03 MED ORDER — PROPOFOL 500 MG/50ML IV EMUL
INTRAVENOUS | Status: DC | PRN
  Administered 2016-03-03: 50 ug/kg/min via INTRAVENOUS

## 2016-03-03 MED ORDER — FENTANYL CITRATE (PF) 100 MCG/2ML IJ SOLN
INTRAMUSCULAR | Status: DC | PRN
  Administered 2016-03-03: 12.5 ug via INTRAVENOUS

## 2016-03-03 MED ORDER — PIPERACILLIN-TAZOBACTAM 3.375 G IVPB
3.3750 g | INTRAVENOUS | Status: AC
  Administered 2016-03-03: 3.375 g via INTRAVENOUS
  Filled 2016-03-03: qty 50

## 2016-03-03 MED ORDER — PROPOFOL 10 MG/ML IV BOLUS
INTRAVENOUS | Status: DC | PRN
  Administered 2016-03-03 (×3): 20 mg via INTRAVENOUS

## 2016-03-03 NOTE — Anesthesia Preprocedure Evaluation (Addendum)
Anesthesia Evaluation  Patient identified by MRN, date of birth, ID band Patient awake    Reviewed: Allergy & Precautions, NPO status , Patient's Chart, lab work & pertinent test results  History of Anesthesia Complications Negative for: history of anesthetic complications  Airway Mallampati: II  TM Distance: >3 FB Neck ROM: Full    Dental  (+) Poor Dentition, Partial Upper   Pulmonary neg pulmonary ROS, neg sleep apnea, neg COPD,    breath sounds clear to auscultation- rhonchi (-) wheezing      Cardiovascular hypertension, Pt. on medications (-) CAD and (-) Past MI  Rhythm:Regular Rate:Normal - Systolic murmurs and - Diastolic murmurs    Neuro/Psych Depression Parkinson's disease    GI/Hepatic negative GI ROS, Neg liver ROS,   Endo/Other  diabetes (diet controlled), Type 2  Renal/GU negative Renal ROS     Musculoskeletal   Abdominal (+) - obese,   Peds  Hematology negative hematology ROS (+)   Anesthesia Other Findings Past Medical History: No date: Carotid artery occlusion No date: Dementia No date: Depression No date: Diabetes mellitus without complication (HCC) No date: Hypertension No date: PD (Parkinson's disease) (HCC) No date: Thyroid nodule   Reproductive/Obstetrics                            Anesthesia Physical Anesthesia Plan  ASA: II  Anesthesia Plan: General   Post-op Pain Management:    Induction: Intravenous  Airway Management Planned: Natural Airway  Additional Equipment:   Intra-op Plan:   Post-operative Plan:   Informed Consent: I have reviewed the patients History and Physical, chart, labs and discussed the procedure including the risks, benefits and alternatives for the proposed anesthesia with the patient or authorized representative who has indicated his/her understanding and acceptance.   Dental advisory given  Plan Discussed with: CRNA and  Anesthesiologist  Anesthesia Plan Comments:         Anesthesia Quick Evaluation

## 2016-03-03 NOTE — Anesthesia Postprocedure Evaluation (Signed)
Anesthesia Post Note  Patient: Vanessa Montes  Procedure(s) Performed: Procedure(s) (LRB): ESOPHAGOGASTRODUODENOSCOPY (EGD) WITH PROPOFOL (N/A)  Patient location during evaluation: Endoscopy Anesthesia Type: General Level of consciousness: awake and alert Pain management: pain level controlled Vital Signs Assessment: post-procedure vital signs reviewed and stable Respiratory status: spontaneous breathing, nonlabored ventilation, respiratory function stable and patient connected to nasal cannula oxygen Cardiovascular status: blood pressure returned to baseline and stable Postop Assessment: no signs of nausea or vomiting Anesthetic complications: no    Last Vitals:  Vitals:   03/03/16 1142 03/03/16 1202  BP: 123/75 (!) 161/57  Pulse: 86 91  Resp: 20 15  Temp:      Last Pain:  Vitals:   03/03/16 1132  TempSrc: Tympanic                 Brandt Chaney S

## 2016-03-03 NOTE — Transfer of Care (Signed)
Immediate Anesthesia Transfer of Care Note  Patient: Vanessa Montes  Procedure(s) Performed: Procedure(s) with comments: ESOPHAGOGASTRODUODENOSCOPY (EGD) WITH PROPOFOL (N/A) - w/ Dilation (+) Diabetes  Patient Location: PACU  Anesthesia Type:General  Level of Consciousness: sedated  Airway & Oxygen Therapy: Patient Spontanous Breathing and Patient connected to nasal cannula oxygen  Post-op Assessment: Report given to RN and Post -op Vital signs reviewed and stable  Post vital signs: Reviewed and stable  Last Vitals:  Vitals:   03/03/16 1131 03/03/16 1132  BP: (!) 156/54 (!) 187/70  Pulse: (!) 104 (!) 102  Resp: 20 16  Temp:  (!) 35.9 C    Last Pain:  Vitals:   03/03/16 1132  TempSrc: Tympanic         Complications: No apparent anesthesia complications

## 2016-03-03 NOTE — H&P (Signed)
Primary Care Physician:  Ronnell Freshwater, NP Primary Gastroenterologist:  Dr. Vira Agar  Pre-Procedure History & Physical: HPI:  Vanessa Montes is a 82 y.o. female is here for an endoscopy.   Past Medical History:  Diagnosis Date  . Carotid artery occlusion   . Dementia   . Depression   . Diabetes mellitus without complication ([place])   . Hypertension   . PD (Parkinson's disease) ([place])   . Thyroid nodule     Past Surgical History:  Procedure Laterality Date  . ABDOMINAL HYSTERECTOMY    . CAROTID ENDARTERECTOMY    . EYE SURGERY Right 11/09/2011  . JOINT REPLACEMENT Right 2014   knee    Prior to Admission medications   Medication Sig Start Date End Date Taking? Authorizing Provider  Artificial Tear Solution (TEARS NATURALE II) SOLN Apply 1 drop to eye every 4 (four) hours as needed.   Yes Historical Provider, MD  carbidopa-levodopa (SINEMET IR) 25-100 MG tablet Take 1 tablet by mouth 3 (three) times daily.   Yes Historical Provider, MD  cholecalciferol (VITAMIN D) 400 units TABS tablet Take 800 Units by mouth daily.   Yes Historical Provider, MD  clopidogrel (PLAVIX) 75 MG tablet Take 75 mg by mouth daily.   Yes Historical Provider, MD  dextrose (GLUTOSE) 40 % GEL Take 1 Tube by mouth once as needed for low blood sugar.   Yes Historical Provider, MD  docusate sodium (COLACE) 100 MG capsule Take 100 mg by mouth 2 (two) times daily.   Yes Historical Provider, MD  donepezil (ARICEPT) 5 MG tablet Take 5 mg by mouth at bedtime.   Yes Historical Provider, MD  escitalopram (LEXAPRO) 10 MG tablet Take 10 mg by mouth daily.   Yes Historical Provider, MD  Fe Cbn-Fe Gluc-FA-B12-C-DSS (FERRALET 90 PO) Take by mouth daily.   Yes Historical Provider, MD  guaiFENesin (MUCINEX) 600 MG 12 hr tablet Take 600 mg by mouth 2 (two) times daily.   Yes Historical Provider, MD  ipratropium (ATROVENT) 0.03 % nasal spray Place 2 sprays into both nostrils every 12 (twelve) hours.   Yes Historical  Provider, MD  levocetirizine (XYZAL) 5 MG tablet Take 5 mg by mouth every evening.   Yes Historical Provider, MD  olopatadine (PATANOL) 0.1 % ophthalmic solution Place 1 drop into both eyes 2 (two) times daily.   Yes Historical Provider, MD  pantoprazole (PROTONIX) 40 MG tablet Take 40 mg by mouth daily.   Yes Historical Provider, MD  traMADol (ULTRAM) 50 MG tablet Take 50 mg by mouth every 6 (six) hours as needed.   Yes Historical Provider, MD    Allergies as of 01/01/2016  . (Not on File)    History reviewed. No pertinent family history.  Social History   Social History  . Marital status: Widowed    Spouse name: N/A  . Number of children: N/A  . Years of education: N/A   Occupational History  . Not on file.   Social History Main Topics  . Smoking status: Never Smoker  . Smokeless tobacco: Never Used  . Alcohol use No  . Drug use: No  . Sexual activity: Not on file   Other Topics Concern  . Not on file   Social History Narrative  . No narrative on file    Review of Systems: See HPI, otherwise negative ROS  Physical Exam: BP (!) 155/39   Pulse 63   Temp 97 F (36.1 C) (Tympanic)   Resp 14  Ht 5\' 4"  (1.626 m)   Wt 49.9 kg (110 lb)   SpO2 97%   BMI 18.88 kg/m  General:   Alert,  pleasant and cooperative in NAD Head:  Normocephalic and atraumatic. Neck:  Supple; no masses or thyromegaly. Lungs:  Clear throughout to auscultation.    Heart:  Regular rate and rhythm. Abdomen:  Soft, nontender and nondistended. Normal bowel sounds, without guarding, and without rebound.   Neurologic:  Alert and  oriented x4;  grossly normal neurologically.  Impression/Plan: Vanessa Montes is here for an endoscopy to be performed for dysphagia and abnormal barium swallow.  Risks, benefits, limitations, and alternatives regarding  endoscopy have been reviewed with the patient.  Questions have been answered.  All parties agreeable.   Gaylyn Cheers, MD  03/03/2016, 11:14  AM

## 2016-03-03 NOTE — Op Note (Signed)
[facility] Gastroenterology Patient Name: Vanessa Montes Procedure Date: 03/03/2016 11:14 AM MRN: [id number] Account #: [id number] Date of Birth: May 28, 1930 Admit Type: Outpatient Age: 82 Room: [facility] ENDO ROOM 4 Gender: Female Note Status: Finalized Procedure:            Upper GI endoscopy Indications:          Dysphagia Providers:            Manya Silvas, MD Referring MD:         No Local Md, MD (Referring MD) Medicines:            Propofol per Anesthesia Complications:        No immediate complications. Procedure:            Pre-Anesthesia Assessment:                       - After reviewing the risks and benefits, the patient                        was deemed in satisfactory condition to undergo the                        procedure.                       After obtaining informed consent, the endoscope was                        passed under direct vision. Throughout the procedure,                        the patient's blood pressure, pulse, and oxygen                        saturations were monitored continuously. The Endoscope                        was introduced through the mouth, and advanced to the                        second part of duodenum. The upper GI endoscopy was                        accomplished without difficulty. The patient tolerated                        the procedure well. Findings:      A mild Schatzki ring (acquired) was found at the gastroesophageal       junction. A guidewire was placed and the scope was withdrawn. Dilation       was performed with a Savary dilator with mild resistance at 15 mm and       moderate resistance at 16 mm.      The stomach was otherwise normal.      The examined duodenum was normal. Impression:           - Mild Schatzki ring. Dilated.                       - Normal stomach.                       -  Normal examined duodenum.                       - No specimens collected. Recommendation:       -  soft food for 3 days, eat slowly, chew well, take                        small bites Manya Silvas, MD 03/03/2016 11:32:22 AM This report has been signed electronically. Number of Addenda: 0 Note Initiated On: 03/03/2016 11:14 AM      [facility]

## 2016-03-03 NOTE — Anesthesia Procedure Notes (Signed)
Performed by: Kal Chait Pre-anesthesia Checklist: Patient identified, Emergency Drugs available, Suction available, Patient being monitored and Timeout performed Patient Re-evaluated:Patient Re-evaluated prior to inductionOxygen Delivery Method: Nasal cannula Intubation Type: IV induction       

## 2016-03-05 NOTE — Progress Notes (Signed)
On post-op call, Daughter states that patient has been doing well since procedure.

## 2016-03-08 ENCOUNTER — Encounter: Payer: Self-pay | Admitting: Unknown Physician Specialty

## 2016-03-09 DIAGNOSIS — G20 Parkinson's disease: Secondary | ICD-10-CM | POA: Diagnosis not present

## 2016-03-09 DIAGNOSIS — M810 Age-related osteoporosis without current pathological fracture: Secondary | ICD-10-CM | POA: Diagnosis not present

## 2016-03-09 DIAGNOSIS — I10 Essential (primary) hypertension: Secondary | ICD-10-CM | POA: Diagnosis not present

## 2016-03-09 DIAGNOSIS — I6523 Occlusion and stenosis of bilateral carotid arteries: Secondary | ICD-10-CM | POA: Diagnosis not present

## 2016-03-09 DIAGNOSIS — E119 Type 2 diabetes mellitus without complications: Secondary | ICD-10-CM | POA: Diagnosis not present

## 2016-03-09 DIAGNOSIS — F0280 Dementia in other diseases classified elsewhere without behavioral disturbance: Secondary | ICD-10-CM | POA: Diagnosis not present

## 2016-03-11 DIAGNOSIS — F0390 Unspecified dementia without behavioral disturbance: Secondary | ICD-10-CM | POA: Diagnosis not present

## 2016-03-11 DIAGNOSIS — F329 Major depressive disorder, single episode, unspecified: Secondary | ICD-10-CM | POA: Diagnosis not present

## 2016-03-11 DIAGNOSIS — G20 Parkinson's disease: Secondary | ICD-10-CM | POA: Diagnosis not present

## 2016-03-11 DIAGNOSIS — K219 Gastro-esophageal reflux disease without esophagitis: Secondary | ICD-10-CM | POA: Diagnosis not present

## 2016-03-11 DIAGNOSIS — J302 Other seasonal allergic rhinitis: Secondary | ICD-10-CM | POA: Diagnosis not present

## 2016-03-12 DIAGNOSIS — M810 Age-related osteoporosis without current pathological fracture: Secondary | ICD-10-CM | POA: Diagnosis not present

## 2016-03-12 DIAGNOSIS — G20 Parkinson's disease: Secondary | ICD-10-CM | POA: Diagnosis not present

## 2016-03-12 DIAGNOSIS — F0280 Dementia in other diseases classified elsewhere without behavioral disturbance: Secondary | ICD-10-CM | POA: Diagnosis not present

## 2016-03-12 DIAGNOSIS — I10 Essential (primary) hypertension: Secondary | ICD-10-CM | POA: Diagnosis not present

## 2016-03-12 DIAGNOSIS — I6523 Occlusion and stenosis of bilateral carotid arteries: Secondary | ICD-10-CM | POA: Diagnosis not present

## 2016-03-12 DIAGNOSIS — E119 Type 2 diabetes mellitus without complications: Secondary | ICD-10-CM | POA: Diagnosis not present

## 2016-03-15 DIAGNOSIS — F0280 Dementia in other diseases classified elsewhere without behavioral disturbance: Secondary | ICD-10-CM | POA: Diagnosis not present

## 2016-03-15 DIAGNOSIS — G20 Parkinson's disease: Secondary | ICD-10-CM | POA: Diagnosis not present

## 2016-03-15 DIAGNOSIS — M810 Age-related osteoporosis without current pathological fracture: Secondary | ICD-10-CM | POA: Diagnosis not present

## 2016-03-15 DIAGNOSIS — I10 Essential (primary) hypertension: Secondary | ICD-10-CM | POA: Diagnosis not present

## 2016-03-15 DIAGNOSIS — I6523 Occlusion and stenosis of bilateral carotid arteries: Secondary | ICD-10-CM | POA: Diagnosis not present

## 2016-03-15 DIAGNOSIS — E119 Type 2 diabetes mellitus without complications: Secondary | ICD-10-CM | POA: Diagnosis not present

## 2016-03-17 DIAGNOSIS — Z79899 Other long term (current) drug therapy: Secondary | ICD-10-CM | POA: Diagnosis not present

## 2016-03-19 DIAGNOSIS — I6523 Occlusion and stenosis of bilateral carotid arteries: Secondary | ICD-10-CM | POA: Diagnosis not present

## 2016-03-19 DIAGNOSIS — G20 Parkinson's disease: Secondary | ICD-10-CM | POA: Diagnosis not present

## 2016-03-19 DIAGNOSIS — M810 Age-related osteoporosis without current pathological fracture: Secondary | ICD-10-CM | POA: Diagnosis not present

## 2016-03-19 DIAGNOSIS — F0280 Dementia in other diseases classified elsewhere without behavioral disturbance: Secondary | ICD-10-CM | POA: Diagnosis not present

## 2016-03-19 DIAGNOSIS — I10 Essential (primary) hypertension: Secondary | ICD-10-CM | POA: Diagnosis not present

## 2016-03-19 DIAGNOSIS — E119 Type 2 diabetes mellitus without complications: Secondary | ICD-10-CM | POA: Diagnosis not present

## 2016-03-22 DIAGNOSIS — F0280 Dementia in other diseases classified elsewhere without behavioral disturbance: Secondary | ICD-10-CM | POA: Diagnosis not present

## 2016-03-22 DIAGNOSIS — M810 Age-related osteoporosis without current pathological fracture: Secondary | ICD-10-CM | POA: Diagnosis not present

## 2016-03-22 DIAGNOSIS — E119 Type 2 diabetes mellitus without complications: Secondary | ICD-10-CM | POA: Diagnosis not present

## 2016-03-22 DIAGNOSIS — G20 Parkinson's disease: Secondary | ICD-10-CM | POA: Diagnosis not present

## 2016-03-22 DIAGNOSIS — I10 Essential (primary) hypertension: Secondary | ICD-10-CM | POA: Diagnosis not present

## 2016-03-22 DIAGNOSIS — I6523 Occlusion and stenosis of bilateral carotid arteries: Secondary | ICD-10-CM | POA: Diagnosis not present

## 2016-03-25 DIAGNOSIS — Z79899 Other long term (current) drug therapy: Secondary | ICD-10-CM | POA: Diagnosis not present

## 2016-03-25 DIAGNOSIS — R269 Unspecified abnormalities of gait and mobility: Secondary | ICD-10-CM | POA: Diagnosis not present

## 2016-03-25 DIAGNOSIS — G20 Parkinson's disease: Secondary | ICD-10-CM | POA: Diagnosis not present

## 2016-03-25 DIAGNOSIS — F329 Major depressive disorder, single episode, unspecified: Secondary | ICD-10-CM | POA: Diagnosis not present

## 2016-03-25 DIAGNOSIS — M25561 Pain in right knee: Secondary | ICD-10-CM | POA: Diagnosis not present

## 2016-03-25 DIAGNOSIS — F0281 Dementia in other diseases classified elsewhere with behavioral disturbance: Secondary | ICD-10-CM | POA: Diagnosis not present

## 2016-03-26 DIAGNOSIS — E119 Type 2 diabetes mellitus without complications: Secondary | ICD-10-CM | POA: Diagnosis not present

## 2016-03-26 DIAGNOSIS — M810 Age-related osteoporosis without current pathological fracture: Secondary | ICD-10-CM | POA: Diagnosis not present

## 2016-03-26 DIAGNOSIS — I10 Essential (primary) hypertension: Secondary | ICD-10-CM | POA: Diagnosis not present

## 2016-03-26 DIAGNOSIS — I6523 Occlusion and stenosis of bilateral carotid arteries: Secondary | ICD-10-CM | POA: Diagnosis not present

## 2016-03-26 DIAGNOSIS — F0280 Dementia in other diseases classified elsewhere without behavioral disturbance: Secondary | ICD-10-CM | POA: Diagnosis not present

## 2016-03-26 DIAGNOSIS — G20 Parkinson's disease: Secondary | ICD-10-CM | POA: Diagnosis not present

## 2016-03-29 DIAGNOSIS — E119 Type 2 diabetes mellitus without complications: Secondary | ICD-10-CM | POA: Diagnosis not present

## 2016-03-29 DIAGNOSIS — M810 Age-related osteoporosis without current pathological fracture: Secondary | ICD-10-CM | POA: Diagnosis not present

## 2016-03-29 DIAGNOSIS — I10 Essential (primary) hypertension: Secondary | ICD-10-CM | POA: Diagnosis not present

## 2016-03-29 DIAGNOSIS — G20 Parkinson's disease: Secondary | ICD-10-CM | POA: Diagnosis not present

## 2016-03-29 DIAGNOSIS — I6523 Occlusion and stenosis of bilateral carotid arteries: Secondary | ICD-10-CM | POA: Diagnosis not present

## 2016-03-29 DIAGNOSIS — F0280 Dementia in other diseases classified elsewhere without behavioral disturbance: Secondary | ICD-10-CM | POA: Diagnosis not present

## 2016-04-01 DIAGNOSIS — I10 Essential (primary) hypertension: Secondary | ICD-10-CM | POA: Diagnosis not present

## 2016-04-01 DIAGNOSIS — M810 Age-related osteoporosis without current pathological fracture: Secondary | ICD-10-CM | POA: Diagnosis not present

## 2016-04-01 DIAGNOSIS — E119 Type 2 diabetes mellitus without complications: Secondary | ICD-10-CM | POA: Diagnosis not present

## 2016-04-01 DIAGNOSIS — F0280 Dementia in other diseases classified elsewhere without behavioral disturbance: Secondary | ICD-10-CM | POA: Diagnosis not present

## 2016-04-01 DIAGNOSIS — I6523 Occlusion and stenosis of bilateral carotid arteries: Secondary | ICD-10-CM | POA: Diagnosis not present

## 2016-04-01 DIAGNOSIS — G20 Parkinson's disease: Secondary | ICD-10-CM | POA: Diagnosis not present

## 2016-04-07 DIAGNOSIS — I10 Essential (primary) hypertension: Secondary | ICD-10-CM | POA: Diagnosis not present

## 2016-04-07 DIAGNOSIS — F0280 Dementia in other diseases classified elsewhere without behavioral disturbance: Secondary | ICD-10-CM | POA: Diagnosis not present

## 2016-04-07 DIAGNOSIS — G20 Parkinson's disease: Secondary | ICD-10-CM | POA: Diagnosis not present

## 2016-04-07 DIAGNOSIS — E119 Type 2 diabetes mellitus without complications: Secondary | ICD-10-CM | POA: Diagnosis not present

## 2016-04-07 DIAGNOSIS — M810 Age-related osteoporosis without current pathological fracture: Secondary | ICD-10-CM | POA: Diagnosis not present

## 2016-04-07 DIAGNOSIS — I6523 Occlusion and stenosis of bilateral carotid arteries: Secondary | ICD-10-CM | POA: Diagnosis not present

## 2016-04-09 DIAGNOSIS — F0280 Dementia in other diseases classified elsewhere without behavioral disturbance: Secondary | ICD-10-CM | POA: Diagnosis not present

## 2016-04-09 DIAGNOSIS — I6523 Occlusion and stenosis of bilateral carotid arteries: Secondary | ICD-10-CM | POA: Diagnosis not present

## 2016-04-09 DIAGNOSIS — G20 Parkinson's disease: Secondary | ICD-10-CM | POA: Diagnosis not present

## 2016-04-09 DIAGNOSIS — I10 Essential (primary) hypertension: Secondary | ICD-10-CM | POA: Diagnosis not present

## 2016-04-09 DIAGNOSIS — E119 Type 2 diabetes mellitus without complications: Secondary | ICD-10-CM | POA: Diagnosis not present

## 2016-04-09 DIAGNOSIS — M810 Age-related osteoporosis without current pathological fracture: Secondary | ICD-10-CM | POA: Diagnosis not present

## 2016-04-14 DIAGNOSIS — G20 Parkinson's disease: Secondary | ICD-10-CM | POA: Diagnosis not present

## 2016-04-14 DIAGNOSIS — E119 Type 2 diabetes mellitus without complications: Secondary | ICD-10-CM | POA: Diagnosis not present

## 2016-04-14 DIAGNOSIS — I10 Essential (primary) hypertension: Secondary | ICD-10-CM | POA: Diagnosis not present

## 2016-04-14 DIAGNOSIS — M810 Age-related osteoporosis without current pathological fracture: Secondary | ICD-10-CM | POA: Diagnosis not present

## 2016-04-14 DIAGNOSIS — I6523 Occlusion and stenosis of bilateral carotid arteries: Secondary | ICD-10-CM | POA: Diagnosis not present

## 2016-04-14 DIAGNOSIS — F0280 Dementia in other diseases classified elsewhere without behavioral disturbance: Secondary | ICD-10-CM | POA: Diagnosis not present

## 2016-04-15 DIAGNOSIS — K5900 Constipation, unspecified: Secondary | ICD-10-CM | POA: Diagnosis not present

## 2016-04-15 DIAGNOSIS — Z79899 Other long term (current) drug therapy: Secondary | ICD-10-CM | POA: Diagnosis not present

## 2016-04-15 DIAGNOSIS — F329 Major depressive disorder, single episode, unspecified: Secondary | ICD-10-CM | POA: Diagnosis not present

## 2016-04-15 DIAGNOSIS — M545 Low back pain: Secondary | ICD-10-CM | POA: Diagnosis not present

## 2016-04-15 DIAGNOSIS — G309 Alzheimer's disease, unspecified: Secondary | ICD-10-CM | POA: Diagnosis not present

## 2016-04-15 DIAGNOSIS — R109 Unspecified abdominal pain: Secondary | ICD-10-CM | POA: Diagnosis not present

## 2016-04-15 DIAGNOSIS — G20 Parkinson's disease: Secondary | ICD-10-CM | POA: Diagnosis not present

## 2016-04-15 DIAGNOSIS — R269 Unspecified abnormalities of gait and mobility: Secondary | ICD-10-CM | POA: Diagnosis not present

## 2016-04-16 DIAGNOSIS — G20 Parkinson's disease: Secondary | ICD-10-CM | POA: Diagnosis not present

## 2016-04-16 DIAGNOSIS — I10 Essential (primary) hypertension: Secondary | ICD-10-CM | POA: Diagnosis not present

## 2016-04-16 DIAGNOSIS — F0280 Dementia in other diseases classified elsewhere without behavioral disturbance: Secondary | ICD-10-CM | POA: Diagnosis not present

## 2016-04-16 DIAGNOSIS — E119 Type 2 diabetes mellitus without complications: Secondary | ICD-10-CM | POA: Diagnosis not present

## 2016-04-16 DIAGNOSIS — I6523 Occlusion and stenosis of bilateral carotid arteries: Secondary | ICD-10-CM | POA: Diagnosis not present

## 2016-04-16 DIAGNOSIS — M810 Age-related osteoporosis without current pathological fracture: Secondary | ICD-10-CM | POA: Diagnosis not present

## 2016-04-19 DIAGNOSIS — I10 Essential (primary) hypertension: Secondary | ICD-10-CM | POA: Diagnosis not present

## 2016-04-19 DIAGNOSIS — M545 Low back pain: Secondary | ICD-10-CM | POA: Diagnosis not present

## 2016-04-19 DIAGNOSIS — M810 Age-related osteoporosis without current pathological fracture: Secondary | ICD-10-CM | POA: Diagnosis not present

## 2016-04-19 DIAGNOSIS — R1084 Generalized abdominal pain: Secondary | ICD-10-CM | POA: Diagnosis not present

## 2016-04-19 DIAGNOSIS — I6523 Occlusion and stenosis of bilateral carotid arteries: Secondary | ICD-10-CM | POA: Diagnosis not present

## 2016-04-19 DIAGNOSIS — E119 Type 2 diabetes mellitus without complications: Secondary | ICD-10-CM | POA: Diagnosis not present

## 2016-04-19 DIAGNOSIS — G20 Parkinson's disease: Secondary | ICD-10-CM | POA: Diagnosis not present

## 2016-04-19 DIAGNOSIS — F0280 Dementia in other diseases classified elsewhere without behavioral disturbance: Secondary | ICD-10-CM | POA: Diagnosis not present

## 2016-04-20 DIAGNOSIS — G20 Parkinson's disease: Secondary | ICD-10-CM | POA: Diagnosis not present

## 2016-04-20 DIAGNOSIS — R030 Elevated blood-pressure reading, without diagnosis of hypertension: Secondary | ICD-10-CM | POA: Diagnosis not present

## 2016-04-20 DIAGNOSIS — R7302 Impaired glucose tolerance (oral): Secondary | ICD-10-CM | POA: Diagnosis not present

## 2016-04-20 DIAGNOSIS — F329 Major depressive disorder, single episode, unspecified: Secondary | ICD-10-CM | POA: Diagnosis not present

## 2016-04-22 DIAGNOSIS — I10 Essential (primary) hypertension: Secondary | ICD-10-CM | POA: Diagnosis not present

## 2016-04-22 DIAGNOSIS — I6523 Occlusion and stenosis of bilateral carotid arteries: Secondary | ICD-10-CM | POA: Diagnosis not present

## 2016-04-22 DIAGNOSIS — E119 Type 2 diabetes mellitus without complications: Secondary | ICD-10-CM | POA: Diagnosis not present

## 2016-04-22 DIAGNOSIS — G20 Parkinson's disease: Secondary | ICD-10-CM | POA: Diagnosis not present

## 2016-04-22 DIAGNOSIS — M810 Age-related osteoporosis without current pathological fracture: Secondary | ICD-10-CM | POA: Diagnosis not present

## 2016-04-22 DIAGNOSIS — F0280 Dementia in other diseases classified elsewhere without behavioral disturbance: Secondary | ICD-10-CM | POA: Diagnosis not present

## 2016-04-26 ENCOUNTER — Other Ambulatory Visit: Payer: Self-pay | Admitting: Neurology

## 2016-04-26 DIAGNOSIS — G20 Parkinson's disease: Secondary | ICD-10-CM | POA: Diagnosis not present

## 2016-04-26 DIAGNOSIS — R1310 Dysphagia, unspecified: Secondary | ICD-10-CM | POA: Diagnosis not present

## 2016-04-27 DIAGNOSIS — Z79899 Other long term (current) drug therapy: Secondary | ICD-10-CM | POA: Diagnosis not present

## 2016-04-29 DIAGNOSIS — E119 Type 2 diabetes mellitus without complications: Secondary | ICD-10-CM | POA: Diagnosis not present

## 2016-04-29 DIAGNOSIS — I6523 Occlusion and stenosis of bilateral carotid arteries: Secondary | ICD-10-CM | POA: Diagnosis not present

## 2016-04-29 DIAGNOSIS — G20 Parkinson's disease: Secondary | ICD-10-CM | POA: Diagnosis not present

## 2016-04-29 DIAGNOSIS — F0280 Dementia in other diseases classified elsewhere without behavioral disturbance: Secondary | ICD-10-CM | POA: Diagnosis not present

## 2016-04-29 DIAGNOSIS — I10 Essential (primary) hypertension: Secondary | ICD-10-CM | POA: Diagnosis not present

## 2016-04-29 DIAGNOSIS — M810 Age-related osteoporosis without current pathological fracture: Secondary | ICD-10-CM | POA: Diagnosis not present

## 2016-04-30 DIAGNOSIS — E119 Type 2 diabetes mellitus without complications: Secondary | ICD-10-CM | POA: Diagnosis not present

## 2016-04-30 DIAGNOSIS — G20 Parkinson's disease: Secondary | ICD-10-CM | POA: Diagnosis not present

## 2016-04-30 DIAGNOSIS — I6523 Occlusion and stenosis of bilateral carotid arteries: Secondary | ICD-10-CM | POA: Diagnosis not present

## 2016-04-30 DIAGNOSIS — M810 Age-related osteoporosis without current pathological fracture: Secondary | ICD-10-CM | POA: Diagnosis not present

## 2016-04-30 DIAGNOSIS — I10 Essential (primary) hypertension: Secondary | ICD-10-CM | POA: Diagnosis not present

## 2016-04-30 DIAGNOSIS — F0280 Dementia in other diseases classified elsewhere without behavioral disturbance: Secondary | ICD-10-CM | POA: Diagnosis not present

## 2016-05-03 DIAGNOSIS — R262 Difficulty in walking, not elsewhere classified: Secondary | ICD-10-CM | POA: Diagnosis not present

## 2016-05-03 DIAGNOSIS — M79673 Pain in unspecified foot: Secondary | ICD-10-CM | POA: Diagnosis not present

## 2016-05-03 DIAGNOSIS — B351 Tinea unguium: Secondary | ICD-10-CM | POA: Diagnosis not present

## 2016-05-03 DIAGNOSIS — L600 Ingrowing nail: Secondary | ICD-10-CM | POA: Diagnosis not present

## 2016-05-04 DIAGNOSIS — R030 Elevated blood-pressure reading, without diagnosis of hypertension: Secondary | ICD-10-CM | POA: Diagnosis not present

## 2016-05-04 DIAGNOSIS — R197 Diarrhea, unspecified: Secondary | ICD-10-CM | POA: Diagnosis not present

## 2016-05-04 DIAGNOSIS — R7302 Impaired glucose tolerance (oral): Secondary | ICD-10-CM | POA: Diagnosis not present

## 2016-05-04 DIAGNOSIS — G20 Parkinson's disease: Secondary | ICD-10-CM | POA: Diagnosis not present

## 2016-05-04 DIAGNOSIS — R269 Unspecified abnormalities of gait and mobility: Secondary | ICD-10-CM | POA: Diagnosis not present

## 2016-05-04 DIAGNOSIS — F329 Major depressive disorder, single episode, unspecified: Secondary | ICD-10-CM | POA: Diagnosis not present

## 2016-05-05 DIAGNOSIS — R300 Dysuria: Secondary | ICD-10-CM | POA: Diagnosis not present

## 2016-05-05 DIAGNOSIS — R197 Diarrhea, unspecified: Secondary | ICD-10-CM | POA: Diagnosis not present

## 2016-05-07 DIAGNOSIS — I10 Essential (primary) hypertension: Secondary | ICD-10-CM | POA: Diagnosis not present

## 2016-05-07 DIAGNOSIS — G20 Parkinson's disease: Secondary | ICD-10-CM | POA: Diagnosis not present

## 2016-05-07 DIAGNOSIS — I6523 Occlusion and stenosis of bilateral carotid arteries: Secondary | ICD-10-CM | POA: Diagnosis not present

## 2016-05-07 DIAGNOSIS — E119 Type 2 diabetes mellitus without complications: Secondary | ICD-10-CM | POA: Diagnosis not present

## 2016-05-07 DIAGNOSIS — F0280 Dementia in other diseases classified elsewhere without behavioral disturbance: Secondary | ICD-10-CM | POA: Diagnosis not present

## 2016-05-07 DIAGNOSIS — M810 Age-related osteoporosis without current pathological fracture: Secondary | ICD-10-CM | POA: Diagnosis not present

## 2016-05-10 DIAGNOSIS — G20 Parkinson's disease: Secondary | ICD-10-CM | POA: Diagnosis not present

## 2016-05-10 DIAGNOSIS — F0280 Dementia in other diseases classified elsewhere without behavioral disturbance: Secondary | ICD-10-CM | POA: Diagnosis not present

## 2016-05-10 DIAGNOSIS — M810 Age-related osteoporosis without current pathological fracture: Secondary | ICD-10-CM | POA: Diagnosis not present

## 2016-05-10 DIAGNOSIS — E119 Type 2 diabetes mellitus without complications: Secondary | ICD-10-CM | POA: Diagnosis not present

## 2016-05-10 DIAGNOSIS — I10 Essential (primary) hypertension: Secondary | ICD-10-CM | POA: Diagnosis not present

## 2016-05-10 DIAGNOSIS — I6523 Occlusion and stenosis of bilateral carotid arteries: Secondary | ICD-10-CM | POA: Diagnosis not present

## 2016-05-11 DIAGNOSIS — I6523 Occlusion and stenosis of bilateral carotid arteries: Secondary | ICD-10-CM | POA: Diagnosis not present

## 2016-05-11 DIAGNOSIS — M810 Age-related osteoporosis without current pathological fracture: Secondary | ICD-10-CM | POA: Diagnosis not present

## 2016-05-11 DIAGNOSIS — F0280 Dementia in other diseases classified elsewhere without behavioral disturbance: Secondary | ICD-10-CM | POA: Diagnosis not present

## 2016-05-11 DIAGNOSIS — Z79899 Other long term (current) drug therapy: Secondary | ICD-10-CM | POA: Diagnosis not present

## 2016-05-11 DIAGNOSIS — I10 Essential (primary) hypertension: Secondary | ICD-10-CM | POA: Diagnosis not present

## 2016-05-11 DIAGNOSIS — F329 Major depressive disorder, single episode, unspecified: Secondary | ICD-10-CM | POA: Diagnosis not present

## 2016-05-11 DIAGNOSIS — R269 Unspecified abnormalities of gait and mobility: Secondary | ICD-10-CM | POA: Diagnosis not present

## 2016-05-11 DIAGNOSIS — N390 Urinary tract infection, site not specified: Secondary | ICD-10-CM | POA: Diagnosis not present

## 2016-05-11 DIAGNOSIS — G20 Parkinson's disease: Secondary | ICD-10-CM | POA: Diagnosis not present

## 2016-05-11 DIAGNOSIS — E119 Type 2 diabetes mellitus without complications: Secondary | ICD-10-CM | POA: Diagnosis not present

## 2016-05-12 DIAGNOSIS — E042 Nontoxic multinodular goiter: Secondary | ICD-10-CM | POA: Diagnosis not present

## 2016-05-12 DIAGNOSIS — E892 Postprocedural hypoparathyroidism: Secondary | ICD-10-CM | POA: Diagnosis not present

## 2016-05-13 ENCOUNTER — Ambulatory Visit
Admission: RE | Admit: 2016-05-13 | Discharge: 2016-05-13 | Disposition: A | Discharge status: Home / Self Care | Payer: Medicare Other | Source: Ambulatory Visit | Attending: Neurology | Admitting: Neurology

## 2016-05-13 DIAGNOSIS — I10 Essential (primary) hypertension: Secondary | ICD-10-CM | POA: Diagnosis not present

## 2016-05-13 DIAGNOSIS — I6523 Occlusion and stenosis of bilateral carotid arteries: Secondary | ICD-10-CM | POA: Diagnosis not present

## 2016-05-13 DIAGNOSIS — M47892 Other spondylosis, cervical region: Secondary | ICD-10-CM | POA: Diagnosis not present

## 2016-05-13 DIAGNOSIS — R1310 Dysphagia, unspecified: Secondary | ICD-10-CM

## 2016-05-13 DIAGNOSIS — E119 Type 2 diabetes mellitus without complications: Secondary | ICD-10-CM | POA: Diagnosis not present

## 2016-05-13 DIAGNOSIS — G20 Parkinson's disease: Secondary | ICD-10-CM | POA: Diagnosis not present

## 2016-05-13 DIAGNOSIS — M4802 Spinal stenosis, cervical region: Secondary | ICD-10-CM | POA: Diagnosis not present

## 2016-05-13 DIAGNOSIS — F0280 Dementia in other diseases classified elsewhere without behavioral disturbance: Secondary | ICD-10-CM | POA: Diagnosis not present

## 2016-05-13 DIAGNOSIS — M810 Age-related osteoporosis without current pathological fracture: Secondary | ICD-10-CM | POA: Diagnosis not present

## 2016-05-13 DIAGNOSIS — R4781 Slurred speech: Secondary | ICD-10-CM | POA: Diagnosis not present

## 2016-05-13 DIAGNOSIS — R51 Headache: Secondary | ICD-10-CM | POA: Diagnosis not present

## 2016-05-14 DIAGNOSIS — N390 Urinary tract infection, site not specified: Secondary | ICD-10-CM | POA: Diagnosis not present

## 2016-05-14 DIAGNOSIS — L89322 Pressure ulcer of left buttock, stage 2: Secondary | ICD-10-CM | POA: Diagnosis not present

## 2016-05-14 DIAGNOSIS — I10 Essential (primary) hypertension: Secondary | ICD-10-CM | POA: Diagnosis not present

## 2016-05-14 DIAGNOSIS — F0280 Dementia in other diseases classified elsewhere without behavioral disturbance: Secondary | ICD-10-CM | POA: Diagnosis not present

## 2016-05-14 DIAGNOSIS — G20 Parkinson's disease: Secondary | ICD-10-CM | POA: Diagnosis not present

## 2016-05-14 DIAGNOSIS — M6281 Muscle weakness (generalized): Secondary | ICD-10-CM | POA: Diagnosis not present

## 2016-05-17 DIAGNOSIS — G20 Parkinson's disease: Secondary | ICD-10-CM | POA: Diagnosis not present

## 2016-05-17 DIAGNOSIS — M6281 Muscle weakness (generalized): Secondary | ICD-10-CM | POA: Diagnosis not present

## 2016-05-17 DIAGNOSIS — F0280 Dementia in other diseases classified elsewhere without behavioral disturbance: Secondary | ICD-10-CM | POA: Diagnosis not present

## 2016-05-17 DIAGNOSIS — I10 Essential (primary) hypertension: Secondary | ICD-10-CM | POA: Diagnosis not present

## 2016-05-17 DIAGNOSIS — L89322 Pressure ulcer of left buttock, stage 2: Secondary | ICD-10-CM | POA: Diagnosis not present

## 2016-05-17 DIAGNOSIS — N390 Urinary tract infection, site not specified: Secondary | ICD-10-CM | POA: Diagnosis not present

## 2016-05-18 DIAGNOSIS — G20 Parkinson's disease: Secondary | ICD-10-CM | POA: Diagnosis not present

## 2016-05-18 DIAGNOSIS — R5381 Other malaise: Secondary | ICD-10-CM | POA: Diagnosis not present

## 2016-05-18 DIAGNOSIS — F0280 Dementia in other diseases classified elsewhere without behavioral disturbance: Secondary | ICD-10-CM | POA: Diagnosis not present

## 2016-05-18 DIAGNOSIS — R05 Cough: Secondary | ICD-10-CM | POA: Diagnosis not present

## 2016-05-18 DIAGNOSIS — I10 Essential (primary) hypertension: Secondary | ICD-10-CM | POA: Diagnosis not present

## 2016-05-18 DIAGNOSIS — J988 Other specified respiratory disorders: Secondary | ICD-10-CM | POA: Diagnosis not present

## 2016-05-18 DIAGNOSIS — R0600 Dyspnea, unspecified: Secondary | ICD-10-CM | POA: Diagnosis not present

## 2016-05-18 DIAGNOSIS — N390 Urinary tract infection, site not specified: Secondary | ICD-10-CM | POA: Diagnosis not present

## 2016-05-18 DIAGNOSIS — R269 Unspecified abnormalities of gait and mobility: Secondary | ICD-10-CM | POA: Diagnosis not present

## 2016-05-18 DIAGNOSIS — F329 Major depressive disorder, single episode, unspecified: Secondary | ICD-10-CM | POA: Diagnosis not present

## 2016-05-18 DIAGNOSIS — L89322 Pressure ulcer of left buttock, stage 2: Secondary | ICD-10-CM | POA: Diagnosis not present

## 2016-05-18 DIAGNOSIS — M6281 Muscle weakness (generalized): Secondary | ICD-10-CM | POA: Diagnosis not present

## 2016-05-18 DIAGNOSIS — R1310 Dysphagia, unspecified: Secondary | ICD-10-CM | POA: Diagnosis not present

## 2016-05-18 DIAGNOSIS — R0689 Other abnormalities of breathing: Secondary | ICD-10-CM | POA: Diagnosis not present

## 2016-05-19 DIAGNOSIS — N390 Urinary tract infection, site not specified: Secondary | ICD-10-CM | POA: Diagnosis not present

## 2016-05-19 DIAGNOSIS — F0280 Dementia in other diseases classified elsewhere without behavioral disturbance: Secondary | ICD-10-CM | POA: Diagnosis not present

## 2016-05-19 DIAGNOSIS — M6281 Muscle weakness (generalized): Secondary | ICD-10-CM | POA: Diagnosis not present

## 2016-05-19 DIAGNOSIS — L89322 Pressure ulcer of left buttock, stage 2: Secondary | ICD-10-CM | POA: Diagnosis not present

## 2016-05-19 DIAGNOSIS — G20 Parkinson's disease: Secondary | ICD-10-CM | POA: Diagnosis not present

## 2016-05-19 DIAGNOSIS — I10 Essential (primary) hypertension: Secondary | ICD-10-CM | POA: Diagnosis not present

## 2016-05-20 DIAGNOSIS — I10 Essential (primary) hypertension: Secondary | ICD-10-CM | POA: Diagnosis not present

## 2016-05-20 DIAGNOSIS — N390 Urinary tract infection, site not specified: Secondary | ICD-10-CM | POA: Diagnosis not present

## 2016-05-20 DIAGNOSIS — F0280 Dementia in other diseases classified elsewhere without behavioral disturbance: Secondary | ICD-10-CM | POA: Diagnosis not present

## 2016-05-20 DIAGNOSIS — M6281 Muscle weakness (generalized): Secondary | ICD-10-CM | POA: Diagnosis not present

## 2016-05-20 DIAGNOSIS — L89322 Pressure ulcer of left buttock, stage 2: Secondary | ICD-10-CM | POA: Diagnosis not present

## 2016-05-20 DIAGNOSIS — G20 Parkinson's disease: Secondary | ICD-10-CM | POA: Diagnosis not present

## 2016-05-24 DIAGNOSIS — L89322 Pressure ulcer of left buttock, stage 2: Secondary | ICD-10-CM | POA: Diagnosis not present

## 2016-05-24 DIAGNOSIS — G20 Parkinson's disease: Secondary | ICD-10-CM | POA: Diagnosis not present

## 2016-05-24 DIAGNOSIS — F0280 Dementia in other diseases classified elsewhere without behavioral disturbance: Secondary | ICD-10-CM | POA: Diagnosis not present

## 2016-05-24 DIAGNOSIS — N390 Urinary tract infection, site not specified: Secondary | ICD-10-CM | POA: Diagnosis not present

## 2016-05-24 DIAGNOSIS — I10 Essential (primary) hypertension: Secondary | ICD-10-CM | POA: Diagnosis not present

## 2016-05-24 DIAGNOSIS — M6281 Muscle weakness (generalized): Secondary | ICD-10-CM | POA: Diagnosis not present

## 2016-05-25 DIAGNOSIS — M25362 Other instability, left knee: Secondary | ICD-10-CM | POA: Diagnosis not present

## 2016-05-25 DIAGNOSIS — F329 Major depressive disorder, single episode, unspecified: Secondary | ICD-10-CM | POA: Diagnosis not present

## 2016-05-25 DIAGNOSIS — Z79899 Other long term (current) drug therapy: Secondary | ICD-10-CM | POA: Diagnosis not present

## 2016-05-25 DIAGNOSIS — M1991 Primary osteoarthritis, unspecified site: Secondary | ICD-10-CM | POA: Diagnosis not present

## 2016-05-25 DIAGNOSIS — I10 Essential (primary) hypertension: Secondary | ICD-10-CM | POA: Diagnosis not present

## 2016-05-25 DIAGNOSIS — G20 Parkinson's disease: Secondary | ICD-10-CM | POA: Diagnosis not present

## 2016-05-25 DIAGNOSIS — M25562 Pain in left knee: Secondary | ICD-10-CM | POA: Diagnosis not present

## 2016-05-25 DIAGNOSIS — R269 Unspecified abnormalities of gait and mobility: Secondary | ICD-10-CM | POA: Diagnosis not present

## 2016-05-27 ENCOUNTER — Encounter: Payer: Self-pay | Admitting: Emergency Medicine

## 2016-05-27 ENCOUNTER — Emergency Department: Payer: Medicare Other

## 2016-05-27 ENCOUNTER — Emergency Department
Admission: EM | Admit: 2016-05-27 | Discharge: 2016-05-27 | Disposition: A | Discharge status: Home / Self Care | Payer: Medicare Other | Attending: Emergency Medicine | Admitting: Emergency Medicine

## 2016-05-27 DIAGNOSIS — F0280 Dementia in other diseases classified elsewhere without behavioral disturbance: Secondary | ICD-10-CM | POA: Diagnosis not present

## 2016-05-27 DIAGNOSIS — R079 Chest pain, unspecified: Secondary | ICD-10-CM | POA: Diagnosis not present

## 2016-05-27 DIAGNOSIS — F329 Major depressive disorder, single episode, unspecified: Secondary | ICD-10-CM | POA: Diagnosis not present

## 2016-05-27 DIAGNOSIS — M25551 Pain in right hip: Secondary | ICD-10-CM | POA: Insufficient documentation

## 2016-05-27 DIAGNOSIS — Z79899 Other long term (current) drug therapy: Secondary | ICD-10-CM | POA: Diagnosis not present

## 2016-05-27 DIAGNOSIS — W1809XA Striking against other object with subsequent fall, initial encounter: Secondary | ICD-10-CM | POA: Insufficient documentation

## 2016-05-27 DIAGNOSIS — Y92129 Unspecified place in nursing home as the place of occurrence of the external cause: Secondary | ICD-10-CM | POA: Insufficient documentation

## 2016-05-27 DIAGNOSIS — R911 Solitary pulmonary nodule: Secondary | ICD-10-CM | POA: Diagnosis not present

## 2016-05-27 DIAGNOSIS — Y999 Unspecified external cause status: Secondary | ICD-10-CM | POA: Diagnosis not present

## 2016-05-27 DIAGNOSIS — N390 Urinary tract infection, site not specified: Secondary | ICD-10-CM | POA: Diagnosis not present

## 2016-05-27 DIAGNOSIS — R269 Unspecified abnormalities of gait and mobility: Secondary | ICD-10-CM | POA: Diagnosis not present

## 2016-05-27 DIAGNOSIS — G20 Parkinson's disease: Secondary | ICD-10-CM | POA: Diagnosis not present

## 2016-05-27 DIAGNOSIS — M542 Cervicalgia: Secondary | ICD-10-CM | POA: Diagnosis not present

## 2016-05-27 DIAGNOSIS — S0990XA Unspecified injury of head, initial encounter: Secondary | ICD-10-CM | POA: Diagnosis not present

## 2016-05-27 DIAGNOSIS — E119 Type 2 diabetes mellitus without complications: Secondary | ICD-10-CM | POA: Diagnosis not present

## 2016-05-27 DIAGNOSIS — M545 Low back pain: Secondary | ICD-10-CM | POA: Diagnosis not present

## 2016-05-27 DIAGNOSIS — G309 Alzheimer's disease, unspecified: Secondary | ICD-10-CM | POA: Diagnosis not present

## 2016-05-27 DIAGNOSIS — W19XXXA Unspecified fall, initial encounter: Secondary | ICD-10-CM

## 2016-05-27 DIAGNOSIS — R109 Unspecified abdominal pain: Secondary | ICD-10-CM | POA: Diagnosis not present

## 2016-05-27 DIAGNOSIS — Y939 Activity, unspecified: Secondary | ICD-10-CM | POA: Insufficient documentation

## 2016-05-27 DIAGNOSIS — L89322 Pressure ulcer of left buttock, stage 2: Secondary | ICD-10-CM | POA: Diagnosis not present

## 2016-05-27 DIAGNOSIS — S79911A Unspecified injury of right hip, initial encounter: Secondary | ICD-10-CM | POA: Diagnosis not present

## 2016-05-27 DIAGNOSIS — M6281 Muscle weakness (generalized): Secondary | ICD-10-CM | POA: Diagnosis not present

## 2016-05-27 DIAGNOSIS — S199XXA Unspecified injury of neck, initial encounter: Secondary | ICD-10-CM | POA: Diagnosis not present

## 2016-05-27 DIAGNOSIS — I10 Essential (primary) hypertension: Secondary | ICD-10-CM | POA: Diagnosis not present

## 2016-05-27 DIAGNOSIS — S299XXA Unspecified injury of thorax, initial encounter: Secondary | ICD-10-CM | POA: Diagnosis not present

## 2016-05-27 DIAGNOSIS — K5900 Constipation, unspecified: Secondary | ICD-10-CM | POA: Diagnosis not present

## 2016-05-27 LAB — CBC WITH DIFFERENTIAL/PLATELET
Basophils Absolute: 0.1 10*3/uL (ref 0–0.1)
Basophils Relative: 1 %
EOS ABS: 0.2 10*3/uL (ref 0–0.7)
EOS PCT: 1 %
HCT: 33 % — ABNORMAL LOW (ref 35.0–47.0)
Hemoglobin: 11.4 g/dL — ABNORMAL LOW (ref 12.0–16.0)
LYMPHS ABS: 1.8 10*3/uL (ref 1.0–3.6)
LYMPHS PCT: 17 %
MCH: 31.5 pg (ref 26.0–34.0)
MCHC: 34.5 g/dL (ref 32.0–36.0)
MCV: 91.4 fL (ref 80.0–100.0)
MONO ABS: 1 10*3/uL — AB (ref 0.2–0.9)
MONOS PCT: 9 %
Neutro Abs: 7.5 10*3/uL — ABNORMAL HIGH (ref 1.4–6.5)
Neutrophils Relative %: 72 %
PLATELETS: 244 10*3/uL (ref 150–440)
RBC: 3.61 MIL/uL — AB (ref 3.80–5.20)
RDW: 13.6 % (ref 11.5–14.5)
WBC: 10.5 10*3/uL (ref 3.6–11.0)

## 2016-05-27 LAB — BASIC METABOLIC PANEL
Anion gap: 7 (ref 5–15)
BUN: 22 mg/dL — ABNORMAL HIGH (ref 6–20)
CO2: 26 mmol/L (ref 22–32)
CREATININE: 0.74 mg/dL (ref 0.44–1.00)
Calcium: 10.8 mg/dL — ABNORMAL HIGH (ref 8.9–10.3)
Chloride: 106 mmol/L (ref 101–111)
GFR calc Af Amer: >60 mL/min (ref >60)
GLUCOSE: 131 mg/dL — AB (ref 65–99)
POTASSIUM: 3.3 mmol/L — AB (ref 3.5–5.1)
Sodium: 139 mmol/L (ref 135–145)

## 2016-05-27 LAB — URINALYSIS, COMPLETE (UACMP) WITH MICROSCOPIC
Bacteria, UA: NONE SEEN
Glucose, UA: NEGATIVE mg/dL
Hgb urine dipstick: NEGATIVE
Ketones, ur: 5 mg/dL — AB
Nitrite: NEGATIVE
PROTEIN: 100 mg/dL — AB
SPECIFIC GRAVITY, URINE: 1.028 (ref 1.005–1.030)
pH: 5 (ref 5.0–8.0)

## 2016-05-27 MED ORDER — TRAMADOL HCL 50 MG PO TABS
50.0000 mg | ORAL_TABLET | Freq: Once | ORAL | Status: AC
  Administered 2016-05-27: 50 mg via ORAL
  Filled 2016-05-27: qty 1

## 2016-05-27 NOTE — ED Notes (Signed)
Spoke to Vanessa Montes at [facility] in regards to patient being ready to be discharged. States there is someone that can come and pick up the patient but it will be at 1730. Family and patient made aware.

## 2016-05-27 NOTE — ED Notes (Signed)
Patient ambulated to commode with one assist.

## 2016-05-27 NOTE — ED Notes (Signed)
Patient able to bear weight.

## 2016-05-27 NOTE — ED Provider Notes (Signed)
[facility] Provider Note   CSN: [id number] Arrival date & time: 05/27/16  1240     History   Chief Complaint Chief Complaint  Patient presents with  . Fall    HPI Vanessa Montes is a 82 y.o. female hx of dementia, depression, DM, here with fall. Patient is from [facility]. Patient states that Her right leg gave out and she fell and hit her head on the floor. She also did hit her hips abs well. The nursing home called EMS she came here for evaluation. Does have frequent falls but denies any syncope or chest pain this time. Denies being on blood thinners.   The history is provided by the patient.   Level V caveat- dementia   Past Medical History:  Diagnosis Date  . Anemia   . Carotid artery occlusion   . Dementia   . Depression   . Diabetes mellitus without complication ([place])   . GERD (gastroesophageal reflux disease)   . HLD (hyperlipidemia)   . Hypertension   . Parkinson's disease ([place])   . PD (Parkinson's disease) ([place])   . Thyroid nodule     There are no active problems to display for this patient.   Past Surgical History:  Procedure Laterality Date  . ABDOMINAL HYSTERECTOMY    . APPENDECTOMY    . CAROTID ENDARTERECTOMY    . ESOPHAGOGASTRODUODENOSCOPY (EGD) WITH PROPOFOL N/A 03/03/2016   Procedure: ESOPHAGOGASTRODUODENOSCOPY (EGD) WITH PROPOFOL;  Surgeon: Manya Silvas, MD;  Location: [facility] ENDOSCOPY;  Service: Endoscopy;  Laterality: N/A;  w/ Dilation (+) Diabetes  . EYE SURGERY Right 11/09/2011  . JOINT REPLACEMENT    . JOINT REPLACEMENT Right 2014   knee  . PARATHYROIDECTOMY      OB History    No data available       Home Medications    Prior to Admission medications   Medication Sig Start Date End Date Taking? Authorizing Provider  acetaminophen (TYLENOL) 500 MG tablet Take 500 mg by mouth every 6 (six) hours as needed.   Yes Historical Provider, MD  amLODipine (NORVASC) 5 MG tablet Take 5 mg by mouth daily.    Yes Historical Provider, MD  Artificial Tear Solution (TEARS NATURALE II) SOLN Apply 1 drop to eye every 4 (four) hours as needed.   Yes Historical Provider, MD  azelastine (OPTIVAR) 0.05 % ophthalmic solution 1 drop 2 (two) times daily.   Yes Historical Provider, MD  carbidopa-levodopa (SINEMET IR) 25-100 MG tablet Take 1 tablet by mouth 4 (four) times daily.    Yes Historical Provider, MD  cholecalciferol (VITAMIN D) 400 units TABS tablet Take 400 Units by mouth daily.    Yes Historical Provider, MD  clopidogrel (PLAVIX) 75 MG tablet Take 75 mg by mouth daily.   Yes Historical Provider, MD  clotrimazole-betamethasone (LOTRISONE) cream Apply 1 application topically 2 (two) times daily.   Yes Historical Provider, MD  dextrose (GLUTOSE) 40 % GEL Take 1 Tube by mouth once as needed for low blood sugar.   Yes Historical Provider, MD  docusate sodium (COLACE) 100 MG capsule Take 100 mg by mouth daily.    Yes Historical Provider, MD  donepezil (ARICEPT) 5 MG tablet Take 5 mg by mouth at bedtime.   Yes Historical Provider, MD  escitalopram (LEXAPRO) 20 MG tablet Take 20 mg by mouth daily.    Yes Historical Provider, MD  Fe Cbn-Fe Gluc-FA-B12-C-DSS (FERRALET 90 PO) Take by mouth daily.   Yes Historical Provider, MD  guaiFENesin (MUCINEX) 600 MG 12 hr tablet Take 600 mg by mouth 2 (two) times daily.   Yes Historical Provider, MD  ipratropium (ATROVENT) 0.03 % nasal spray Place 2 sprays into both nostrils every 12 (twelve) hours.   Yes Historical Provider, MD  levocetirizine (XYZAL) 5 MG tablet Take 5 mg by mouth every evening.   Yes Historical Provider, MD  loperamide (IMODIUM) 2 MG capsule Take 2 mg by mouth daily as needed for diarrhea or loose stools.   Yes Historical Provider, MD  methimazole (TAPAZOLE) 5 MG tablet Take 0.5 mg by mouth daily. 05/21/16 05/21/17 Yes Historical Provider, MD  pantoprazole (PROTONIX) 40 MG tablet Take 40 mg by mouth daily.   Yes Historical Provider, MD  Probiotic Product  (ALIGN) 4 MG CAPS Take 4 mg by mouth daily.   Yes Historical Provider, MD  traMADol (ULTRAM) 50 MG tablet Take 50 mg by mouth 3 (three) times daily.    Yes Historical Provider, MD    Family History No family history on file.  Social History Social History  Substance Use Topics  . Smoking status: Never Smoker  . Smokeless tobacco: Never Used  . Alcohol use No     Allergies   Sulfa antibiotics   Review of Systems Review of Systems  Musculoskeletal:       Hip pain   All other systems reviewed and are negative.    Physical Exam Updated Vital Signs BP (!) 149/61   Pulse 67   Temp 98.2 F (36.8 C) (Oral)   Resp 17   Ht 5\' 2"  (1.575 m)   Wt 104 lb 14.4 oz (47.6 kg)   SpO2 98%   BMI 19.19 kg/m   Physical Exam  Constitutional:  Chronically ill, demented   HENT:  Head: Normocephalic.  Mouth/Throat: Oropharynx is clear and moist.  ? Small posterior hematoma, no obvious laceration   Eyes: EOM are normal. Pupils are equal, round, and reactive to light.  Neck: Normal range of motion. Neck supple.  Cardiovascular: Normal rate, regular rhythm and normal heart sounds.   Pulmonary/Chest: Effort normal and breath sounds normal. No respiratory distress. She has no wheezes. She has no rales.  Abdominal: Soft. Bowel sounds are normal.  Musculoskeletal:  Mild R hip tenderness, no obvious deformity. Able to range the hip   Neurological: She is alert.  Moving all extremities   Skin: Skin is warm.  Psychiatric: She has a normal mood and affect.  Nursing note and vitals reviewed.    ED Treatments / Results  Labs (all labs ordered are listed, but only abnormal results are displayed) Labs Reviewed  URINE CULTURE  CBC WITH DIFFERENTIAL/PLATELET  BASIC METABOLIC PANEL  URINALYSIS, COMPLETE (UACMP) WITH MICROSCOPIC    EKG  EKG Interpretation None       Radiology Dg Chest 2 View  Result Date: 05/27/2016 CLINICAL DATA:  Status post fall.  Right-sided pain. EXAM: CHEST   2 VIEW COMPARISON:  06/14/2014 FINDINGS: There is no focal parenchymal opacity. There is no pleural effusion or pneumothorax. There is stable cardiomegaly. There is thoracic aortic atherosclerosis. There is moderate osteoarthritis of bilateral glenohumeral joints. IMPRESSION: No active cardiopulmonary disease. Electronically Signed   By: Kathreen Devoid   On: 05/27/2016 13:23   Ct Head Wo Contrast  Result Date: 05/27/2016 CLINICAL DATA:  Recent fall with head and neck injury, initial encounter EXAM: CT HEAD WITHOUT CONTRAST CT CERVICAL SPINE WITHOUT CONTRAST TECHNIQUE: Multidetector CT imaging of the head and cervical spine was performed  following the standard protocol without intravenous contrast. Multiplanar CT image reconstructions of the cervical spine were also generated. COMPARISON:  01/12/2016 FINDINGS: CT HEAD FINDINGS Brain: Diffuse atrophic changes as well as chronic white matter ischemic change are seen. No findings to suggest acute hemorrhage, acute infarction or space-occupying mass lesion are noted. Vascular: No hyperdense vessel or unexpected calcification. Skull: Normal. Negative for fracture or focal lesion. Sinuses/Orbits: No acute finding. Other: None. CT CERVICAL SPINE FINDINGS Alignment: Within normal limits. Skull base and vertebrae: 7 cervical segments are well visualized. Multilevel osteophytic changes, facet hypertrophic changes and disc space narrowing is noted. No acute fracture or acute facet abnormality is noted. Bilateral neural foraminal changes are noted. Soft tissues and spinal canal: No acute soft tissue abnormality is noted. A thyroid lesion is noted on the left measuring 2.8 cm with large macrocalcification. This is stable from the prior study. Disc levels:  Multilevel disc space narrowing is seen from C3-C7. Upper chest: Visualized lung apices demonstrate scattered small vague nodular densities. CT of the chest when clinically able is recommended. IMPRESSION: CT of the head:   No acute abnormality noted. CT of the cervical spine: Multilevel degenerative change without acute abnormality. Left thyroid lesion stable from the prior exam. New vague nodular densities in the lung apices. CT of the chest is recommended for further evaluation. Electronically Signed   By: Inez Catalina M.D.   On: 05/27/2016 13:36   Ct Cervical Spine Wo Contrast  Result Date: 05/27/2016 CLINICAL DATA:  Recent fall with head and neck injury, initial encounter EXAM: CT HEAD WITHOUT CONTRAST CT CERVICAL SPINE WITHOUT CONTRAST TECHNIQUE: Multidetector CT imaging of the head and cervical spine was performed following the standard protocol without intravenous contrast. Multiplanar CT image reconstructions of the cervical spine were also generated. COMPARISON:  01/12/2016 FINDINGS: CT HEAD FINDINGS Brain: Diffuse atrophic changes as well as chronic white matter ischemic change are seen. No findings to suggest acute hemorrhage, acute infarction or space-occupying mass lesion are noted. Vascular: No hyperdense vessel or unexpected calcification. Skull: Normal. Negative for fracture or focal lesion. Sinuses/Orbits: No acute finding. Other: None. CT CERVICAL SPINE FINDINGS Alignment: Within normal limits. Skull base and vertebrae: 7 cervical segments are well visualized. Multilevel osteophytic changes, facet hypertrophic changes and disc space narrowing is noted. No acute fracture or acute facet abnormality is noted. Bilateral neural foraminal changes are noted. Soft tissues and spinal canal: No acute soft tissue abnormality is noted. A thyroid lesion is noted on the left measuring 2.8 cm with large macrocalcification. This is stable from the prior study. Disc levels:  Multilevel disc space narrowing is seen from C3-C7. Upper chest: Visualized lung apices demonstrate scattered small vague nodular densities. CT of the chest when clinically able is recommended. IMPRESSION: CT of the head:  No acute abnormality noted. CT of  the cervical spine: Multilevel degenerative change without acute abnormality. Left thyroid lesion stable from the prior exam. New vague nodular densities in the lung apices. CT of the chest is recommended for further evaluation. Electronically Signed   By: Inez Catalina M.D.   On: 05/27/2016 13:36   Dg Hip Unilat W Or Wo Pelvis 2-3 Views Right  Result Date: 05/27/2016 CLINICAL DATA:  Fall today, right posterior hip pain EXAM: DG HIP (WITH OR WITHOUT PELVIS) 2-3V RIGHT COMPARISON:  08/07/2015 FINDINGS: Three views of the right hip submitted. No acute fracture or subluxation. There is diffuse osteopenia. Stable osteoarthritic changes with spurring of femoral head. Stable spurring of right superior  acetabulum. Degenerative changes SI joints again noted. IMPRESSION: No acute fracture or subluxation. Diffuse osteopenia. Osteoarthritic changes as described above. Electronically Signed   By: Lahoma Crocker M.D.   On: 05/27/2016 13:24    Procedures Procedures (including critical care time)  Medications Ordered in ED Medications  traMADol (ULTRAM) tablet 50 mg (50 mg Oral Given 05/27/16 1429)     Initial Impression / Assessment and Plan / ED Course  I have reviewed the triage vital signs and the nursing notes.  Pertinent labs & imaging results that were available during my care of the patient were reviewed by me and considered in my medical decision making (see chart for details).     ETTIE PICTON is a 83 y.o. female here with fall. Mechanical fall, no LOC. Not on blood thinners. Will get CT head/neck, xrays.   3:08 PM Daughter came to bedside and said that she recently finished abx for UTI. Will check labs, UA. Signed out to Dr. Marcelene Butte. If labs stable, even if UA + UTI, anticipate discharge. Able to ambulate in the ED. CT head/neck stable. ? Lung nodule on CT neck that can be followed up. She is on tramadol at the facility and can continue it.    Final Clinical Impressions(s) / ED Diagnoses    Final diagnoses:  None    New Prescriptions New Prescriptions   No medications on file     Drenda Freeze, MD 05/27/16 1511

## 2016-05-27 NOTE — ED Notes (Signed)
Patient not on any blood thinners.

## 2016-05-27 NOTE — ED Notes (Signed)
Patient transported to radiology

## 2016-05-27 NOTE — ED Provider Notes (Signed)
-----------------------------------------   4:23 PM on 05/27/2016 -----------------------------------------   Blood pressure (!) 140/49, pulse 67, temperature 98.2 F (36.8 C), temperature source Oral, resp. rate 17, height 5\' 2"  (1.575 m), weight 104 lb 14.4 oz (47.6 kg), SpO2 98 %.  Assuming care from Dr. Darl Householder.  In short, Vanessa Montes is a 83 y.o. female with a chief complaint of Fall .  Refer to the original H&P for additional details.  The current plan of care is to * Follow results of the urine which was somewhat equivocal. I spoke to the daughter at the bedside and she states that she was not sure whether or not a new antibiotic was initiated. I felt with the equivocal results on her urine and a urine culture pending we will hold off on prescription antibiotics from our perspective and can be followed through the nursing facility. The daughter appears to be of understanding is the patient's afebrile. Patient has been able to get up and bear weight on her hip and does not exhibit any signs or symptoms of a occult hip fracture*.        Daymon Larsen, MD 05/27/16 [phone]

## 2016-05-27 NOTE — Discharge Instructions (Addendum)
Continue tramadol for pain.   You may have a lung nodule that can be followed up with your doctor  See your doctor   Return to ER if you have another fall, severe pain, unable to walk, fevers.    Urine culture is pending

## 2016-05-27 NOTE — ED Triage Notes (Signed)
Per [facility], patient comes from [facility] after a witnessed fall she had today. Per staff, patients right leg gave out on her and she fell and hit her head on a chair. Hx of dementia. No obvious signs of trauma to head noted.

## 2016-05-27 NOTE — ED Notes (Signed)
Patient able to ambulate with one person assist with no difficulty.

## 2016-05-29 LAB — URINE CULTURE

## 2016-05-30 NOTE — Progress Notes (Signed)
Urine culture from ED visit 2/15 resulted with 20000 colonies/ml Enterococcus faecalis. After discussion with ED provider, will fax results to patients LTCF ([facility]) at [phone] so that the provider for the facility can evaluate the patient and make decide if it is necessary to treat with antibiotics.   Ulice Dash, PharmD Clinical Pharmacist

## 2016-05-31 DIAGNOSIS — L89322 Pressure ulcer of left buttock, stage 2: Secondary | ICD-10-CM | POA: Diagnosis not present

## 2016-05-31 DIAGNOSIS — N390 Urinary tract infection, site not specified: Secondary | ICD-10-CM | POA: Diagnosis not present

## 2016-05-31 DIAGNOSIS — I10 Essential (primary) hypertension: Secondary | ICD-10-CM | POA: Diagnosis not present

## 2016-05-31 DIAGNOSIS — M6281 Muscle weakness (generalized): Secondary | ICD-10-CM | POA: Diagnosis not present

## 2016-05-31 DIAGNOSIS — F0280 Dementia in other diseases classified elsewhere without behavioral disturbance: Secondary | ICD-10-CM | POA: Diagnosis not present

## 2016-05-31 DIAGNOSIS — G20 Parkinson's disease: Secondary | ICD-10-CM | POA: Diagnosis not present

## 2016-06-01 DIAGNOSIS — G20 Parkinson's disease: Secondary | ICD-10-CM | POA: Diagnosis not present

## 2016-06-01 DIAGNOSIS — R1310 Dysphagia, unspecified: Secondary | ICD-10-CM | POA: Diagnosis not present

## 2016-06-01 DIAGNOSIS — R269 Unspecified abnormalities of gait and mobility: Secondary | ICD-10-CM | POA: Diagnosis not present

## 2016-06-01 DIAGNOSIS — R5381 Other malaise: Secondary | ICD-10-CM | POA: Diagnosis not present

## 2016-06-01 DIAGNOSIS — J988 Other specified respiratory disorders: Secondary | ICD-10-CM | POA: Diagnosis not present

## 2016-06-01 DIAGNOSIS — I10 Essential (primary) hypertension: Secondary | ICD-10-CM | POA: Diagnosis not present

## 2016-06-01 DIAGNOSIS — N390 Urinary tract infection, site not specified: Secondary | ICD-10-CM | POA: Diagnosis not present

## 2016-06-01 DIAGNOSIS — M6281 Muscle weakness (generalized): Secondary | ICD-10-CM | POA: Diagnosis not present

## 2016-06-02 DIAGNOSIS — M6281 Muscle weakness (generalized): Secondary | ICD-10-CM | POA: Diagnosis not present

## 2016-06-02 DIAGNOSIS — L89322 Pressure ulcer of left buttock, stage 2: Secondary | ICD-10-CM | POA: Diagnosis not present

## 2016-06-02 DIAGNOSIS — I10 Essential (primary) hypertension: Secondary | ICD-10-CM | POA: Diagnosis not present

## 2016-06-02 DIAGNOSIS — N390 Urinary tract infection, site not specified: Secondary | ICD-10-CM | POA: Diagnosis not present

## 2016-06-02 DIAGNOSIS — F0280 Dementia in other diseases classified elsewhere without behavioral disturbance: Secondary | ICD-10-CM | POA: Diagnosis not present

## 2016-06-02 DIAGNOSIS — G20 Parkinson's disease: Secondary | ICD-10-CM | POA: Diagnosis not present

## 2016-06-03 DIAGNOSIS — I10 Essential (primary) hypertension: Secondary | ICD-10-CM | POA: Diagnosis not present

## 2016-06-03 DIAGNOSIS — L89322 Pressure ulcer of left buttock, stage 2: Secondary | ICD-10-CM | POA: Diagnosis not present

## 2016-06-03 DIAGNOSIS — N390 Urinary tract infection, site not specified: Secondary | ICD-10-CM | POA: Diagnosis not present

## 2016-06-03 DIAGNOSIS — M6281 Muscle weakness (generalized): Secondary | ICD-10-CM | POA: Diagnosis not present

## 2016-06-03 DIAGNOSIS — G20 Parkinson's disease: Secondary | ICD-10-CM | POA: Diagnosis not present

## 2016-06-03 DIAGNOSIS — F0280 Dementia in other diseases classified elsewhere without behavioral disturbance: Secondary | ICD-10-CM | POA: Diagnosis not present

## 2016-06-07 DIAGNOSIS — G20 Parkinson's disease: Secondary | ICD-10-CM | POA: Diagnosis not present

## 2016-06-07 DIAGNOSIS — L89322 Pressure ulcer of left buttock, stage 2: Secondary | ICD-10-CM | POA: Diagnosis not present

## 2016-06-07 DIAGNOSIS — I10 Essential (primary) hypertension: Secondary | ICD-10-CM | POA: Diagnosis not present

## 2016-06-07 DIAGNOSIS — F0280 Dementia in other diseases classified elsewhere without behavioral disturbance: Secondary | ICD-10-CM | POA: Diagnosis not present

## 2016-06-07 DIAGNOSIS — N390 Urinary tract infection, site not specified: Secondary | ICD-10-CM | POA: Diagnosis not present

## 2016-06-07 DIAGNOSIS — M6281 Muscle weakness (generalized): Secondary | ICD-10-CM | POA: Diagnosis not present

## 2016-06-09 DIAGNOSIS — M6281 Muscle weakness (generalized): Secondary | ICD-10-CM | POA: Diagnosis not present

## 2016-06-09 DIAGNOSIS — L89322 Pressure ulcer of left buttock, stage 2: Secondary | ICD-10-CM | POA: Diagnosis not present

## 2016-06-09 DIAGNOSIS — N390 Urinary tract infection, site not specified: Secondary | ICD-10-CM | POA: Diagnosis not present

## 2016-06-09 DIAGNOSIS — G20 Parkinson's disease: Secondary | ICD-10-CM | POA: Diagnosis not present

## 2016-06-09 DIAGNOSIS — F0280 Dementia in other diseases classified elsewhere without behavioral disturbance: Secondary | ICD-10-CM | POA: Diagnosis not present

## 2016-06-09 DIAGNOSIS — I10 Essential (primary) hypertension: Secondary | ICD-10-CM | POA: Diagnosis not present

## 2016-06-10 DIAGNOSIS — N390 Urinary tract infection, site not specified: Secondary | ICD-10-CM | POA: Diagnosis not present

## 2016-06-10 DIAGNOSIS — M6281 Muscle weakness (generalized): Secondary | ICD-10-CM | POA: Diagnosis not present

## 2016-06-10 DIAGNOSIS — F0280 Dementia in other diseases classified elsewhere without behavioral disturbance: Secondary | ICD-10-CM | POA: Diagnosis not present

## 2016-06-10 DIAGNOSIS — G309 Alzheimer's disease, unspecified: Secondary | ICD-10-CM | POA: Diagnosis not present

## 2016-06-10 DIAGNOSIS — G20 Parkinson's disease: Secondary | ICD-10-CM | POA: Diagnosis not present

## 2016-06-10 DIAGNOSIS — L89322 Pressure ulcer of left buttock, stage 2: Secondary | ICD-10-CM | POA: Diagnosis not present

## 2016-06-10 DIAGNOSIS — I10 Essential (primary) hypertension: Secondary | ICD-10-CM | POA: Diagnosis not present

## 2016-06-13 ENCOUNTER — Emergency Department
Admission: EM | Admit: 2016-06-13 | Discharge: 2016-06-14 | Disposition: A | Discharge status: Home / Self Care | Payer: Medicare Other | Attending: Emergency Medicine | Admitting: Emergency Medicine

## 2016-06-13 ENCOUNTER — Emergency Department: Payer: Medicare Other

## 2016-06-13 ENCOUNTER — Encounter: Payer: Self-pay | Admitting: Emergency Medicine

## 2016-06-13 DIAGNOSIS — S0083XA Contusion of other part of head, initial encounter: Secondary | ICD-10-CM | POA: Insufficient documentation

## 2016-06-13 DIAGNOSIS — S299XXA Unspecified injury of thorax, initial encounter: Secondary | ICD-10-CM | POA: Diagnosis not present

## 2016-06-13 DIAGNOSIS — W19XXXA Unspecified fall, initial encounter: Secondary | ICD-10-CM

## 2016-06-13 DIAGNOSIS — Y9389 Activity, other specified: Secondary | ICD-10-CM | POA: Insufficient documentation

## 2016-06-13 DIAGNOSIS — I10 Essential (primary) hypertension: Secondary | ICD-10-CM | POA: Diagnosis not present

## 2016-06-13 DIAGNOSIS — Y999 Unspecified external cause status: Secondary | ICD-10-CM | POA: Insufficient documentation

## 2016-06-13 DIAGNOSIS — Y92009 Unspecified place in unspecified non-institutional (private) residence as the place of occurrence of the external cause: Secondary | ICD-10-CM | POA: Insufficient documentation

## 2016-06-13 DIAGNOSIS — S0990XA Unspecified injury of head, initial encounter: Secondary | ICD-10-CM | POA: Diagnosis not present

## 2016-06-13 DIAGNOSIS — R1011 Right upper quadrant pain: Secondary | ICD-10-CM | POA: Diagnosis not present

## 2016-06-13 DIAGNOSIS — S199XXA Unspecified injury of neck, initial encounter: Secondary | ICD-10-CM | POA: Diagnosis not present

## 2016-06-13 DIAGNOSIS — R9431 Abnormal electrocardiogram [ECG] [EKG]: Secondary | ICD-10-CM | POA: Diagnosis not present

## 2016-06-13 DIAGNOSIS — N390 Urinary tract infection, site not specified: Secondary | ICD-10-CM | POA: Diagnosis not present

## 2016-06-13 DIAGNOSIS — R410 Disorientation, unspecified: Secondary | ICD-10-CM | POA: Diagnosis not present

## 2016-06-13 DIAGNOSIS — Z79899 Other long term (current) drug therapy: Secondary | ICD-10-CM | POA: Diagnosis not present

## 2016-06-13 DIAGNOSIS — E119 Type 2 diabetes mellitus without complications: Secondary | ICD-10-CM | POA: Insufficient documentation

## 2016-06-13 DIAGNOSIS — G20 Parkinson's disease: Secondary | ICD-10-CM | POA: Insufficient documentation

## 2016-06-13 DIAGNOSIS — W06XXXA Fall from bed, initial encounter: Secondary | ICD-10-CM | POA: Insufficient documentation

## 2016-06-13 DIAGNOSIS — S20212A Contusion of left front wall of thorax, initial encounter: Secondary | ICD-10-CM | POA: Diagnosis not present

## 2016-06-13 MED ORDER — OXYCODONE-ACETAMINOPHEN 5-325 MG PO TABS
1.0000 | ORAL_TABLET | Freq: Once | ORAL | Status: AC
  Administered 2016-06-13: 1 via ORAL
  Filled 2016-06-13: qty 1

## 2016-06-13 MED ORDER — IBUPROFEN 600 MG PO TABS
600.0000 mg | ORAL_TABLET | Freq: Once | ORAL | Status: AC
  Administered 2016-06-13: 600 mg via ORAL
  Filled 2016-06-13: qty 1

## 2016-06-13 MED ORDER — FENTANYL CITRATE (PF) 100 MCG/2ML IJ SOLN
50.0000 ug | Freq: Once | INTRAMUSCULAR | Status: AC
  Administered 2016-06-13: 50 ug via INTRAVENOUS
  Filled 2016-06-13: qty 2

## 2016-06-13 MED ORDER — OXYCODONE-ACETAMINOPHEN 5-325 MG PO TABS: 1.0000 | ORAL_TABLET | Freq: Four times a day (QID) | ORAL | 0 refills | Status: AC | PRN

## 2016-06-13 NOTE — ED Notes (Signed)
Pt in ct 

## 2016-06-13 NOTE — ED Triage Notes (Signed)
Pt brought by ems from the [facility] after being found by staff half in and out of the bed, pt's legs in the bed and pt's head on the floor. Pt is holding the rt side of her chest and presents as if it is painful to breathe, pt has an abrasion to the rt side of her head, rt side of her chest over her lower ribs, and rt hip area

## 2016-06-13 NOTE — ED Notes (Signed)
Pt up ambulating in hallway with walker; Dr Burlene Arnt with pt and family in the hallway, talking; ED tech, Beth close by for safety

## 2016-06-13 NOTE — ED Provider Notes (Addendum)
[facility] Emergency Department Provider Note  ____________________________________________   I have reviewed the triage vital signs and the nursing notes.   HISTORY  Chief Complaint No chief complaint on file.    HPI Vanessa Montes is a 83 y.o. female who presents today complaining of right-sided rib pain after a fall. She was reaching for a phone she states and she fell. She states she did bump her head. She did not pass out. She remembers the fall. It was not syncopal. She slid off the bed. She is completely significant pain to the right chest wall. She states it hurts to breathe or move. No other complaints.     Past Medical History:  Diagnosis Date  . Anemia   . Carotid artery occlusion   . Dementia   . Depression   . Diabetes mellitus without complication ([place])   . GERD (gastroesophageal reflux disease)   . HLD (hyperlipidemia)   . Hypertension   . Parkinson's disease ([place])   . PD (Parkinson's disease) ([place])   . Thyroid nodule     There are no active problems to display for this patient.   Past Surgical History:  Procedure Laterality Date  . ABDOMINAL HYSTERECTOMY    . APPENDECTOMY    . CAROTID ENDARTERECTOMY    . ESOPHAGOGASTRODUODENOSCOPY (EGD) WITH PROPOFOL N/A 03/03/2016   Procedure: ESOPHAGOGASTRODUODENOSCOPY (EGD) WITH PROPOFOL;  Surgeon: Manya Silvas, MD;  Location: [facility] ENDOSCOPY;  Service: Endoscopy;  Laterality: N/A;  w/ Dilation (+) Diabetes  . EYE SURGERY Right 11/09/2011  . JOINT REPLACEMENT    . JOINT REPLACEMENT Right 2014   knee  . PARATHYROIDECTOMY      Prior to Admission medications   Medication Sig Start Date End Date Taking? Authorizing Provider  acetaminophen (TYLENOL) 500 MG tablet Take 500 mg by mouth every 6 (six) hours as needed.    Historical Provider, MD  amLODipine (NORVASC) 5 MG tablet Take 5 mg by mouth daily.    Historical Provider, MD  Artificial Tear Solution (TEARS NATURALE II) SOLN Apply 1  drop to eye every 4 (four) hours as needed.    Historical Provider, MD  azelastine (OPTIVAR) 0.05 % ophthalmic solution 1 drop 2 (two) times daily.    Historical Provider, MD  carbidopa-levodopa (SINEMET IR) 25-100 MG tablet Take 1 tablet by mouth 4 (four) times daily.     Historical Provider, MD  cholecalciferol (VITAMIN D) 400 units TABS tablet Take 400 Units by mouth daily.     Historical Provider, MD  clopidogrel (PLAVIX) 75 MG tablet Take 75 mg by mouth daily.    Historical Provider, MD  clotrimazole-betamethasone (LOTRISONE) cream Apply 1 application topically 2 (two) times daily.    Historical Provider, MD  dextrose (GLUTOSE) 40 % GEL Take 1 Tube by mouth once as needed for low blood sugar.    Historical Provider, MD  docusate sodium (COLACE) 100 MG capsule Take 100 mg by mouth daily.     Historical Provider, MD  donepezil (ARICEPT) 5 MG tablet Take 5 mg by mouth at bedtime.    Historical Provider, MD  escitalopram (LEXAPRO) 20 MG tablet Take 20 mg by mouth daily.     Historical Provider, MD  Fe Cbn-Fe Gluc-FA-B12-C-DSS (FERRALET 90 PO) Take by mouth daily.    Historical Provider, MD  guaiFENesin (MUCINEX) 600 MG 12 hr tablet Take 600 mg by mouth 2 (two) times daily.    Historical Provider, MD  ipratropium (ATROVENT) 0.03 % nasal spray Place 2  sprays into both nostrils every 12 (twelve) hours.    Historical Provider, MD  levocetirizine (XYZAL) 5 MG tablet Take 5 mg by mouth every evening.    Historical Provider, MD  loperamide (IMODIUM) 2 MG capsule Take 2 mg by mouth daily as needed for diarrhea or loose stools.    Historical Provider, MD  methimazole (TAPAZOLE) 5 MG tablet Take 0.5 mg by mouth daily. 05/21/16 05/21/17  Historical Provider, MD  pantoprazole (PROTONIX) 40 MG tablet Take 40 mg by mouth daily.    Historical Provider, MD  Probiotic Product (ALIGN) 4 MG CAPS Take 4 mg by mouth daily.    Historical Provider, MD  traMADol (ULTRAM) 50 MG tablet Take 50 mg by mouth 3 (three) times  daily.     Historical Provider, MD    Allergies Sulfa antibiotics  No family history on file.  Social History Social History  Substance Use Topics  . Smoking status: Never Smoker  . Smokeless tobacco: Never Used  . Alcohol use No    Review of Systems Constitutional: No fever/chills Eyes: No visual changes. ENT: No sore throat. No stiff neck no neck pain Cardiovascular: The history of present illness Respiratory: See history of present illness Gastrointestinal:   no vomiting.  No diarrhea.  No constipation. Genitourinary: Negative for dysuria. Musculoskeletal: Negative lower extremity swelling Skin: Negative for rash. Neurological: Negative for severe headaches, focal weakness or numbness. 10-point ROS otherwise negative.  ____________________________________________   PHYSICAL EXAM:  VITAL SIGNS: ED Triage Vitals  Enc Vitals Group     BP      Pulse      Resp      Temp      Temp src      SpO2      Weight      Height      Head Circumference      Peak Flow      Pain Score      Pain Loc      Pain Edu?      Excl. in [place]?     Constitutional: Alert and oriented. Well appearing and in no acute distress. Eyes: Conjunctivae are normal. PERRL. EOMI. Head: Bruising noted forehead. Nose: No congestion/rhinnorhea. Mouth/Throat: Mucous membranes are moist.  Oropharynx non-erythematous. Neck: No stridor.   Nontender with no meningismus Cardiovascular: Normal rate, regular rhythm. Grossly normal heart sounds.  Good peripheral circulation. Respiratory: Normal respiratory effort.  No retractions. Lungs CTAB., Patient has some mild splinting however from pain on the right chest wall Chest: Female nurse chaperone present, brandy, there is tenderness and bruising to the right chest wall with no obvious flail chest and breath sounds are noted bilaterally to be symmetric Abdominal: Soft and nontender. No distention. No guarding no rebound Back:  There is no focal tenderness or  step off.  there is no midline tenderness there are no lesions noted. there is no CVA tenderness Musculoskeletal: No lower extremity tenderness, no upper extremity tenderness. No joint effusions, no DVT signs strong distal pulses no edema Neurologic:  Normal speech and language. No gross focal neurologic deficits are appreciated.  Skin:  Skin is warm, dry and intact. No rash noted. Psychiatric: Mood and affect are normal. Speech and behavior are normal.  ____________________________________________   LABS (all labs ordered are listed, but only abnormal results are displayed)  Labs Reviewed - No data to display ____________________________________________  EKG  I personally interpreted any EKGs ordered by me or triage  ____________________________________________  RADIOLOGY  I  reviewed any imaging ordered by me or triage that were performed during my shift and, if possible, patient and/or family made aware of any abnormal findings. ____________________________________________   PROCEDURES  Procedure(s) performed: None  Procedures  Critical Care performed: None  ____________________________________________   INITIAL IMPRESSION / ASSESSMENT AND PLAN / ED COURSE  Pertinent labs & imaging results that were available during my care of the patient were reviewed by me and considered in my medical decision making (see chart for details).  Patient with a non-syncopal, remembered mechanical fall with reproducible chest wall pain. Given her age and the fact that she states she hit her head we will obtain CT of the head, I will give her pain medication we'll obtain chest x-ray at this time there does not appear to be an indication for an emergent thoracotomy tube or evidence of a pneumothorax or hemothorax clinically   ----------------------------------------- 10:14 PM on 06/13/2016 -----------------------------------------  Sats are good lungs are clear, no evidence of  hemopneumothorax at this time. The patient imaging has been reviewed. No evidence of acute traumatic injury. She has no facial pain. Psych clear what she has some sinus opacity but I don't think is related to why she is here today. Patient of family made aware of it. She is having ongoing pain which is quite reproducible and the chest wall on the right. She is holding that area but she is breathing more easily after the pain medication. I talked to the family, the plan is to see if we can get the patient home as long as we can obtain adequate analgesia. We will try oral medication including Percocet and anti-inflammatories. She had full painless range of motion of her hip and no evidence of a hip fracture and no complaint of hip pain. Because of the other injury noted to CT head and CT cervical spine which are reassuring. It is my hope that if we can get her pain under control she can go home.  ----------------------------------------- 11:52 PM on 06/13/2016 -----------------------------------------  Patient with good pain control ambulating with no difficulty using a walker at baseline. Family feels that she is safe for discharge. I did offer admission for pain control and observation but there for this time to go home. He would only be an observational admission if that I am not certain that the hospitalist would agree given that she is ambulatory and her pain is well-controlled. No evidence of other pathology. This is a non-syncopal fall she has no complaints and we will discharge with close outpatient follow-up.     ____________________________________________   FINAL CLINICAL IMPRESSION(S) / ED DIAGNOSES  Final diagnoses:  None      This chart was dictated using voice recognition software.  Despite best efforts to proofread,  errors can occur which can change meaning.      Schuyler Amor, MD 06/13/16 2011    Schuyler Amor, MD 06/13/16 [id number]    Schuyler Amor, MD 06/13/16  Anderson, MD 06/13/16 [phone]

## 2016-06-13 NOTE — Discharge Instructions (Addendum)
1. Take antibiotic as prescribed (Keflex 500 mg 3 times daily 7 days). 2. You may take Percocet as needed for pain. 3. Return to the ER for worsening symptoms, persistent vomiting, difficulty breathing or other concerns.

## 2016-06-14 ENCOUNTER — Emergency Department: Payer: Medicare Other

## 2016-06-14 DIAGNOSIS — M6281 Muscle weakness (generalized): Secondary | ICD-10-CM | POA: Diagnosis not present

## 2016-06-14 DIAGNOSIS — L89322 Pressure ulcer of left buttock, stage 2: Secondary | ICD-10-CM | POA: Diagnosis not present

## 2016-06-14 DIAGNOSIS — S20212A Contusion of left front wall of thorax, initial encounter: Secondary | ICD-10-CM | POA: Diagnosis not present

## 2016-06-14 DIAGNOSIS — R410 Disorientation, unspecified: Secondary | ICD-10-CM | POA: Diagnosis not present

## 2016-06-14 DIAGNOSIS — N390 Urinary tract infection, site not specified: Secondary | ICD-10-CM | POA: Diagnosis not present

## 2016-06-14 DIAGNOSIS — F0280 Dementia in other diseases classified elsewhere without behavioral disturbance: Secondary | ICD-10-CM | POA: Diagnosis not present

## 2016-06-14 DIAGNOSIS — I10 Essential (primary) hypertension: Secondary | ICD-10-CM | POA: Diagnosis not present

## 2016-06-14 DIAGNOSIS — G20 Parkinson's disease: Secondary | ICD-10-CM | POA: Diagnosis not present

## 2016-06-14 LAB — URINALYSIS, COMPLETE (UACMP) WITH MICROSCOPIC
BACTERIA UA: NONE SEEN
Bilirubin Urine: NEGATIVE
Glucose, UA: NEGATIVE mg/dL
Hgb urine dipstick: NEGATIVE
KETONES UR: 80 mg/dL — AB
Nitrite: NEGATIVE
PROTEIN: 100 mg/dL — AB
Specific Gravity, Urine: 1.02 (ref 1.005–1.030)
pH: 5 (ref 5.0–8.0)

## 2016-06-14 LAB — COMPREHENSIVE METABOLIC PANEL
ALT: 6 U/L — ABNORMAL LOW (ref 14–54)
ANION GAP: 13 (ref 5–15)
AST: 14 U/L — ABNORMAL LOW (ref 15–41)
Albumin: 3.3 g/dL — ABNORMAL LOW (ref 3.5–5.0)
Alkaline Phosphatase: 58 U/L (ref 38–126)
BILIRUBIN TOTAL: 1.5 mg/dL — AB (ref 0.3–1.2)
BUN: 15 mg/dL (ref 6–20)
CHLORIDE: 108 mmol/L (ref 101–111)
CO2: 18 mmol/L — ABNORMAL LOW (ref 22–32)
Calcium: 9.9 mg/dL (ref 8.9–10.3)
Creatinine, Ser: 0.57 mg/dL (ref 0.44–1.00)
GFR calc Af Amer: >60 mL/min (ref >60)
Glucose, Bld: 97 mg/dL (ref 65–99)
POTASSIUM: 3.5 mmol/L (ref 3.5–5.1)
Sodium: 139 mmol/L (ref 135–145)
TOTAL PROTEIN: 6 g/dL — AB (ref 6.5–8.1)

## 2016-06-14 LAB — CBC WITH DIFFERENTIAL/PLATELET
BASOS ABS: 0.1 10*3/uL (ref 0–0.1)
BASOS PCT: 2 %
EOS PCT: 1 %
Eosinophils Absolute: 0.1 10*3/uL (ref 0–0.7)
HCT: 29.7 % — ABNORMAL LOW (ref 35.0–47.0)
Hemoglobin: 10.6 g/dL — ABNORMAL LOW (ref 12.0–16.0)
Lymphocytes Relative: 20 %
Lymphs Abs: 1.2 10*3/uL (ref 1.0–3.6)
MCH: 32.7 pg (ref 26.0–34.0)
MCHC: 35.7 g/dL (ref 32.0–36.0)
MCV: 91.6 fL (ref 80.0–100.0)
MONO ABS: 0.5 10*3/uL (ref 0.2–0.9)
MONOS PCT: 9 %
NEUTROS ABS: 4.1 10*3/uL (ref 1.4–6.5)
Neutrophils Relative %: 68 %
PLATELETS: 240 10*3/uL (ref 150–440)
RBC: 3.24 MIL/uL — ABNORMAL LOW (ref 3.80–5.20)
RDW: 14 % (ref 11.5–14.5)
WBC: 6 10*3/uL (ref 3.6–11.0)

## 2016-06-14 LAB — AMMONIA: AMMONIA: 23 umol/L (ref 9–35)

## 2016-06-14 LAB — LIPASE, BLOOD: LIPASE: 12 U/L (ref 11–51)

## 2016-06-14 LAB — TROPONIN I

## 2016-06-14 MED ORDER — CEPHALEXIN 500 MG PO CAPS
500.0000 mg | ORAL_CAPSULE | Freq: Once | ORAL | Status: AC
  Administered 2016-06-14: 500 mg via ORAL
  Filled 2016-06-14: qty 1

## 2016-06-14 MED ORDER — CEPHALEXIN 500 MG PO CAPS: 500.0000 mg | ORAL_CAPSULE | Freq: Three times a day (TID) | ORAL | 0 refills | Status: AC

## 2016-06-14 MED ORDER — SODIUM CHLORIDE 0.9 % IV BOLUS (SEPSIS)
1000.0000 mL | Freq: Once | INTRAVENOUS | Status: AC
  Administered 2016-06-14: 1000 mL via INTRAVENOUS

## 2016-06-14 NOTE — ED Notes (Signed)
Pt discharged home after verbalizing understanding of discharge instructions; nad noted. 

## 2016-06-14 NOTE — ED Notes (Signed)
Called the [facility] for transport. Transferred to a voice mail. Left message for them to return my call regarding picking up pt.

## 2016-06-14 NOTE — ED Notes (Addendum)
After trying to unsuccessfully call report to [facility], I went in to speak with pt and her daughter; we discussed discharge instructions, prescriptions and daughter understands use of incentive spirometer; after dressing pt, assisting her to wheelchair and daughter signing discharge, pt was noted to have speech aphasia; pt was asking for something to blow her nose but could not think of tissue or Kleenex; daughter says since she's been there she's noticed pt has had multiple episodes of pt having difficulty finding words and speaking; Dr Beather Arbour notified and pt to stay the night for more testing and observation

## 2016-06-14 NOTE — ED Notes (Signed)
Spoke with staff at [facility], their transport driver does not come in until 8am; will have the nurse coming on call at 8am to check on transport for pt; staff says they will leave a note for the driver that pt needs to be picked up as well

## 2016-06-14 NOTE — ED Provider Notes (Signed)
-----------------------------------------   1:06 AM on 06/14/2016 -----------------------------------------  On discharge, daughter voiced concern that patient's sensorium seemed altered and confused. Her daughter states that patient will, from time to time, have difficulty getting out her words but tonight her symptoms seem worse than usual. On exam patient complains of chest pain from her fall. She does have intermittent confusion. Motor strength and sensation 5/5 all extremities, CN II-XII grossly intact, alert and oriented 2. She has already had a CT last evening which was negative for intracranial hemorrhage. Will hold discharge to obtain screening lab work, urinalysis, infuse IV fluids and reassess.  ED ECG REPORT I, SUNG,JADE J, the attending physician, personally viewed and interpreted this ECG.   Date: 06/14/2016  EKG Time: 0230  Rate: 59  Rhythm: normal EKG, normal sinus rhythm  Axis: LAD  Intervals:nonspecific intraventricular conduction delay  ST&T Change: Nonspecific No change from 04/2013   ----------------------------------------- 4:36 AM on 06/14/2016 -----------------------------------------  Noted elevated bilirubin. Will check lipase, ammonia and obtain right upper quadrant limited ultrasound. Patient resting in no acute distress.  ----------------------------------------- 6:21 AM on 06/14/2016 -----------------------------------------  Ultrasound interpreted per Dr. Alroy Dust: Normal liver and bile ducts. Unremarkable appearances of the  cholecystectomy bed.   Updated patient of laboratory and imaging results. Will place on Keflex for mild UTI. Patient is not particularly confused or altered. She remains alert and oriented to person and place. No evidence of CVA. Remains neurologically intact without focal neurological deficits. Strict return precautions given. Patient verbalizes understanding and agrees with plan of care.    Paulette Blanch, MD 06/14/16 [phone]

## 2016-06-14 NOTE — ED Notes (Signed)
Patient transported to Ultrasound via stretcher, transported by [employer], [place] tech

## 2016-06-14 NOTE — ED Notes (Signed)
Pt back from US

## 2016-06-14 NOTE — ED Notes (Signed)
After 7 failed IV attempts, Shanon Brow, RN in to attempt; (2 attempts by this nurse, 2 attempts by Modena Nunnery, RN and 3 attempts by Amy, RN);

## 2016-06-14 NOTE — ED Notes (Signed)
In and Out cath urine obtained by Eliezer Lofts, RN with this nurse assisting; pt tolerated well; specimen to lab;

## 2016-06-15 DIAGNOSIS — R0600 Dyspnea, unspecified: Secondary | ICD-10-CM | POA: Diagnosis not present

## 2016-06-15 DIAGNOSIS — M6281 Muscle weakness (generalized): Secondary | ICD-10-CM | POA: Diagnosis not present

## 2016-06-15 DIAGNOSIS — R269 Unspecified abnormalities of gait and mobility: Secondary | ICD-10-CM | POA: Diagnosis not present

## 2016-06-15 DIAGNOSIS — R5381 Other malaise: Secondary | ICD-10-CM | POA: Diagnosis not present

## 2016-06-15 DIAGNOSIS — R1310 Dysphagia, unspecified: Secondary | ICD-10-CM | POA: Diagnosis not present

## 2016-06-15 DIAGNOSIS — I10 Essential (primary) hypertension: Secondary | ICD-10-CM | POA: Diagnosis not present

## 2016-06-15 DIAGNOSIS — F329 Major depressive disorder, single episode, unspecified: Secondary | ICD-10-CM | POA: Diagnosis not present

## 2016-06-15 DIAGNOSIS — G20 Parkinson's disease: Secondary | ICD-10-CM | POA: Diagnosis not present

## 2016-06-16 DIAGNOSIS — L89322 Pressure ulcer of left buttock, stage 2: Secondary | ICD-10-CM | POA: Diagnosis not present

## 2016-06-16 DIAGNOSIS — N390 Urinary tract infection, site not specified: Secondary | ICD-10-CM | POA: Diagnosis not present

## 2016-06-16 DIAGNOSIS — G20 Parkinson's disease: Secondary | ICD-10-CM | POA: Diagnosis not present

## 2016-06-16 DIAGNOSIS — F0280 Dementia in other diseases classified elsewhere without behavioral disturbance: Secondary | ICD-10-CM | POA: Diagnosis not present

## 2016-06-16 DIAGNOSIS — M6281 Muscle weakness (generalized): Secondary | ICD-10-CM | POA: Diagnosis not present

## 2016-06-16 DIAGNOSIS — I10 Essential (primary) hypertension: Secondary | ICD-10-CM | POA: Diagnosis not present

## 2016-06-17 LAB — URINE CULTURE: CULTURE: NO GROWTH

## 2016-06-22 DIAGNOSIS — R1310 Dysphagia, unspecified: Secondary | ICD-10-CM | POA: Diagnosis not present

## 2016-06-22 DIAGNOSIS — R5381 Other malaise: Secondary | ICD-10-CM | POA: Diagnosis not present

## 2016-06-22 DIAGNOSIS — M6281 Muscle weakness (generalized): Secondary | ICD-10-CM | POA: Diagnosis not present

## 2016-06-22 DIAGNOSIS — I10 Essential (primary) hypertension: Secondary | ICD-10-CM | POA: Diagnosis not present

## 2016-06-22 DIAGNOSIS — R0600 Dyspnea, unspecified: Secondary | ICD-10-CM | POA: Diagnosis not present

## 2016-06-22 DIAGNOSIS — R071 Chest pain on breathing: Secondary | ICD-10-CM | POA: Diagnosis not present

## 2016-06-22 DIAGNOSIS — F329 Major depressive disorder, single episode, unspecified: Secondary | ICD-10-CM | POA: Diagnosis not present

## 2016-06-22 DIAGNOSIS — R269 Unspecified abnormalities of gait and mobility: Secondary | ICD-10-CM | POA: Diagnosis not present

## 2016-06-22 DIAGNOSIS — G20 Parkinson's disease: Secondary | ICD-10-CM | POA: Diagnosis not present

## 2016-06-23 DIAGNOSIS — G20 Parkinson's disease: Secondary | ICD-10-CM | POA: Diagnosis not present

## 2016-06-23 DIAGNOSIS — F329 Major depressive disorder, single episode, unspecified: Secondary | ICD-10-CM | POA: Diagnosis not present

## 2016-06-23 DIAGNOSIS — R1310 Dysphagia, unspecified: Secondary | ICD-10-CM | POA: Diagnosis not present

## 2016-06-23 DIAGNOSIS — I10 Essential (primary) hypertension: Secondary | ICD-10-CM | POA: Diagnosis not present

## 2016-06-24 DIAGNOSIS — F329 Major depressive disorder, single episode, unspecified: Secondary | ICD-10-CM | POA: Diagnosis not present

## 2016-06-24 DIAGNOSIS — G20 Parkinson's disease: Secondary | ICD-10-CM | POA: Diagnosis not present

## 2016-06-24 DIAGNOSIS — R1310 Dysphagia, unspecified: Secondary | ICD-10-CM | POA: Diagnosis not present

## 2016-06-24 DIAGNOSIS — I10 Essential (primary) hypertension: Secondary | ICD-10-CM | POA: Diagnosis not present

## 2016-06-25 DIAGNOSIS — I10 Essential (primary) hypertension: Secondary | ICD-10-CM | POA: Diagnosis not present

## 2016-06-25 DIAGNOSIS — G20 Parkinson's disease: Secondary | ICD-10-CM | POA: Diagnosis not present

## 2016-06-25 DIAGNOSIS — R1310 Dysphagia, unspecified: Secondary | ICD-10-CM | POA: Diagnosis not present

## 2016-06-25 DIAGNOSIS — F329 Major depressive disorder, single episode, unspecified: Secondary | ICD-10-CM | POA: Diagnosis not present

## 2016-06-28 DIAGNOSIS — B351 Tinea unguium: Secondary | ICD-10-CM | POA: Diagnosis not present

## 2016-06-28 DIAGNOSIS — L600 Ingrowing nail: Secondary | ICD-10-CM | POA: Diagnosis not present

## 2016-06-28 DIAGNOSIS — M79673 Pain in unspecified foot: Secondary | ICD-10-CM | POA: Diagnosis not present

## 2016-06-28 DIAGNOSIS — G20 Parkinson's disease: Secondary | ICD-10-CM | POA: Diagnosis not present

## 2016-06-28 DIAGNOSIS — F329 Major depressive disorder, single episode, unspecified: Secondary | ICD-10-CM | POA: Diagnosis not present

## 2016-06-28 DIAGNOSIS — I10 Essential (primary) hypertension: Secondary | ICD-10-CM | POA: Diagnosis not present

## 2016-06-28 DIAGNOSIS — R262 Difficulty in walking, not elsewhere classified: Secondary | ICD-10-CM | POA: Diagnosis not present

## 2016-06-28 DIAGNOSIS — R1310 Dysphagia, unspecified: Secondary | ICD-10-CM | POA: Diagnosis not present

## 2016-06-29 DIAGNOSIS — I10 Essential (primary) hypertension: Secondary | ICD-10-CM | POA: Diagnosis not present

## 2016-06-29 DIAGNOSIS — F329 Major depressive disorder, single episode, unspecified: Secondary | ICD-10-CM | POA: Diagnosis not present

## 2016-06-29 DIAGNOSIS — G20 Parkinson's disease: Secondary | ICD-10-CM | POA: Diagnosis not present

## 2016-06-29 DIAGNOSIS — R1310 Dysphagia, unspecified: Secondary | ICD-10-CM | POA: Diagnosis not present

## 2016-06-30 DIAGNOSIS — R1310 Dysphagia, unspecified: Secondary | ICD-10-CM | POA: Diagnosis not present

## 2016-06-30 DIAGNOSIS — G20 Parkinson's disease: Secondary | ICD-10-CM | POA: Diagnosis not present

## 2016-06-30 DIAGNOSIS — I10 Essential (primary) hypertension: Secondary | ICD-10-CM | POA: Diagnosis not present

## 2016-06-30 DIAGNOSIS — F329 Major depressive disorder, single episode, unspecified: Secondary | ICD-10-CM | POA: Diagnosis not present

## 2016-07-01 DIAGNOSIS — I10 Essential (primary) hypertension: Secondary | ICD-10-CM | POA: Diagnosis not present

## 2016-07-01 DIAGNOSIS — F329 Major depressive disorder, single episode, unspecified: Secondary | ICD-10-CM | POA: Diagnosis not present

## 2016-07-01 DIAGNOSIS — R1310 Dysphagia, unspecified: Secondary | ICD-10-CM | POA: Diagnosis not present

## 2016-07-01 DIAGNOSIS — G20 Parkinson's disease: Secondary | ICD-10-CM | POA: Diagnosis not present

## 2016-07-02 DIAGNOSIS — F329 Major depressive disorder, single episode, unspecified: Secondary | ICD-10-CM | POA: Diagnosis not present

## 2016-07-02 DIAGNOSIS — G20 Parkinson's disease: Secondary | ICD-10-CM | POA: Diagnosis not present

## 2016-07-02 DIAGNOSIS — R1310 Dysphagia, unspecified: Secondary | ICD-10-CM | POA: Diagnosis not present

## 2016-07-02 DIAGNOSIS — I10 Essential (primary) hypertension: Secondary | ICD-10-CM | POA: Diagnosis not present

## 2016-07-05 DIAGNOSIS — G20 Parkinson's disease: Secondary | ICD-10-CM | POA: Diagnosis not present

## 2016-07-05 DIAGNOSIS — I10 Essential (primary) hypertension: Secondary | ICD-10-CM | POA: Diagnosis not present

## 2016-07-05 DIAGNOSIS — F329 Major depressive disorder, single episode, unspecified: Secondary | ICD-10-CM | POA: Diagnosis not present

## 2016-07-05 DIAGNOSIS — R1310 Dysphagia, unspecified: Secondary | ICD-10-CM | POA: Diagnosis not present

## 2016-07-06 DIAGNOSIS — R1310 Dysphagia, unspecified: Secondary | ICD-10-CM | POA: Diagnosis not present

## 2016-07-06 DIAGNOSIS — I10 Essential (primary) hypertension: Secondary | ICD-10-CM | POA: Diagnosis not present

## 2016-07-06 DIAGNOSIS — F329 Major depressive disorder, single episode, unspecified: Secondary | ICD-10-CM | POA: Diagnosis not present

## 2016-07-06 DIAGNOSIS — G20 Parkinson's disease: Secondary | ICD-10-CM | POA: Diagnosis not present

## 2016-07-07 DIAGNOSIS — I10 Essential (primary) hypertension: Secondary | ICD-10-CM | POA: Diagnosis not present

## 2016-07-07 DIAGNOSIS — G20 Parkinson's disease: Secondary | ICD-10-CM | POA: Diagnosis not present

## 2016-07-07 DIAGNOSIS — R1310 Dysphagia, unspecified: Secondary | ICD-10-CM | POA: Diagnosis not present

## 2016-07-07 DIAGNOSIS — F329 Major depressive disorder, single episode, unspecified: Secondary | ICD-10-CM | POA: Diagnosis not present

## 2016-07-08 DIAGNOSIS — G20 Parkinson's disease: Secondary | ICD-10-CM | POA: Diagnosis not present

## 2016-07-08 DIAGNOSIS — I10 Essential (primary) hypertension: Secondary | ICD-10-CM | POA: Diagnosis not present

## 2016-07-08 DIAGNOSIS — F329 Major depressive disorder, single episode, unspecified: Secondary | ICD-10-CM | POA: Diagnosis not present

## 2016-07-08 DIAGNOSIS — R1310 Dysphagia, unspecified: Secondary | ICD-10-CM | POA: Diagnosis not present

## 2016-07-09 DIAGNOSIS — I10 Essential (primary) hypertension: Secondary | ICD-10-CM | POA: Diagnosis not present

## 2016-07-09 DIAGNOSIS — F329 Major depressive disorder, single episode, unspecified: Secondary | ICD-10-CM | POA: Diagnosis not present

## 2016-07-09 DIAGNOSIS — R1310 Dysphagia, unspecified: Secondary | ICD-10-CM | POA: Diagnosis not present

## 2016-07-09 DIAGNOSIS — G20 Parkinson's disease: Secondary | ICD-10-CM | POA: Diagnosis not present

## 2016-07-11 DIAGNOSIS — R1310 Dysphagia, unspecified: Secondary | ICD-10-CM | POA: Diagnosis not present

## 2016-07-11 DIAGNOSIS — G20 Parkinson's disease: Secondary | ICD-10-CM | POA: Diagnosis not present

## 2016-07-11 DIAGNOSIS — F329 Major depressive disorder, single episode, unspecified: Secondary | ICD-10-CM | POA: Diagnosis not present

## 2016-07-11 DIAGNOSIS — I10 Essential (primary) hypertension: Secondary | ICD-10-CM | POA: Diagnosis not present

## 2016-07-12 DIAGNOSIS — R1310 Dysphagia, unspecified: Secondary | ICD-10-CM | POA: Diagnosis not present

## 2016-07-12 DIAGNOSIS — F329 Major depressive disorder, single episode, unspecified: Secondary | ICD-10-CM | POA: Diagnosis not present

## 2016-07-12 DIAGNOSIS — G20 Parkinson's disease: Secondary | ICD-10-CM | POA: Diagnosis not present

## 2016-07-12 DIAGNOSIS — I10 Essential (primary) hypertension: Secondary | ICD-10-CM | POA: Diagnosis not present

## 2016-07-13 DIAGNOSIS — R1310 Dysphagia, unspecified: Secondary | ICD-10-CM | POA: Diagnosis not present

## 2016-07-13 DIAGNOSIS — I10 Essential (primary) hypertension: Secondary | ICD-10-CM | POA: Diagnosis not present

## 2016-07-13 DIAGNOSIS — G20 Parkinson's disease: Secondary | ICD-10-CM | POA: Diagnosis not present

## 2016-07-13 DIAGNOSIS — F329 Major depressive disorder, single episode, unspecified: Secondary | ICD-10-CM | POA: Diagnosis not present

## 2016-07-14 DIAGNOSIS — G20 Parkinson's disease: Secondary | ICD-10-CM | POA: Diagnosis not present

## 2016-07-14 DIAGNOSIS — F329 Major depressive disorder, single episode, unspecified: Secondary | ICD-10-CM | POA: Diagnosis not present

## 2016-07-14 DIAGNOSIS — I10 Essential (primary) hypertension: Secondary | ICD-10-CM | POA: Diagnosis not present

## 2016-07-14 DIAGNOSIS — R1310 Dysphagia, unspecified: Secondary | ICD-10-CM | POA: Diagnosis not present

## 2016-07-15 DIAGNOSIS — R1310 Dysphagia, unspecified: Secondary | ICD-10-CM | POA: Diagnosis not present

## 2016-07-15 DIAGNOSIS — K5900 Constipation, unspecified: Secondary | ICD-10-CM | POA: Diagnosis not present

## 2016-07-15 DIAGNOSIS — G20 Parkinson's disease: Secondary | ICD-10-CM | POA: Diagnosis not present

## 2016-07-15 DIAGNOSIS — F329 Major depressive disorder, single episode, unspecified: Secondary | ICD-10-CM | POA: Diagnosis not present

## 2016-07-15 DIAGNOSIS — I10 Essential (primary) hypertension: Secondary | ICD-10-CM | POA: Diagnosis not present

## 2016-07-15 DIAGNOSIS — M545 Low back pain: Secondary | ICD-10-CM | POA: Diagnosis not present

## 2016-07-15 DIAGNOSIS — R109 Unspecified abdominal pain: Secondary | ICD-10-CM | POA: Diagnosis not present

## 2016-07-15 DIAGNOSIS — G309 Alzheimer's disease, unspecified: Secondary | ICD-10-CM | POA: Diagnosis not present

## 2016-07-15 DIAGNOSIS — R269 Unspecified abnormalities of gait and mobility: Secondary | ICD-10-CM | POA: Diagnosis not present

## 2016-07-15 DIAGNOSIS — R410 Disorientation, unspecified: Secondary | ICD-10-CM | POA: Diagnosis not present

## 2016-07-16 DIAGNOSIS — Z79899 Other long term (current) drug therapy: Secondary | ICD-10-CM | POA: Diagnosis not present

## 2016-07-16 DIAGNOSIS — G20 Parkinson's disease: Secondary | ICD-10-CM | POA: Diagnosis not present

## 2016-07-16 DIAGNOSIS — I10 Essential (primary) hypertension: Secondary | ICD-10-CM | POA: Diagnosis not present

## 2016-07-16 DIAGNOSIS — R1310 Dysphagia, unspecified: Secondary | ICD-10-CM | POA: Diagnosis not present

## 2016-07-16 DIAGNOSIS — F329 Major depressive disorder, single episode, unspecified: Secondary | ICD-10-CM | POA: Diagnosis not present

## 2016-07-19 DIAGNOSIS — R1310 Dysphagia, unspecified: Secondary | ICD-10-CM | POA: Diagnosis not present

## 2016-07-19 DIAGNOSIS — F329 Major depressive disorder, single episode, unspecified: Secondary | ICD-10-CM | POA: Diagnosis not present

## 2016-07-19 DIAGNOSIS — I10 Essential (primary) hypertension: Secondary | ICD-10-CM | POA: Diagnosis not present

## 2016-07-19 DIAGNOSIS — G20 Parkinson's disease: Secondary | ICD-10-CM | POA: Diagnosis not present

## 2016-07-20 DIAGNOSIS — G20 Parkinson's disease: Secondary | ICD-10-CM | POA: Diagnosis not present

## 2016-07-20 DIAGNOSIS — F329 Major depressive disorder, single episode, unspecified: Secondary | ICD-10-CM | POA: Diagnosis not present

## 2016-07-20 DIAGNOSIS — Z66 Do not resuscitate: Secondary | ICD-10-CM | POA: Diagnosis not present

## 2016-07-20 DIAGNOSIS — I10 Essential (primary) hypertension: Secondary | ICD-10-CM | POA: Diagnosis not present

## 2016-07-20 DIAGNOSIS — M6281 Muscle weakness (generalized): Secondary | ICD-10-CM | POA: Diagnosis not present

## 2016-07-20 DIAGNOSIS — R1310 Dysphagia, unspecified: Secondary | ICD-10-CM | POA: Diagnosis not present

## 2016-07-20 DIAGNOSIS — J3089 Other allergic rhinitis: Secondary | ICD-10-CM | POA: Diagnosis not present

## 2016-07-20 DIAGNOSIS — R5381 Other malaise: Secondary | ICD-10-CM | POA: Diagnosis not present

## 2016-07-20 DIAGNOSIS — Z79899 Other long term (current) drug therapy: Secondary | ICD-10-CM | POA: Diagnosis not present

## 2016-07-21 DIAGNOSIS — R1310 Dysphagia, unspecified: Secondary | ICD-10-CM | POA: Diagnosis not present

## 2016-07-21 DIAGNOSIS — F329 Major depressive disorder, single episode, unspecified: Secondary | ICD-10-CM | POA: Diagnosis not present

## 2016-07-21 DIAGNOSIS — G20 Parkinson's disease: Secondary | ICD-10-CM | POA: Diagnosis not present

## 2016-07-21 DIAGNOSIS — I10 Essential (primary) hypertension: Secondary | ICD-10-CM | POA: Diagnosis not present

## 2016-07-22 DIAGNOSIS — F329 Major depressive disorder, single episode, unspecified: Secondary | ICD-10-CM | POA: Diagnosis not present

## 2016-07-22 DIAGNOSIS — R1310 Dysphagia, unspecified: Secondary | ICD-10-CM | POA: Diagnosis not present

## 2016-07-22 DIAGNOSIS — I10 Essential (primary) hypertension: Secondary | ICD-10-CM | POA: Diagnosis not present

## 2016-07-22 DIAGNOSIS — G20 Parkinson's disease: Secondary | ICD-10-CM | POA: Diagnosis not present

## 2016-07-23 DIAGNOSIS — R1310 Dysphagia, unspecified: Secondary | ICD-10-CM | POA: Diagnosis not present

## 2016-07-23 DIAGNOSIS — I10 Essential (primary) hypertension: Secondary | ICD-10-CM | POA: Diagnosis not present

## 2016-07-23 DIAGNOSIS — G20 Parkinson's disease: Secondary | ICD-10-CM | POA: Diagnosis not present

## 2016-07-23 DIAGNOSIS — F329 Major depressive disorder, single episode, unspecified: Secondary | ICD-10-CM | POA: Diagnosis not present

## 2016-07-26 DIAGNOSIS — R1310 Dysphagia, unspecified: Secondary | ICD-10-CM | POA: Diagnosis not present

## 2016-07-26 DIAGNOSIS — F329 Major depressive disorder, single episode, unspecified: Secondary | ICD-10-CM | POA: Diagnosis not present

## 2016-07-26 DIAGNOSIS — G20 Parkinson's disease: Secondary | ICD-10-CM | POA: Diagnosis not present

## 2016-07-26 DIAGNOSIS — I10 Essential (primary) hypertension: Secondary | ICD-10-CM | POA: Diagnosis not present

## 2016-07-27 DIAGNOSIS — R1310 Dysphagia, unspecified: Secondary | ICD-10-CM | POA: Diagnosis not present

## 2016-07-27 DIAGNOSIS — G20 Parkinson's disease: Secondary | ICD-10-CM | POA: Diagnosis not present

## 2016-07-27 DIAGNOSIS — F329 Major depressive disorder, single episode, unspecified: Secondary | ICD-10-CM | POA: Diagnosis not present

## 2016-07-27 DIAGNOSIS — I10 Essential (primary) hypertension: Secondary | ICD-10-CM | POA: Diagnosis not present

## 2016-07-28 DIAGNOSIS — G20 Parkinson's disease: Secondary | ICD-10-CM | POA: Diagnosis not present

## 2016-07-28 DIAGNOSIS — I10 Essential (primary) hypertension: Secondary | ICD-10-CM | POA: Diagnosis not present

## 2016-07-28 DIAGNOSIS — F329 Major depressive disorder, single episode, unspecified: Secondary | ICD-10-CM | POA: Diagnosis not present

## 2016-07-28 DIAGNOSIS — R1310 Dysphagia, unspecified: Secondary | ICD-10-CM | POA: Diagnosis not present

## 2016-07-29 DIAGNOSIS — F329 Major depressive disorder, single episode, unspecified: Secondary | ICD-10-CM | POA: Diagnosis not present

## 2016-07-29 DIAGNOSIS — G20 Parkinson's disease: Secondary | ICD-10-CM | POA: Diagnosis not present

## 2016-07-29 DIAGNOSIS — I10 Essential (primary) hypertension: Secondary | ICD-10-CM | POA: Diagnosis not present

## 2016-07-29 DIAGNOSIS — R1310 Dysphagia, unspecified: Secondary | ICD-10-CM | POA: Diagnosis not present

## 2016-07-30 DIAGNOSIS — R1310 Dysphagia, unspecified: Secondary | ICD-10-CM | POA: Diagnosis not present

## 2016-07-30 DIAGNOSIS — I10 Essential (primary) hypertension: Secondary | ICD-10-CM | POA: Diagnosis not present

## 2016-07-30 DIAGNOSIS — G20 Parkinson's disease: Secondary | ICD-10-CM | POA: Diagnosis not present

## 2016-07-30 DIAGNOSIS — F329 Major depressive disorder, single episode, unspecified: Secondary | ICD-10-CM | POA: Diagnosis not present

## 2016-08-02 DIAGNOSIS — R1310 Dysphagia, unspecified: Secondary | ICD-10-CM | POA: Diagnosis not present

## 2016-08-02 DIAGNOSIS — I10 Essential (primary) hypertension: Secondary | ICD-10-CM | POA: Diagnosis not present

## 2016-08-02 DIAGNOSIS — G20 Parkinson's disease: Secondary | ICD-10-CM | POA: Diagnosis not present

## 2016-08-02 DIAGNOSIS — F329 Major depressive disorder, single episode, unspecified: Secondary | ICD-10-CM | POA: Diagnosis not present

## 2016-08-03 DIAGNOSIS — I10 Essential (primary) hypertension: Secondary | ICD-10-CM | POA: Diagnosis not present

## 2016-08-03 DIAGNOSIS — F329 Major depressive disorder, single episode, unspecified: Secondary | ICD-10-CM | POA: Diagnosis not present

## 2016-08-03 DIAGNOSIS — G20 Parkinson's disease: Secondary | ICD-10-CM | POA: Diagnosis not present

## 2016-08-03 DIAGNOSIS — R1310 Dysphagia, unspecified: Secondary | ICD-10-CM | POA: Diagnosis not present

## 2016-08-04 DIAGNOSIS — R1310 Dysphagia, unspecified: Secondary | ICD-10-CM | POA: Diagnosis not present

## 2016-08-04 DIAGNOSIS — I10 Essential (primary) hypertension: Secondary | ICD-10-CM | POA: Diagnosis not present

## 2016-08-04 DIAGNOSIS — F329 Major depressive disorder, single episode, unspecified: Secondary | ICD-10-CM | POA: Diagnosis not present

## 2016-08-04 DIAGNOSIS — G20 Parkinson's disease: Secondary | ICD-10-CM | POA: Diagnosis not present

## 2016-08-05 DIAGNOSIS — G20 Parkinson's disease: Secondary | ICD-10-CM | POA: Diagnosis not present

## 2016-08-05 DIAGNOSIS — R1310 Dysphagia, unspecified: Secondary | ICD-10-CM | POA: Diagnosis not present

## 2016-08-05 DIAGNOSIS — I10 Essential (primary) hypertension: Secondary | ICD-10-CM | POA: Diagnosis not present

## 2016-08-05 DIAGNOSIS — F329 Major depressive disorder, single episode, unspecified: Secondary | ICD-10-CM | POA: Diagnosis not present

## 2016-08-06 DIAGNOSIS — R1310 Dysphagia, unspecified: Secondary | ICD-10-CM | POA: Diagnosis not present

## 2016-08-06 DIAGNOSIS — I10 Essential (primary) hypertension: Secondary | ICD-10-CM | POA: Diagnosis not present

## 2016-08-06 DIAGNOSIS — F329 Major depressive disorder, single episode, unspecified: Secondary | ICD-10-CM | POA: Diagnosis not present

## 2016-08-06 DIAGNOSIS — G20 Parkinson's disease: Secondary | ICD-10-CM | POA: Diagnosis not present

## 2016-08-09 DIAGNOSIS — F329 Major depressive disorder, single episode, unspecified: Secondary | ICD-10-CM | POA: Diagnosis not present

## 2016-08-09 DIAGNOSIS — G20 Parkinson's disease: Secondary | ICD-10-CM | POA: Diagnosis not present

## 2016-08-09 DIAGNOSIS — I10 Essential (primary) hypertension: Secondary | ICD-10-CM | POA: Diagnosis not present

## 2016-08-09 DIAGNOSIS — R1310 Dysphagia, unspecified: Secondary | ICD-10-CM | POA: Diagnosis not present

## 2016-08-10 DIAGNOSIS — R1310 Dysphagia, unspecified: Secondary | ICD-10-CM | POA: Diagnosis not present

## 2016-08-10 DIAGNOSIS — G20 Parkinson's disease: Secondary | ICD-10-CM | POA: Diagnosis not present

## 2016-08-10 DIAGNOSIS — F329 Major depressive disorder, single episode, unspecified: Secondary | ICD-10-CM | POA: Diagnosis not present

## 2016-08-10 DIAGNOSIS — I10 Essential (primary) hypertension: Secondary | ICD-10-CM | POA: Diagnosis not present

## 2016-08-11 DIAGNOSIS — R1310 Dysphagia, unspecified: Secondary | ICD-10-CM | POA: Diagnosis not present

## 2016-08-11 DIAGNOSIS — F329 Major depressive disorder, single episode, unspecified: Secondary | ICD-10-CM | POA: Diagnosis not present

## 2016-08-11 DIAGNOSIS — I10 Essential (primary) hypertension: Secondary | ICD-10-CM | POA: Diagnosis not present

## 2016-08-11 DIAGNOSIS — G20 Parkinson's disease: Secondary | ICD-10-CM | POA: Diagnosis not present

## 2016-08-12 DIAGNOSIS — F329 Major depressive disorder, single episode, unspecified: Secondary | ICD-10-CM | POA: Diagnosis not present

## 2016-08-12 DIAGNOSIS — G20 Parkinson's disease: Secondary | ICD-10-CM | POA: Diagnosis not present

## 2016-08-12 DIAGNOSIS — R1310 Dysphagia, unspecified: Secondary | ICD-10-CM | POA: Diagnosis not present

## 2016-08-12 DIAGNOSIS — I10 Essential (primary) hypertension: Secondary | ICD-10-CM | POA: Diagnosis not present

## 2016-08-13 DIAGNOSIS — F329 Major depressive disorder, single episode, unspecified: Secondary | ICD-10-CM | POA: Diagnosis not present

## 2016-08-13 DIAGNOSIS — R1310 Dysphagia, unspecified: Secondary | ICD-10-CM | POA: Diagnosis not present

## 2016-08-13 DIAGNOSIS — I10 Essential (primary) hypertension: Secondary | ICD-10-CM | POA: Diagnosis not present

## 2016-08-13 DIAGNOSIS — G20 Parkinson's disease: Secondary | ICD-10-CM | POA: Diagnosis not present

## 2016-08-16 DIAGNOSIS — F329 Major depressive disorder, single episode, unspecified: Secondary | ICD-10-CM | POA: Diagnosis not present

## 2016-08-16 DIAGNOSIS — I10 Essential (primary) hypertension: Secondary | ICD-10-CM | POA: Diagnosis not present

## 2016-08-16 DIAGNOSIS — R1310 Dysphagia, unspecified: Secondary | ICD-10-CM | POA: Diagnosis not present

## 2016-08-16 DIAGNOSIS — G20 Parkinson's disease: Secondary | ICD-10-CM | POA: Diagnosis not present

## 2016-08-17 DIAGNOSIS — I10 Essential (primary) hypertension: Secondary | ICD-10-CM | POA: Diagnosis not present

## 2016-08-17 DIAGNOSIS — R1310 Dysphagia, unspecified: Secondary | ICD-10-CM | POA: Diagnosis not present

## 2016-08-17 DIAGNOSIS — F329 Major depressive disorder, single episode, unspecified: Secondary | ICD-10-CM | POA: Diagnosis not present

## 2016-08-17 DIAGNOSIS — G20 Parkinson's disease: Secondary | ICD-10-CM | POA: Diagnosis not present

## 2016-08-18 DIAGNOSIS — F329 Major depressive disorder, single episode, unspecified: Secondary | ICD-10-CM | POA: Diagnosis not present

## 2016-08-18 DIAGNOSIS — I10 Essential (primary) hypertension: Secondary | ICD-10-CM | POA: Diagnosis not present

## 2016-08-18 DIAGNOSIS — G20 Parkinson's disease: Secondary | ICD-10-CM | POA: Diagnosis not present

## 2016-08-18 DIAGNOSIS — R1310 Dysphagia, unspecified: Secondary | ICD-10-CM | POA: Diagnosis not present

## 2016-08-19 DIAGNOSIS — F329 Major depressive disorder, single episode, unspecified: Secondary | ICD-10-CM | POA: Diagnosis not present

## 2016-08-19 DIAGNOSIS — I10 Essential (primary) hypertension: Secondary | ICD-10-CM | POA: Diagnosis not present

## 2016-08-19 DIAGNOSIS — G20 Parkinson's disease: Secondary | ICD-10-CM | POA: Diagnosis not present

## 2016-08-19 DIAGNOSIS — R1310 Dysphagia, unspecified: Secondary | ICD-10-CM | POA: Diagnosis not present

## 2016-08-20 DIAGNOSIS — I10 Essential (primary) hypertension: Secondary | ICD-10-CM | POA: Diagnosis not present

## 2016-08-20 DIAGNOSIS — R1310 Dysphagia, unspecified: Secondary | ICD-10-CM | POA: Diagnosis not present

## 2016-08-20 DIAGNOSIS — F329 Major depressive disorder, single episode, unspecified: Secondary | ICD-10-CM | POA: Diagnosis not present

## 2016-08-20 DIAGNOSIS — G20 Parkinson's disease: Secondary | ICD-10-CM | POA: Diagnosis not present

## 2016-08-23 DIAGNOSIS — R1310 Dysphagia, unspecified: Secondary | ICD-10-CM | POA: Diagnosis not present

## 2016-08-23 DIAGNOSIS — F329 Major depressive disorder, single episode, unspecified: Secondary | ICD-10-CM | POA: Diagnosis not present

## 2016-08-23 DIAGNOSIS — G20 Parkinson's disease: Secondary | ICD-10-CM | POA: Diagnosis not present

## 2016-08-23 DIAGNOSIS — I10 Essential (primary) hypertension: Secondary | ICD-10-CM | POA: Diagnosis not present

## 2016-08-24 DIAGNOSIS — B351 Tinea unguium: Secondary | ICD-10-CM | POA: Diagnosis not present

## 2016-08-24 DIAGNOSIS — M79673 Pain in unspecified foot: Secondary | ICD-10-CM | POA: Diagnosis not present

## 2016-08-24 DIAGNOSIS — L600 Ingrowing nail: Secondary | ICD-10-CM | POA: Diagnosis not present

## 2016-08-24 DIAGNOSIS — F329 Major depressive disorder, single episode, unspecified: Secondary | ICD-10-CM | POA: Diagnosis not present

## 2016-08-24 DIAGNOSIS — R1310 Dysphagia, unspecified: Secondary | ICD-10-CM | POA: Diagnosis not present

## 2016-08-24 DIAGNOSIS — R262 Difficulty in walking, not elsewhere classified: Secondary | ICD-10-CM | POA: Diagnosis not present

## 2016-08-24 DIAGNOSIS — I10 Essential (primary) hypertension: Secondary | ICD-10-CM | POA: Diagnosis not present

## 2016-08-24 DIAGNOSIS — G20 Parkinson's disease: Secondary | ICD-10-CM | POA: Diagnosis not present

## 2016-08-25 DIAGNOSIS — F329 Major depressive disorder, single episode, unspecified: Secondary | ICD-10-CM | POA: Diagnosis not present

## 2016-08-25 DIAGNOSIS — R1310 Dysphagia, unspecified: Secondary | ICD-10-CM | POA: Diagnosis not present

## 2016-08-25 DIAGNOSIS — I10 Essential (primary) hypertension: Secondary | ICD-10-CM | POA: Diagnosis not present

## 2016-08-25 DIAGNOSIS — G20 Parkinson's disease: Secondary | ICD-10-CM | POA: Diagnosis not present

## 2016-08-26 DIAGNOSIS — R1310 Dysphagia, unspecified: Secondary | ICD-10-CM | POA: Diagnosis not present

## 2016-08-26 DIAGNOSIS — G20 Parkinson's disease: Secondary | ICD-10-CM | POA: Diagnosis not present

## 2016-08-26 DIAGNOSIS — F329 Major depressive disorder, single episode, unspecified: Secondary | ICD-10-CM | POA: Diagnosis not present

## 2016-08-26 DIAGNOSIS — I10 Essential (primary) hypertension: Secondary | ICD-10-CM | POA: Diagnosis not present

## 2016-08-27 DIAGNOSIS — I10 Essential (primary) hypertension: Secondary | ICD-10-CM | POA: Diagnosis not present

## 2016-08-27 DIAGNOSIS — G20 Parkinson's disease: Secondary | ICD-10-CM | POA: Diagnosis not present

## 2016-08-27 DIAGNOSIS — R1310 Dysphagia, unspecified: Secondary | ICD-10-CM | POA: Diagnosis not present

## 2016-08-27 DIAGNOSIS — F329 Major depressive disorder, single episode, unspecified: Secondary | ICD-10-CM | POA: Diagnosis not present

## 2016-08-30 DIAGNOSIS — I10 Essential (primary) hypertension: Secondary | ICD-10-CM | POA: Diagnosis not present

## 2016-08-30 DIAGNOSIS — G20 Parkinson's disease: Secondary | ICD-10-CM | POA: Diagnosis not present

## 2016-08-30 DIAGNOSIS — F329 Major depressive disorder, single episode, unspecified: Secondary | ICD-10-CM | POA: Diagnosis not present

## 2016-08-30 DIAGNOSIS — R1310 Dysphagia, unspecified: Secondary | ICD-10-CM | POA: Diagnosis not present

## 2016-08-31 DIAGNOSIS — I10 Essential (primary) hypertension: Secondary | ICD-10-CM | POA: Diagnosis not present

## 2016-08-31 DIAGNOSIS — G20 Parkinson's disease: Secondary | ICD-10-CM | POA: Diagnosis not present

## 2016-08-31 DIAGNOSIS — F329 Major depressive disorder, single episode, unspecified: Secondary | ICD-10-CM | POA: Diagnosis not present

## 2016-08-31 DIAGNOSIS — R1310 Dysphagia, unspecified: Secondary | ICD-10-CM | POA: Diagnosis not present

## 2016-09-01 DIAGNOSIS — G20 Parkinson's disease: Secondary | ICD-10-CM | POA: Diagnosis not present

## 2016-09-01 DIAGNOSIS — I10 Essential (primary) hypertension: Secondary | ICD-10-CM | POA: Diagnosis not present

## 2016-09-01 DIAGNOSIS — R1310 Dysphagia, unspecified: Secondary | ICD-10-CM | POA: Diagnosis not present

## 2016-09-01 DIAGNOSIS — F329 Major depressive disorder, single episode, unspecified: Secondary | ICD-10-CM | POA: Diagnosis not present

## 2016-09-02 DIAGNOSIS — Z79899 Other long term (current) drug therapy: Secondary | ICD-10-CM | POA: Diagnosis not present

## 2016-09-02 DIAGNOSIS — M545 Low back pain: Secondary | ICD-10-CM | POA: Diagnosis not present

## 2016-09-02 DIAGNOSIS — G20 Parkinson's disease: Secondary | ICD-10-CM | POA: Diagnosis not present

## 2016-09-02 DIAGNOSIS — R269 Unspecified abnormalities of gait and mobility: Secondary | ICD-10-CM | POA: Diagnosis not present

## 2016-09-02 DIAGNOSIS — R410 Disorientation, unspecified: Secondary | ICD-10-CM | POA: Diagnosis not present

## 2016-09-02 DIAGNOSIS — L539 Erythematous condition, unspecified: Secondary | ICD-10-CM | POA: Diagnosis not present

## 2016-09-02 DIAGNOSIS — I10 Essential (primary) hypertension: Secondary | ICD-10-CM | POA: Diagnosis not present

## 2016-09-02 DIAGNOSIS — F329 Major depressive disorder, single episode, unspecified: Secondary | ICD-10-CM | POA: Diagnosis not present

## 2016-09-02 DIAGNOSIS — R1310 Dysphagia, unspecified: Secondary | ICD-10-CM | POA: Diagnosis not present

## 2016-09-02 DIAGNOSIS — K5900 Constipation, unspecified: Secondary | ICD-10-CM | POA: Diagnosis not present

## 2016-09-03 DIAGNOSIS — F329 Major depressive disorder, single episode, unspecified: Secondary | ICD-10-CM | POA: Diagnosis not present

## 2016-09-03 DIAGNOSIS — G20 Parkinson's disease: Secondary | ICD-10-CM | POA: Diagnosis not present

## 2016-09-03 DIAGNOSIS — R1310 Dysphagia, unspecified: Secondary | ICD-10-CM | POA: Diagnosis not present

## 2016-09-03 DIAGNOSIS — I10 Essential (primary) hypertension: Secondary | ICD-10-CM | POA: Diagnosis not present

## 2016-09-06 DIAGNOSIS — I10 Essential (primary) hypertension: Secondary | ICD-10-CM | POA: Diagnosis not present

## 2016-09-06 DIAGNOSIS — F329 Major depressive disorder, single episode, unspecified: Secondary | ICD-10-CM | POA: Diagnosis not present

## 2016-09-06 DIAGNOSIS — G20 Parkinson's disease: Secondary | ICD-10-CM | POA: Diagnosis not present

## 2016-09-06 DIAGNOSIS — R1310 Dysphagia, unspecified: Secondary | ICD-10-CM | POA: Diagnosis not present

## 2016-09-07 DIAGNOSIS — I10 Essential (primary) hypertension: Secondary | ICD-10-CM | POA: Diagnosis not present

## 2016-09-07 DIAGNOSIS — R1310 Dysphagia, unspecified: Secondary | ICD-10-CM | POA: Diagnosis not present

## 2016-09-07 DIAGNOSIS — G20 Parkinson's disease: Secondary | ICD-10-CM | POA: Diagnosis not present

## 2016-09-07 DIAGNOSIS — F329 Major depressive disorder, single episode, unspecified: Secondary | ICD-10-CM | POA: Diagnosis not present

## 2016-09-08 DIAGNOSIS — G20 Parkinson's disease: Secondary | ICD-10-CM | POA: Diagnosis not present

## 2016-09-08 DIAGNOSIS — R1310 Dysphagia, unspecified: Secondary | ICD-10-CM | POA: Diagnosis not present

## 2016-09-08 DIAGNOSIS — Z79899 Other long term (current) drug therapy: Secondary | ICD-10-CM | POA: Diagnosis not present

## 2016-09-08 DIAGNOSIS — F329 Major depressive disorder, single episode, unspecified: Secondary | ICD-10-CM | POA: Diagnosis not present

## 2016-09-08 DIAGNOSIS — I10 Essential (primary) hypertension: Secondary | ICD-10-CM | POA: Diagnosis not present

## 2016-09-09 DIAGNOSIS — R350 Frequency of micturition: Secondary | ICD-10-CM | POA: Diagnosis not present

## 2016-09-09 DIAGNOSIS — R1310 Dysphagia, unspecified: Secondary | ICD-10-CM | POA: Diagnosis not present

## 2016-09-09 DIAGNOSIS — G20 Parkinson's disease: Secondary | ICD-10-CM | POA: Diagnosis not present

## 2016-09-09 DIAGNOSIS — F329 Major depressive disorder, single episode, unspecified: Secondary | ICD-10-CM | POA: Diagnosis not present

## 2016-09-09 DIAGNOSIS — I10 Essential (primary) hypertension: Secondary | ICD-10-CM | POA: Diagnosis not present

## 2016-09-09 DIAGNOSIS — R3915 Urgency of urination: Secondary | ICD-10-CM | POA: Diagnosis not present

## 2016-09-10 DIAGNOSIS — G20 Parkinson's disease: Secondary | ICD-10-CM | POA: Diagnosis not present

## 2016-09-10 DIAGNOSIS — I10 Essential (primary) hypertension: Secondary | ICD-10-CM | POA: Diagnosis not present

## 2016-09-10 DIAGNOSIS — R1310 Dysphagia, unspecified: Secondary | ICD-10-CM | POA: Diagnosis not present

## 2016-09-10 DIAGNOSIS — F329 Major depressive disorder, single episode, unspecified: Secondary | ICD-10-CM | POA: Diagnosis not present

## 2016-09-10 DIAGNOSIS — R634 Abnormal weight loss: Secondary | ICD-10-CM | POA: Diagnosis not present

## 2016-09-11 DIAGNOSIS — R634 Abnormal weight loss: Secondary | ICD-10-CM | POA: Diagnosis not present

## 2016-09-11 DIAGNOSIS — I10 Essential (primary) hypertension: Secondary | ICD-10-CM | POA: Diagnosis not present

## 2016-09-11 DIAGNOSIS — R1310 Dysphagia, unspecified: Secondary | ICD-10-CM | POA: Diagnosis not present

## 2016-09-11 DIAGNOSIS — G20 Parkinson's disease: Secondary | ICD-10-CM | POA: Diagnosis not present

## 2016-09-11 DIAGNOSIS — F329 Major depressive disorder, single episode, unspecified: Secondary | ICD-10-CM | POA: Diagnosis not present

## 2016-09-12 DIAGNOSIS — F329 Major depressive disorder, single episode, unspecified: Secondary | ICD-10-CM | POA: Diagnosis not present

## 2016-09-12 DIAGNOSIS — I10 Essential (primary) hypertension: Secondary | ICD-10-CM | POA: Diagnosis not present

## 2016-09-12 DIAGNOSIS — R634 Abnormal weight loss: Secondary | ICD-10-CM | POA: Diagnosis not present

## 2016-09-12 DIAGNOSIS — R1310 Dysphagia, unspecified: Secondary | ICD-10-CM | POA: Diagnosis not present

## 2016-09-12 DIAGNOSIS — G20 Parkinson's disease: Secondary | ICD-10-CM | POA: Diagnosis not present

## 2016-09-13 DIAGNOSIS — I10 Essential (primary) hypertension: Secondary | ICD-10-CM | POA: Diagnosis not present

## 2016-09-13 DIAGNOSIS — G20 Parkinson's disease: Secondary | ICD-10-CM | POA: Diagnosis not present

## 2016-09-13 DIAGNOSIS — R634 Abnormal weight loss: Secondary | ICD-10-CM | POA: Diagnosis not present

## 2016-09-13 DIAGNOSIS — R1310 Dysphagia, unspecified: Secondary | ICD-10-CM | POA: Diagnosis not present

## 2016-09-13 DIAGNOSIS — F329 Major depressive disorder, single episode, unspecified: Secondary | ICD-10-CM | POA: Diagnosis not present

## 2016-09-14 DIAGNOSIS — G20 Parkinson's disease: Secondary | ICD-10-CM | POA: Diagnosis not present

## 2016-09-14 DIAGNOSIS — R1310 Dysphagia, unspecified: Secondary | ICD-10-CM | POA: Diagnosis not present

## 2016-09-14 DIAGNOSIS — F329 Major depressive disorder, single episode, unspecified: Secondary | ICD-10-CM | POA: Diagnosis not present

## 2016-09-14 DIAGNOSIS — I10 Essential (primary) hypertension: Secondary | ICD-10-CM | POA: Diagnosis not present

## 2016-09-14 DIAGNOSIS — R634 Abnormal weight loss: Secondary | ICD-10-CM | POA: Diagnosis not present

## 2016-09-15 DIAGNOSIS — R634 Abnormal weight loss: Secondary | ICD-10-CM | POA: Diagnosis not present

## 2016-09-15 DIAGNOSIS — R1310 Dysphagia, unspecified: Secondary | ICD-10-CM | POA: Diagnosis not present

## 2016-09-15 DIAGNOSIS — G20 Parkinson's disease: Secondary | ICD-10-CM | POA: Diagnosis not present

## 2016-09-15 DIAGNOSIS — I10 Essential (primary) hypertension: Secondary | ICD-10-CM | POA: Diagnosis not present

## 2016-09-15 DIAGNOSIS — F329 Major depressive disorder, single episode, unspecified: Secondary | ICD-10-CM | POA: Diagnosis not present

## 2016-09-16 DIAGNOSIS — F329 Major depressive disorder, single episode, unspecified: Secondary | ICD-10-CM | POA: Diagnosis not present

## 2016-09-16 DIAGNOSIS — G20 Parkinson's disease: Secondary | ICD-10-CM | POA: Diagnosis not present

## 2016-09-16 DIAGNOSIS — I10 Essential (primary) hypertension: Secondary | ICD-10-CM | POA: Diagnosis not present

## 2016-09-16 DIAGNOSIS — R1310 Dysphagia, unspecified: Secondary | ICD-10-CM | POA: Diagnosis not present

## 2016-09-16 DIAGNOSIS — R634 Abnormal weight loss: Secondary | ICD-10-CM | POA: Diagnosis not present

## 2016-09-17 DIAGNOSIS — G20 Parkinson's disease: Secondary | ICD-10-CM | POA: Diagnosis not present

## 2016-09-17 DIAGNOSIS — F329 Major depressive disorder, single episode, unspecified: Secondary | ICD-10-CM | POA: Diagnosis not present

## 2016-09-17 DIAGNOSIS — R1310 Dysphagia, unspecified: Secondary | ICD-10-CM | POA: Diagnosis not present

## 2016-09-17 DIAGNOSIS — I10 Essential (primary) hypertension: Secondary | ICD-10-CM | POA: Diagnosis not present

## 2016-09-17 DIAGNOSIS — R634 Abnormal weight loss: Secondary | ICD-10-CM | POA: Diagnosis not present

## 2016-09-20 DIAGNOSIS — R634 Abnormal weight loss: Secondary | ICD-10-CM | POA: Diagnosis not present

## 2016-09-20 DIAGNOSIS — R1310 Dysphagia, unspecified: Secondary | ICD-10-CM | POA: Diagnosis not present

## 2016-09-20 DIAGNOSIS — G20 Parkinson's disease: Secondary | ICD-10-CM | POA: Diagnosis not present

## 2016-09-20 DIAGNOSIS — F329 Major depressive disorder, single episode, unspecified: Secondary | ICD-10-CM | POA: Diagnosis not present

## 2016-09-20 DIAGNOSIS — I10 Essential (primary) hypertension: Secondary | ICD-10-CM | POA: Diagnosis not present

## 2016-09-21 DIAGNOSIS — G20 Parkinson's disease: Secondary | ICD-10-CM | POA: Diagnosis not present

## 2016-09-21 DIAGNOSIS — R634 Abnormal weight loss: Secondary | ICD-10-CM | POA: Diagnosis not present

## 2016-09-21 DIAGNOSIS — F329 Major depressive disorder, single episode, unspecified: Secondary | ICD-10-CM | POA: Diagnosis not present

## 2016-09-21 DIAGNOSIS — R1310 Dysphagia, unspecified: Secondary | ICD-10-CM | POA: Diagnosis not present

## 2016-09-21 DIAGNOSIS — I10 Essential (primary) hypertension: Secondary | ICD-10-CM | POA: Diagnosis not present

## 2016-09-22 DIAGNOSIS — I10 Essential (primary) hypertension: Secondary | ICD-10-CM | POA: Diagnosis not present

## 2016-09-22 DIAGNOSIS — G20 Parkinson's disease: Secondary | ICD-10-CM | POA: Diagnosis not present

## 2016-09-22 DIAGNOSIS — R1310 Dysphagia, unspecified: Secondary | ICD-10-CM | POA: Diagnosis not present

## 2016-09-22 DIAGNOSIS — F329 Major depressive disorder, single episode, unspecified: Secondary | ICD-10-CM | POA: Diagnosis not present

## 2016-09-22 DIAGNOSIS — R634 Abnormal weight loss: Secondary | ICD-10-CM | POA: Diagnosis not present

## 2016-09-23 DIAGNOSIS — I10 Essential (primary) hypertension: Secondary | ICD-10-CM | POA: Diagnosis not present

## 2016-09-23 DIAGNOSIS — R634 Abnormal weight loss: Secondary | ICD-10-CM | POA: Diagnosis not present

## 2016-09-23 DIAGNOSIS — R1310 Dysphagia, unspecified: Secondary | ICD-10-CM | POA: Diagnosis not present

## 2016-09-23 DIAGNOSIS — F329 Major depressive disorder, single episode, unspecified: Secondary | ICD-10-CM | POA: Diagnosis not present

## 2016-09-23 DIAGNOSIS — G20 Parkinson's disease: Secondary | ICD-10-CM | POA: Diagnosis not present

## 2016-09-24 DIAGNOSIS — G20 Parkinson's disease: Secondary | ICD-10-CM | POA: Diagnosis not present

## 2016-09-24 DIAGNOSIS — R1310 Dysphagia, unspecified: Secondary | ICD-10-CM | POA: Diagnosis not present

## 2016-09-24 DIAGNOSIS — F329 Major depressive disorder, single episode, unspecified: Secondary | ICD-10-CM | POA: Diagnosis not present

## 2016-09-24 DIAGNOSIS — I10 Essential (primary) hypertension: Secondary | ICD-10-CM | POA: Diagnosis not present

## 2016-09-24 DIAGNOSIS — R634 Abnormal weight loss: Secondary | ICD-10-CM | POA: Diagnosis not present

## 2016-09-27 DIAGNOSIS — I10 Essential (primary) hypertension: Secondary | ICD-10-CM | POA: Diagnosis not present

## 2016-09-27 DIAGNOSIS — R634 Abnormal weight loss: Secondary | ICD-10-CM | POA: Diagnosis not present

## 2016-09-27 DIAGNOSIS — F329 Major depressive disorder, single episode, unspecified: Secondary | ICD-10-CM | POA: Diagnosis not present

## 2016-09-27 DIAGNOSIS — R1310 Dysphagia, unspecified: Secondary | ICD-10-CM | POA: Diagnosis not present

## 2016-09-27 DIAGNOSIS — G20 Parkinson's disease: Secondary | ICD-10-CM | POA: Diagnosis not present

## 2016-09-28 DIAGNOSIS — G20 Parkinson's disease: Secondary | ICD-10-CM | POA: Diagnosis not present

## 2016-09-28 DIAGNOSIS — F329 Major depressive disorder, single episode, unspecified: Secondary | ICD-10-CM | POA: Diagnosis not present

## 2016-09-28 DIAGNOSIS — R1310 Dysphagia, unspecified: Secondary | ICD-10-CM | POA: Diagnosis not present

## 2016-09-28 DIAGNOSIS — I10 Essential (primary) hypertension: Secondary | ICD-10-CM | POA: Diagnosis not present

## 2016-09-28 DIAGNOSIS — R634 Abnormal weight loss: Secondary | ICD-10-CM | POA: Diagnosis not present

## 2016-09-29 DIAGNOSIS — G20 Parkinson's disease: Secondary | ICD-10-CM | POA: Diagnosis not present

## 2016-09-29 DIAGNOSIS — R634 Abnormal weight loss: Secondary | ICD-10-CM | POA: Diagnosis not present

## 2016-09-29 DIAGNOSIS — F329 Major depressive disorder, single episode, unspecified: Secondary | ICD-10-CM | POA: Diagnosis not present

## 2016-09-29 DIAGNOSIS — R1310 Dysphagia, unspecified: Secondary | ICD-10-CM | POA: Diagnosis not present

## 2016-09-29 DIAGNOSIS — I10 Essential (primary) hypertension: Secondary | ICD-10-CM | POA: Diagnosis not present

## 2016-09-30 DIAGNOSIS — G20 Parkinson's disease: Secondary | ICD-10-CM | POA: Diagnosis not present

## 2016-09-30 DIAGNOSIS — R634 Abnormal weight loss: Secondary | ICD-10-CM | POA: Diagnosis not present

## 2016-09-30 DIAGNOSIS — I10 Essential (primary) hypertension: Secondary | ICD-10-CM | POA: Diagnosis not present

## 2016-09-30 DIAGNOSIS — F329 Major depressive disorder, single episode, unspecified: Secondary | ICD-10-CM | POA: Diagnosis not present

## 2016-09-30 DIAGNOSIS — R1310 Dysphagia, unspecified: Secondary | ICD-10-CM | POA: Diagnosis not present

## 2016-10-01 DIAGNOSIS — G20 Parkinson's disease: Secondary | ICD-10-CM | POA: Diagnosis not present

## 2016-10-01 DIAGNOSIS — R634 Abnormal weight loss: Secondary | ICD-10-CM | POA: Diagnosis not present

## 2016-10-01 DIAGNOSIS — I10 Essential (primary) hypertension: Secondary | ICD-10-CM | POA: Diagnosis not present

## 2016-10-01 DIAGNOSIS — F329 Major depressive disorder, single episode, unspecified: Secondary | ICD-10-CM | POA: Diagnosis not present

## 2016-10-01 DIAGNOSIS — R1310 Dysphagia, unspecified: Secondary | ICD-10-CM | POA: Diagnosis not present

## 2016-10-04 DIAGNOSIS — G20 Parkinson's disease: Secondary | ICD-10-CM | POA: Diagnosis not present

## 2016-10-04 DIAGNOSIS — R1310 Dysphagia, unspecified: Secondary | ICD-10-CM | POA: Diagnosis not present

## 2016-10-04 DIAGNOSIS — R634 Abnormal weight loss: Secondary | ICD-10-CM | POA: Diagnosis not present

## 2016-10-04 DIAGNOSIS — F329 Major depressive disorder, single episode, unspecified: Secondary | ICD-10-CM | POA: Diagnosis not present

## 2016-10-04 DIAGNOSIS — I10 Essential (primary) hypertension: Secondary | ICD-10-CM | POA: Diagnosis not present

## 2016-10-05 DIAGNOSIS — R634 Abnormal weight loss: Secondary | ICD-10-CM | POA: Diagnosis not present

## 2016-10-05 DIAGNOSIS — F329 Major depressive disorder, single episode, unspecified: Secondary | ICD-10-CM | POA: Diagnosis not present

## 2016-10-05 DIAGNOSIS — G20 Parkinson's disease: Secondary | ICD-10-CM | POA: Diagnosis not present

## 2016-10-05 DIAGNOSIS — I10 Essential (primary) hypertension: Secondary | ICD-10-CM | POA: Diagnosis not present

## 2016-10-05 DIAGNOSIS — R1310 Dysphagia, unspecified: Secondary | ICD-10-CM | POA: Diagnosis not present

## 2016-10-06 DIAGNOSIS — F329 Major depressive disorder, single episode, unspecified: Secondary | ICD-10-CM | POA: Diagnosis not present

## 2016-10-06 DIAGNOSIS — I10 Essential (primary) hypertension: Secondary | ICD-10-CM | POA: Diagnosis not present

## 2016-10-06 DIAGNOSIS — R634 Abnormal weight loss: Secondary | ICD-10-CM | POA: Diagnosis not present

## 2016-10-06 DIAGNOSIS — R1310 Dysphagia, unspecified: Secondary | ICD-10-CM | POA: Diagnosis not present

## 2016-10-06 DIAGNOSIS — G20 Parkinson's disease: Secondary | ICD-10-CM | POA: Diagnosis not present

## 2016-10-07 DIAGNOSIS — F329 Major depressive disorder, single episode, unspecified: Secondary | ICD-10-CM | POA: Diagnosis not present

## 2016-10-07 DIAGNOSIS — G20 Parkinson's disease: Secondary | ICD-10-CM | POA: Diagnosis not present

## 2016-10-07 DIAGNOSIS — R1310 Dysphagia, unspecified: Secondary | ICD-10-CM | POA: Diagnosis not present

## 2016-10-07 DIAGNOSIS — I10 Essential (primary) hypertension: Secondary | ICD-10-CM | POA: Diagnosis not present

## 2016-10-07 DIAGNOSIS — R634 Abnormal weight loss: Secondary | ICD-10-CM | POA: Diagnosis not present

## 2016-10-08 DIAGNOSIS — G20 Parkinson's disease: Secondary | ICD-10-CM | POA: Diagnosis not present

## 2016-10-08 DIAGNOSIS — R634 Abnormal weight loss: Secondary | ICD-10-CM | POA: Diagnosis not present

## 2016-10-08 DIAGNOSIS — R1310 Dysphagia, unspecified: Secondary | ICD-10-CM | POA: Diagnosis not present

## 2016-10-08 DIAGNOSIS — F329 Major depressive disorder, single episode, unspecified: Secondary | ICD-10-CM | POA: Diagnosis not present

## 2016-10-08 DIAGNOSIS — I10 Essential (primary) hypertension: Secondary | ICD-10-CM | POA: Diagnosis not present

## 2016-10-10 DIAGNOSIS — R634 Abnormal weight loss: Secondary | ICD-10-CM | POA: Diagnosis not present

## 2016-10-10 DIAGNOSIS — G20 Parkinson's disease: Secondary | ICD-10-CM | POA: Diagnosis not present

## 2016-10-10 DIAGNOSIS — F329 Major depressive disorder, single episode, unspecified: Secondary | ICD-10-CM | POA: Diagnosis not present

## 2016-10-10 DIAGNOSIS — R1310 Dysphagia, unspecified: Secondary | ICD-10-CM | POA: Diagnosis not present

## 2016-10-10 DIAGNOSIS — I10 Essential (primary) hypertension: Secondary | ICD-10-CM | POA: Diagnosis not present

## 2016-10-11 DIAGNOSIS — R1310 Dysphagia, unspecified: Secondary | ICD-10-CM | POA: Diagnosis not present

## 2016-10-11 DIAGNOSIS — G20 Parkinson's disease: Secondary | ICD-10-CM | POA: Diagnosis not present

## 2016-10-11 DIAGNOSIS — R634 Abnormal weight loss: Secondary | ICD-10-CM | POA: Diagnosis not present

## 2016-10-11 DIAGNOSIS — F329 Major depressive disorder, single episode, unspecified: Secondary | ICD-10-CM | POA: Diagnosis not present

## 2016-10-11 DIAGNOSIS — I10 Essential (primary) hypertension: Secondary | ICD-10-CM | POA: Diagnosis not present

## 2016-10-12 DIAGNOSIS — R634 Abnormal weight loss: Secondary | ICD-10-CM | POA: Diagnosis not present

## 2016-10-12 DIAGNOSIS — R1310 Dysphagia, unspecified: Secondary | ICD-10-CM | POA: Diagnosis not present

## 2016-10-12 DIAGNOSIS — G20 Parkinson's disease: Secondary | ICD-10-CM | POA: Diagnosis not present

## 2016-10-12 DIAGNOSIS — F329 Major depressive disorder, single episode, unspecified: Secondary | ICD-10-CM | POA: Diagnosis not present

## 2016-10-12 DIAGNOSIS — I10 Essential (primary) hypertension: Secondary | ICD-10-CM | POA: Diagnosis not present

## 2016-10-13 DIAGNOSIS — I10 Essential (primary) hypertension: Secondary | ICD-10-CM | POA: Diagnosis not present

## 2016-10-13 DIAGNOSIS — G20 Parkinson's disease: Secondary | ICD-10-CM | POA: Diagnosis not present

## 2016-10-13 DIAGNOSIS — R634 Abnormal weight loss: Secondary | ICD-10-CM | POA: Diagnosis not present

## 2016-10-13 DIAGNOSIS — F329 Major depressive disorder, single episode, unspecified: Secondary | ICD-10-CM | POA: Diagnosis not present

## 2016-10-13 DIAGNOSIS — R1310 Dysphagia, unspecified: Secondary | ICD-10-CM | POA: Diagnosis not present

## 2016-10-14 DIAGNOSIS — R1310 Dysphagia, unspecified: Secondary | ICD-10-CM | POA: Diagnosis not present

## 2016-10-14 DIAGNOSIS — F329 Major depressive disorder, single episode, unspecified: Secondary | ICD-10-CM | POA: Diagnosis not present

## 2016-10-14 DIAGNOSIS — I10 Essential (primary) hypertension: Secondary | ICD-10-CM | POA: Diagnosis not present

## 2016-10-14 DIAGNOSIS — R634 Abnormal weight loss: Secondary | ICD-10-CM | POA: Diagnosis not present

## 2016-10-14 DIAGNOSIS — G20 Parkinson's disease: Secondary | ICD-10-CM | POA: Diagnosis not present

## 2016-10-15 DIAGNOSIS — F329 Major depressive disorder, single episode, unspecified: Secondary | ICD-10-CM | POA: Diagnosis not present

## 2016-10-15 DIAGNOSIS — I10 Essential (primary) hypertension: Secondary | ICD-10-CM | POA: Diagnosis not present

## 2016-10-15 DIAGNOSIS — R634 Abnormal weight loss: Secondary | ICD-10-CM | POA: Diagnosis not present

## 2016-10-15 DIAGNOSIS — G20 Parkinson's disease: Secondary | ICD-10-CM | POA: Diagnosis not present

## 2016-10-15 DIAGNOSIS — R1310 Dysphagia, unspecified: Secondary | ICD-10-CM | POA: Diagnosis not present

## 2016-10-18 DIAGNOSIS — R1310 Dysphagia, unspecified: Secondary | ICD-10-CM | POA: Diagnosis not present

## 2016-10-18 DIAGNOSIS — G20 Parkinson's disease: Secondary | ICD-10-CM | POA: Diagnosis not present

## 2016-10-18 DIAGNOSIS — R634 Abnormal weight loss: Secondary | ICD-10-CM | POA: Diagnosis not present

## 2016-10-18 DIAGNOSIS — F329 Major depressive disorder, single episode, unspecified: Secondary | ICD-10-CM | POA: Diagnosis not present

## 2016-10-18 DIAGNOSIS — I10 Essential (primary) hypertension: Secondary | ICD-10-CM | POA: Diagnosis not present

## 2016-10-19 DIAGNOSIS — G20 Parkinson's disease: Secondary | ICD-10-CM | POA: Diagnosis not present

## 2016-10-19 DIAGNOSIS — F329 Major depressive disorder, single episode, unspecified: Secondary | ICD-10-CM | POA: Diagnosis not present

## 2016-10-19 DIAGNOSIS — I10 Essential (primary) hypertension: Secondary | ICD-10-CM | POA: Diagnosis not present

## 2016-10-19 DIAGNOSIS — R634 Abnormal weight loss: Secondary | ICD-10-CM | POA: Diagnosis not present

## 2016-10-19 DIAGNOSIS — R1310 Dysphagia, unspecified: Secondary | ICD-10-CM | POA: Diagnosis not present

## 2016-10-20 DIAGNOSIS — I10 Essential (primary) hypertension: Secondary | ICD-10-CM | POA: Diagnosis not present

## 2016-10-20 DIAGNOSIS — R634 Abnormal weight loss: Secondary | ICD-10-CM | POA: Diagnosis not present

## 2016-10-20 DIAGNOSIS — R1310 Dysphagia, unspecified: Secondary | ICD-10-CM | POA: Diagnosis not present

## 2016-10-20 DIAGNOSIS — G20 Parkinson's disease: Secondary | ICD-10-CM | POA: Diagnosis not present

## 2016-10-20 DIAGNOSIS — F329 Major depressive disorder, single episode, unspecified: Secondary | ICD-10-CM | POA: Diagnosis not present

## 2016-10-21 DIAGNOSIS — I10 Essential (primary) hypertension: Secondary | ICD-10-CM | POA: Diagnosis not present

## 2016-10-21 DIAGNOSIS — R634 Abnormal weight loss: Secondary | ICD-10-CM | POA: Diagnosis not present

## 2016-10-21 DIAGNOSIS — F329 Major depressive disorder, single episode, unspecified: Secondary | ICD-10-CM | POA: Diagnosis not present

## 2016-10-21 DIAGNOSIS — G20 Parkinson's disease: Secondary | ICD-10-CM | POA: Diagnosis not present

## 2016-10-21 DIAGNOSIS — R1310 Dysphagia, unspecified: Secondary | ICD-10-CM | POA: Diagnosis not present

## 2016-10-22 DIAGNOSIS — G20 Parkinson's disease: Secondary | ICD-10-CM | POA: Diagnosis not present

## 2016-10-22 DIAGNOSIS — I10 Essential (primary) hypertension: Secondary | ICD-10-CM | POA: Diagnosis not present

## 2016-10-22 DIAGNOSIS — R1310 Dysphagia, unspecified: Secondary | ICD-10-CM | POA: Diagnosis not present

## 2016-10-22 DIAGNOSIS — F329 Major depressive disorder, single episode, unspecified: Secondary | ICD-10-CM | POA: Diagnosis not present

## 2016-10-22 DIAGNOSIS — R634 Abnormal weight loss: Secondary | ICD-10-CM | POA: Diagnosis not present

## 2016-10-25 DIAGNOSIS — G20 Parkinson's disease: Secondary | ICD-10-CM | POA: Diagnosis not present

## 2016-10-25 DIAGNOSIS — R1310 Dysphagia, unspecified: Secondary | ICD-10-CM | POA: Diagnosis not present

## 2016-10-25 DIAGNOSIS — F329 Major depressive disorder, single episode, unspecified: Secondary | ICD-10-CM | POA: Diagnosis not present

## 2016-10-25 DIAGNOSIS — I10 Essential (primary) hypertension: Secondary | ICD-10-CM | POA: Diagnosis not present

## 2016-10-25 DIAGNOSIS — R634 Abnormal weight loss: Secondary | ICD-10-CM | POA: Diagnosis not present

## 2016-10-26 DIAGNOSIS — R634 Abnormal weight loss: Secondary | ICD-10-CM | POA: Diagnosis not present

## 2016-10-26 DIAGNOSIS — F329 Major depressive disorder, single episode, unspecified: Secondary | ICD-10-CM | POA: Diagnosis not present

## 2016-10-26 DIAGNOSIS — G20 Parkinson's disease: Secondary | ICD-10-CM | POA: Diagnosis not present

## 2016-10-26 DIAGNOSIS — I10 Essential (primary) hypertension: Secondary | ICD-10-CM | POA: Diagnosis not present

## 2016-10-26 DIAGNOSIS — R1310 Dysphagia, unspecified: Secondary | ICD-10-CM | POA: Diagnosis not present

## 2016-10-27 DIAGNOSIS — R634 Abnormal weight loss: Secondary | ICD-10-CM | POA: Diagnosis not present

## 2016-10-27 DIAGNOSIS — R1310 Dysphagia, unspecified: Secondary | ICD-10-CM | POA: Diagnosis not present

## 2016-10-27 DIAGNOSIS — I10 Essential (primary) hypertension: Secondary | ICD-10-CM | POA: Diagnosis not present

## 2016-10-27 DIAGNOSIS — G20 Parkinson's disease: Secondary | ICD-10-CM | POA: Diagnosis not present

## 2016-10-27 DIAGNOSIS — F329 Major depressive disorder, single episode, unspecified: Secondary | ICD-10-CM | POA: Diagnosis not present

## 2016-10-28 DIAGNOSIS — R5383 Other fatigue: Secondary | ICD-10-CM | POA: Diagnosis not present

## 2016-10-28 DIAGNOSIS — G309 Alzheimer's disease, unspecified: Secondary | ICD-10-CM | POA: Diagnosis not present

## 2016-10-28 DIAGNOSIS — R410 Disorientation, unspecified: Secondary | ICD-10-CM | POA: Diagnosis not present

## 2016-10-28 DIAGNOSIS — R1310 Dysphagia, unspecified: Secondary | ICD-10-CM | POA: Diagnosis not present

## 2016-10-28 DIAGNOSIS — R269 Unspecified abnormalities of gait and mobility: Secondary | ICD-10-CM | POA: Diagnosis not present

## 2016-10-28 DIAGNOSIS — I10 Essential (primary) hypertension: Secondary | ICD-10-CM | POA: Diagnosis not present

## 2016-10-28 DIAGNOSIS — M545 Low back pain: Secondary | ICD-10-CM | POA: Diagnosis not present

## 2016-10-28 DIAGNOSIS — G20 Parkinson's disease: Secondary | ICD-10-CM | POA: Diagnosis not present

## 2016-10-28 DIAGNOSIS — K5900 Constipation, unspecified: Secondary | ICD-10-CM | POA: Diagnosis not present

## 2016-10-28 DIAGNOSIS — F329 Major depressive disorder, single episode, unspecified: Secondary | ICD-10-CM | POA: Diagnosis not present

## 2016-10-28 DIAGNOSIS — R634 Abnormal weight loss: Secondary | ICD-10-CM | POA: Diagnosis not present

## 2016-10-29 DIAGNOSIS — F329 Major depressive disorder, single episode, unspecified: Secondary | ICD-10-CM | POA: Diagnosis not present

## 2016-10-29 DIAGNOSIS — R634 Abnormal weight loss: Secondary | ICD-10-CM | POA: Diagnosis not present

## 2016-10-29 DIAGNOSIS — I10 Essential (primary) hypertension: Secondary | ICD-10-CM | POA: Diagnosis not present

## 2016-10-29 DIAGNOSIS — R1310 Dysphagia, unspecified: Secondary | ICD-10-CM | POA: Diagnosis not present

## 2016-10-29 DIAGNOSIS — G20 Parkinson's disease: Secondary | ICD-10-CM | POA: Diagnosis not present

## 2016-11-01 DIAGNOSIS — I10 Essential (primary) hypertension: Secondary | ICD-10-CM | POA: Diagnosis not present

## 2016-11-01 DIAGNOSIS — G20 Parkinson's disease: Secondary | ICD-10-CM | POA: Diagnosis not present

## 2016-11-01 DIAGNOSIS — R1310 Dysphagia, unspecified: Secondary | ICD-10-CM | POA: Diagnosis not present

## 2016-11-01 DIAGNOSIS — F329 Major depressive disorder, single episode, unspecified: Secondary | ICD-10-CM | POA: Diagnosis not present

## 2016-11-01 DIAGNOSIS — R634 Abnormal weight loss: Secondary | ICD-10-CM | POA: Diagnosis not present

## 2016-11-02 DIAGNOSIS — G20 Parkinson's disease: Secondary | ICD-10-CM | POA: Diagnosis not present

## 2016-11-02 DIAGNOSIS — R634 Abnormal weight loss: Secondary | ICD-10-CM | POA: Diagnosis not present

## 2016-11-02 DIAGNOSIS — I10 Essential (primary) hypertension: Secondary | ICD-10-CM | POA: Diagnosis not present

## 2016-11-02 DIAGNOSIS — R1310 Dysphagia, unspecified: Secondary | ICD-10-CM | POA: Diagnosis not present

## 2016-11-02 DIAGNOSIS — F329 Major depressive disorder, single episode, unspecified: Secondary | ICD-10-CM | POA: Diagnosis not present

## 2016-11-03 DIAGNOSIS — G20 Parkinson's disease: Secondary | ICD-10-CM | POA: Diagnosis not present

## 2016-11-03 DIAGNOSIS — R1310 Dysphagia, unspecified: Secondary | ICD-10-CM | POA: Diagnosis not present

## 2016-11-03 DIAGNOSIS — R634 Abnormal weight loss: Secondary | ICD-10-CM | POA: Diagnosis not present

## 2016-11-03 DIAGNOSIS — I10 Essential (primary) hypertension: Secondary | ICD-10-CM | POA: Diagnosis not present

## 2016-11-03 DIAGNOSIS — F329 Major depressive disorder, single episode, unspecified: Secondary | ICD-10-CM | POA: Diagnosis not present

## 2016-11-04 DIAGNOSIS — G20 Parkinson's disease: Secondary | ICD-10-CM | POA: Diagnosis not present

## 2016-11-04 DIAGNOSIS — F329 Major depressive disorder, single episode, unspecified: Secondary | ICD-10-CM | POA: Diagnosis not present

## 2016-11-04 DIAGNOSIS — R1310 Dysphagia, unspecified: Secondary | ICD-10-CM | POA: Diagnosis not present

## 2016-11-04 DIAGNOSIS — I10 Essential (primary) hypertension: Secondary | ICD-10-CM | POA: Diagnosis not present

## 2016-11-04 DIAGNOSIS — R634 Abnormal weight loss: Secondary | ICD-10-CM | POA: Diagnosis not present

## 2016-11-05 DIAGNOSIS — R1310 Dysphagia, unspecified: Secondary | ICD-10-CM | POA: Diagnosis not present

## 2016-11-05 DIAGNOSIS — F329 Major depressive disorder, single episode, unspecified: Secondary | ICD-10-CM | POA: Diagnosis not present

## 2016-11-05 DIAGNOSIS — G20 Parkinson's disease: Secondary | ICD-10-CM | POA: Diagnosis not present

## 2016-11-05 DIAGNOSIS — R634 Abnormal weight loss: Secondary | ICD-10-CM | POA: Diagnosis not present

## 2016-11-05 DIAGNOSIS — I10 Essential (primary) hypertension: Secondary | ICD-10-CM | POA: Diagnosis not present

## 2016-11-07 DIAGNOSIS — R634 Abnormal weight loss: Secondary | ICD-10-CM | POA: Diagnosis not present

## 2016-11-07 DIAGNOSIS — R1310 Dysphagia, unspecified: Secondary | ICD-10-CM | POA: Diagnosis not present

## 2016-11-07 DIAGNOSIS — I10 Essential (primary) hypertension: Secondary | ICD-10-CM | POA: Diagnosis not present

## 2016-11-07 DIAGNOSIS — F329 Major depressive disorder, single episode, unspecified: Secondary | ICD-10-CM | POA: Diagnosis not present

## 2016-11-07 DIAGNOSIS — G20 Parkinson's disease: Secondary | ICD-10-CM | POA: Diagnosis not present

## 2016-11-08 DIAGNOSIS — G20 Parkinson's disease: Secondary | ICD-10-CM | POA: Diagnosis not present

## 2016-11-08 DIAGNOSIS — I10 Essential (primary) hypertension: Secondary | ICD-10-CM | POA: Diagnosis not present

## 2016-11-08 DIAGNOSIS — R1310 Dysphagia, unspecified: Secondary | ICD-10-CM | POA: Diagnosis not present

## 2016-11-08 DIAGNOSIS — F329 Major depressive disorder, single episode, unspecified: Secondary | ICD-10-CM | POA: Diagnosis not present

## 2016-11-08 DIAGNOSIS — R634 Abnormal weight loss: Secondary | ICD-10-CM | POA: Diagnosis not present

## 2016-11-09 DIAGNOSIS — Z79899 Other long term (current) drug therapy: Secondary | ICD-10-CM | POA: Diagnosis not present

## 2016-11-09 DIAGNOSIS — G20 Parkinson's disease: Secondary | ICD-10-CM | POA: Diagnosis not present

## 2016-11-09 DIAGNOSIS — M6281 Muscle weakness (generalized): Secondary | ICD-10-CM | POA: Diagnosis not present

## 2016-11-09 DIAGNOSIS — S51011A Laceration without foreign body of right elbow, initial encounter: Secondary | ICD-10-CM | POA: Diagnosis not present

## 2016-11-09 DIAGNOSIS — R634 Abnormal weight loss: Secondary | ICD-10-CM | POA: Diagnosis not present

## 2016-11-09 DIAGNOSIS — F329 Major depressive disorder, single episode, unspecified: Secondary | ICD-10-CM | POA: Diagnosis not present

## 2016-11-09 DIAGNOSIS — R1310 Dysphagia, unspecified: Secondary | ICD-10-CM | POA: Diagnosis not present

## 2016-11-09 DIAGNOSIS — I10 Essential (primary) hypertension: Secondary | ICD-10-CM | POA: Diagnosis not present

## 2016-11-09 DIAGNOSIS — M791 Myalgia: Secondary | ICD-10-CM | POA: Diagnosis not present

## 2016-11-09 DIAGNOSIS — Z66 Do not resuscitate: Secondary | ICD-10-CM | POA: Diagnosis not present

## 2016-11-10 DIAGNOSIS — R1310 Dysphagia, unspecified: Secondary | ICD-10-CM | POA: Diagnosis not present

## 2016-11-10 DIAGNOSIS — I10 Essential (primary) hypertension: Secondary | ICD-10-CM | POA: Diagnosis not present

## 2016-11-10 DIAGNOSIS — G20 Parkinson's disease: Secondary | ICD-10-CM | POA: Diagnosis not present

## 2016-11-10 DIAGNOSIS — F329 Major depressive disorder, single episode, unspecified: Secondary | ICD-10-CM | POA: Diagnosis not present

## 2016-11-10 DIAGNOSIS — R634 Abnormal weight loss: Secondary | ICD-10-CM | POA: Diagnosis not present

## 2016-11-11 DIAGNOSIS — R634 Abnormal weight loss: Secondary | ICD-10-CM | POA: Diagnosis not present

## 2016-11-11 DIAGNOSIS — F329 Major depressive disorder, single episode, unspecified: Secondary | ICD-10-CM | POA: Diagnosis not present

## 2016-11-11 DIAGNOSIS — I10 Essential (primary) hypertension: Secondary | ICD-10-CM | POA: Diagnosis not present

## 2016-11-11 DIAGNOSIS — R1310 Dysphagia, unspecified: Secondary | ICD-10-CM | POA: Diagnosis not present

## 2016-11-11 DIAGNOSIS — G20 Parkinson's disease: Secondary | ICD-10-CM | POA: Diagnosis not present

## 2016-11-12 DIAGNOSIS — G20 Parkinson's disease: Secondary | ICD-10-CM | POA: Diagnosis not present

## 2016-11-12 DIAGNOSIS — R634 Abnormal weight loss: Secondary | ICD-10-CM | POA: Diagnosis not present

## 2016-11-12 DIAGNOSIS — R262 Difficulty in walking, not elsewhere classified: Secondary | ICD-10-CM | POA: Diagnosis not present

## 2016-11-12 DIAGNOSIS — F329 Major depressive disorder, single episode, unspecified: Secondary | ICD-10-CM | POA: Diagnosis not present

## 2016-11-12 DIAGNOSIS — R1310 Dysphagia, unspecified: Secondary | ICD-10-CM | POA: Diagnosis not present

## 2016-11-12 DIAGNOSIS — E1121 Type 2 diabetes mellitus with diabetic nephropathy: Secondary | ICD-10-CM | POA: Diagnosis not present

## 2016-11-12 DIAGNOSIS — M79673 Pain in unspecified foot: Secondary | ICD-10-CM | POA: Diagnosis not present

## 2016-11-12 DIAGNOSIS — I10 Essential (primary) hypertension: Secondary | ICD-10-CM | POA: Diagnosis not present

## 2016-11-12 DIAGNOSIS — L603 Nail dystrophy: Secondary | ICD-10-CM | POA: Diagnosis not present

## 2016-11-15 DIAGNOSIS — G20 Parkinson's disease: Secondary | ICD-10-CM | POA: Diagnosis not present

## 2016-11-15 DIAGNOSIS — R634 Abnormal weight loss: Secondary | ICD-10-CM | POA: Diagnosis not present

## 2016-11-15 DIAGNOSIS — R1310 Dysphagia, unspecified: Secondary | ICD-10-CM | POA: Diagnosis not present

## 2016-11-15 DIAGNOSIS — F329 Major depressive disorder, single episode, unspecified: Secondary | ICD-10-CM | POA: Diagnosis not present

## 2016-11-15 DIAGNOSIS — I10 Essential (primary) hypertension: Secondary | ICD-10-CM | POA: Diagnosis not present

## 2016-11-16 DIAGNOSIS — G20 Parkinson's disease: Secondary | ICD-10-CM | POA: Diagnosis not present

## 2016-11-16 DIAGNOSIS — I10 Essential (primary) hypertension: Secondary | ICD-10-CM | POA: Diagnosis not present

## 2016-11-16 DIAGNOSIS — R634 Abnormal weight loss: Secondary | ICD-10-CM | POA: Diagnosis not present

## 2016-11-16 DIAGNOSIS — F329 Major depressive disorder, single episode, unspecified: Secondary | ICD-10-CM | POA: Diagnosis not present

## 2016-11-16 DIAGNOSIS — R1310 Dysphagia, unspecified: Secondary | ICD-10-CM | POA: Diagnosis not present

## 2016-11-17 DIAGNOSIS — F329 Major depressive disorder, single episode, unspecified: Secondary | ICD-10-CM | POA: Diagnosis not present

## 2016-11-17 DIAGNOSIS — G20 Parkinson's disease: Secondary | ICD-10-CM | POA: Diagnosis not present

## 2016-11-17 DIAGNOSIS — R1310 Dysphagia, unspecified: Secondary | ICD-10-CM | POA: Diagnosis not present

## 2016-11-17 DIAGNOSIS — R634 Abnormal weight loss: Secondary | ICD-10-CM | POA: Diagnosis not present

## 2016-11-17 DIAGNOSIS — I10 Essential (primary) hypertension: Secondary | ICD-10-CM | POA: Diagnosis not present

## 2016-11-18 DIAGNOSIS — F329 Major depressive disorder, single episode, unspecified: Secondary | ICD-10-CM | POA: Diagnosis not present

## 2016-11-18 DIAGNOSIS — R634 Abnormal weight loss: Secondary | ICD-10-CM | POA: Diagnosis not present

## 2016-11-18 DIAGNOSIS — I10 Essential (primary) hypertension: Secondary | ICD-10-CM | POA: Diagnosis not present

## 2016-11-18 DIAGNOSIS — G20 Parkinson's disease: Secondary | ICD-10-CM | POA: Diagnosis not present

## 2016-11-18 DIAGNOSIS — R1310 Dysphagia, unspecified: Secondary | ICD-10-CM | POA: Diagnosis not present

## 2016-11-19 DIAGNOSIS — R634 Abnormal weight loss: Secondary | ICD-10-CM | POA: Diagnosis not present

## 2016-11-19 DIAGNOSIS — R1310 Dysphagia, unspecified: Secondary | ICD-10-CM | POA: Diagnosis not present

## 2016-11-19 DIAGNOSIS — F329 Major depressive disorder, single episode, unspecified: Secondary | ICD-10-CM | POA: Diagnosis not present

## 2016-11-19 DIAGNOSIS — I10 Essential (primary) hypertension: Secondary | ICD-10-CM | POA: Diagnosis not present

## 2016-11-19 DIAGNOSIS — G20 Parkinson's disease: Secondary | ICD-10-CM | POA: Diagnosis not present

## 2016-11-22 DIAGNOSIS — F329 Major depressive disorder, single episode, unspecified: Secondary | ICD-10-CM | POA: Diagnosis not present

## 2016-11-22 DIAGNOSIS — G20 Parkinson's disease: Secondary | ICD-10-CM | POA: Diagnosis not present

## 2016-11-22 DIAGNOSIS — I10 Essential (primary) hypertension: Secondary | ICD-10-CM | POA: Diagnosis not present

## 2016-11-22 DIAGNOSIS — R1310 Dysphagia, unspecified: Secondary | ICD-10-CM | POA: Diagnosis not present

## 2016-11-22 DIAGNOSIS — R634 Abnormal weight loss: Secondary | ICD-10-CM | POA: Diagnosis not present

## 2016-11-23 DIAGNOSIS — R1310 Dysphagia, unspecified: Secondary | ICD-10-CM | POA: Diagnosis not present

## 2016-11-23 DIAGNOSIS — I10 Essential (primary) hypertension: Secondary | ICD-10-CM | POA: Diagnosis not present

## 2016-11-23 DIAGNOSIS — G20 Parkinson's disease: Secondary | ICD-10-CM | POA: Diagnosis not present

## 2016-11-23 DIAGNOSIS — R634 Abnormal weight loss: Secondary | ICD-10-CM | POA: Diagnosis not present

## 2016-11-23 DIAGNOSIS — F329 Major depressive disorder, single episode, unspecified: Secondary | ICD-10-CM | POA: Diagnosis not present

## 2016-11-24 DIAGNOSIS — G20 Parkinson's disease: Secondary | ICD-10-CM | POA: Diagnosis not present

## 2016-11-24 DIAGNOSIS — I10 Essential (primary) hypertension: Secondary | ICD-10-CM | POA: Diagnosis not present

## 2016-11-24 DIAGNOSIS — R1310 Dysphagia, unspecified: Secondary | ICD-10-CM | POA: Diagnosis not present

## 2016-11-24 DIAGNOSIS — F329 Major depressive disorder, single episode, unspecified: Secondary | ICD-10-CM | POA: Diagnosis not present

## 2016-11-24 DIAGNOSIS — R634 Abnormal weight loss: Secondary | ICD-10-CM | POA: Diagnosis not present

## 2016-11-25 DIAGNOSIS — R634 Abnormal weight loss: Secondary | ICD-10-CM | POA: Diagnosis not present

## 2016-11-25 DIAGNOSIS — I10 Essential (primary) hypertension: Secondary | ICD-10-CM | POA: Diagnosis not present

## 2016-11-25 DIAGNOSIS — R1310 Dysphagia, unspecified: Secondary | ICD-10-CM | POA: Diagnosis not present

## 2016-11-25 DIAGNOSIS — F329 Major depressive disorder, single episode, unspecified: Secondary | ICD-10-CM | POA: Diagnosis not present

## 2016-11-25 DIAGNOSIS — G20 Parkinson's disease: Secondary | ICD-10-CM | POA: Diagnosis not present

## 2016-11-26 DIAGNOSIS — G20 Parkinson's disease: Secondary | ICD-10-CM | POA: Diagnosis not present

## 2016-11-26 DIAGNOSIS — R634 Abnormal weight loss: Secondary | ICD-10-CM | POA: Diagnosis not present

## 2016-11-26 DIAGNOSIS — I10 Essential (primary) hypertension: Secondary | ICD-10-CM | POA: Diagnosis not present

## 2016-11-26 DIAGNOSIS — F329 Major depressive disorder, single episode, unspecified: Secondary | ICD-10-CM | POA: Diagnosis not present

## 2016-11-26 DIAGNOSIS — R1310 Dysphagia, unspecified: Secondary | ICD-10-CM | POA: Diagnosis not present

## 2016-11-29 DIAGNOSIS — G20 Parkinson's disease: Secondary | ICD-10-CM | POA: Diagnosis not present

## 2016-11-29 DIAGNOSIS — R634 Abnormal weight loss: Secondary | ICD-10-CM | POA: Diagnosis not present

## 2016-11-29 DIAGNOSIS — I10 Essential (primary) hypertension: Secondary | ICD-10-CM | POA: Diagnosis not present

## 2016-11-29 DIAGNOSIS — R1310 Dysphagia, unspecified: Secondary | ICD-10-CM | POA: Diagnosis not present

## 2016-11-29 DIAGNOSIS — F329 Major depressive disorder, single episode, unspecified: Secondary | ICD-10-CM | POA: Diagnosis not present

## 2016-11-30 DIAGNOSIS — I10 Essential (primary) hypertension: Secondary | ICD-10-CM | POA: Diagnosis not present

## 2016-11-30 DIAGNOSIS — R1310 Dysphagia, unspecified: Secondary | ICD-10-CM | POA: Diagnosis not present

## 2016-11-30 DIAGNOSIS — R634 Abnormal weight loss: Secondary | ICD-10-CM | POA: Diagnosis not present

## 2016-11-30 DIAGNOSIS — F329 Major depressive disorder, single episode, unspecified: Secondary | ICD-10-CM | POA: Diagnosis not present

## 2016-11-30 DIAGNOSIS — G20 Parkinson's disease: Secondary | ICD-10-CM | POA: Diagnosis not present

## 2016-12-01 DIAGNOSIS — R634 Abnormal weight loss: Secondary | ICD-10-CM | POA: Diagnosis not present

## 2016-12-01 DIAGNOSIS — I10 Essential (primary) hypertension: Secondary | ICD-10-CM | POA: Diagnosis not present

## 2016-12-01 DIAGNOSIS — R1310 Dysphagia, unspecified: Secondary | ICD-10-CM | POA: Diagnosis not present

## 2016-12-01 DIAGNOSIS — G20 Parkinson's disease: Secondary | ICD-10-CM | POA: Diagnosis not present

## 2016-12-01 DIAGNOSIS — F329 Major depressive disorder, single episode, unspecified: Secondary | ICD-10-CM | POA: Diagnosis not present

## 2016-12-02 DIAGNOSIS — R1310 Dysphagia, unspecified: Secondary | ICD-10-CM | POA: Diagnosis not present

## 2016-12-02 DIAGNOSIS — K5900 Constipation, unspecified: Secondary | ICD-10-CM | POA: Diagnosis not present

## 2016-12-02 DIAGNOSIS — I10 Essential (primary) hypertension: Secondary | ICD-10-CM | POA: Diagnosis not present

## 2016-12-02 DIAGNOSIS — K210 Gastro-esophageal reflux disease with esophagitis: Secondary | ICD-10-CM | POA: Diagnosis not present

## 2016-12-02 DIAGNOSIS — M6281 Muscle weakness (generalized): Secondary | ICD-10-CM | POA: Diagnosis not present

## 2016-12-02 DIAGNOSIS — G20 Parkinson's disease: Secondary | ICD-10-CM | POA: Diagnosis not present

## 2016-12-02 DIAGNOSIS — R634 Abnormal weight loss: Secondary | ICD-10-CM | POA: Diagnosis not present

## 2016-12-02 DIAGNOSIS — F329 Major depressive disorder, single episode, unspecified: Secondary | ICD-10-CM | POA: Diagnosis not present

## 2016-12-02 DIAGNOSIS — M791 Myalgia: Secondary | ICD-10-CM | POA: Diagnosis not present

## 2016-12-03 DIAGNOSIS — R634 Abnormal weight loss: Secondary | ICD-10-CM | POA: Diagnosis not present

## 2016-12-03 DIAGNOSIS — F329 Major depressive disorder, single episode, unspecified: Secondary | ICD-10-CM | POA: Diagnosis not present

## 2016-12-03 DIAGNOSIS — R1310 Dysphagia, unspecified: Secondary | ICD-10-CM | POA: Diagnosis not present

## 2016-12-03 DIAGNOSIS — G20 Parkinson's disease: Secondary | ICD-10-CM | POA: Diagnosis not present

## 2016-12-03 DIAGNOSIS — I10 Essential (primary) hypertension: Secondary | ICD-10-CM | POA: Diagnosis not present

## 2016-12-06 DIAGNOSIS — G20 Parkinson's disease: Secondary | ICD-10-CM | POA: Diagnosis not present

## 2016-12-06 DIAGNOSIS — R634 Abnormal weight loss: Secondary | ICD-10-CM | POA: Diagnosis not present

## 2016-12-06 DIAGNOSIS — I10 Essential (primary) hypertension: Secondary | ICD-10-CM | POA: Diagnosis not present

## 2016-12-06 DIAGNOSIS — F329 Major depressive disorder, single episode, unspecified: Secondary | ICD-10-CM | POA: Diagnosis not present

## 2016-12-06 DIAGNOSIS — R1310 Dysphagia, unspecified: Secondary | ICD-10-CM | POA: Diagnosis not present

## 2016-12-07 DIAGNOSIS — Z66 Do not resuscitate: Secondary | ICD-10-CM | POA: Diagnosis not present

## 2016-12-07 DIAGNOSIS — M791 Myalgia: Secondary | ICD-10-CM | POA: Diagnosis not present

## 2016-12-07 DIAGNOSIS — M6281 Muscle weakness (generalized): Secondary | ICD-10-CM | POA: Diagnosis not present

## 2016-12-07 DIAGNOSIS — I10 Essential (primary) hypertension: Secondary | ICD-10-CM | POA: Diagnosis not present

## 2016-12-07 DIAGNOSIS — F329 Major depressive disorder, single episode, unspecified: Secondary | ICD-10-CM | POA: Diagnosis not present

## 2016-12-07 DIAGNOSIS — R1310 Dysphagia, unspecified: Secondary | ICD-10-CM | POA: Diagnosis not present

## 2016-12-07 DIAGNOSIS — Z79899 Other long term (current) drug therapy: Secondary | ICD-10-CM | POA: Diagnosis not present

## 2016-12-07 DIAGNOSIS — R634 Abnormal weight loss: Secondary | ICD-10-CM | POA: Diagnosis not present

## 2016-12-07 DIAGNOSIS — E119 Type 2 diabetes mellitus without complications: Secondary | ICD-10-CM | POA: Diagnosis not present

## 2016-12-07 DIAGNOSIS — G20 Parkinson's disease: Secondary | ICD-10-CM | POA: Diagnosis not present

## 2016-12-08 DIAGNOSIS — G20 Parkinson's disease: Secondary | ICD-10-CM | POA: Diagnosis not present

## 2016-12-08 DIAGNOSIS — F329 Major depressive disorder, single episode, unspecified: Secondary | ICD-10-CM | POA: Diagnosis not present

## 2016-12-08 DIAGNOSIS — R634 Abnormal weight loss: Secondary | ICD-10-CM | POA: Diagnosis not present

## 2016-12-08 DIAGNOSIS — I10 Essential (primary) hypertension: Secondary | ICD-10-CM | POA: Diagnosis not present

## 2016-12-08 DIAGNOSIS — R1310 Dysphagia, unspecified: Secondary | ICD-10-CM | POA: Diagnosis not present

## 2016-12-09 DIAGNOSIS — F329 Major depressive disorder, single episode, unspecified: Secondary | ICD-10-CM | POA: Diagnosis not present

## 2016-12-09 DIAGNOSIS — G20 Parkinson's disease: Secondary | ICD-10-CM | POA: Diagnosis not present

## 2016-12-09 DIAGNOSIS — I10 Essential (primary) hypertension: Secondary | ICD-10-CM | POA: Diagnosis not present

## 2016-12-09 DIAGNOSIS — R634 Abnormal weight loss: Secondary | ICD-10-CM | POA: Diagnosis not present

## 2016-12-09 DIAGNOSIS — R1310 Dysphagia, unspecified: Secondary | ICD-10-CM | POA: Diagnosis not present

## 2016-12-10 DIAGNOSIS — G20 Parkinson's disease: Secondary | ICD-10-CM | POA: Diagnosis not present

## 2016-12-10 DIAGNOSIS — F329 Major depressive disorder, single episode, unspecified: Secondary | ICD-10-CM | POA: Diagnosis not present

## 2016-12-10 DIAGNOSIS — R634 Abnormal weight loss: Secondary | ICD-10-CM | POA: Diagnosis not present

## 2016-12-10 DIAGNOSIS — R1310 Dysphagia, unspecified: Secondary | ICD-10-CM | POA: Diagnosis not present

## 2016-12-10 DIAGNOSIS — I10 Essential (primary) hypertension: Secondary | ICD-10-CM | POA: Diagnosis not present

## 2016-12-11 DIAGNOSIS — F329 Major depressive disorder, single episode, unspecified: Secondary | ICD-10-CM | POA: Diagnosis not present

## 2016-12-11 DIAGNOSIS — I10 Essential (primary) hypertension: Secondary | ICD-10-CM | POA: Diagnosis not present

## 2016-12-11 DIAGNOSIS — R634 Abnormal weight loss: Secondary | ICD-10-CM | POA: Diagnosis not present

## 2016-12-11 DIAGNOSIS — G20 Parkinson's disease: Secondary | ICD-10-CM | POA: Diagnosis not present

## 2016-12-11 DIAGNOSIS — F331 Major depressive disorder, recurrent, moderate: Secondary | ICD-10-CM | POA: Diagnosis not present

## 2016-12-11 DIAGNOSIS — R1310 Dysphagia, unspecified: Secondary | ICD-10-CM | POA: Diagnosis not present

## 2016-12-13 DIAGNOSIS — G20 Parkinson's disease: Secondary | ICD-10-CM | POA: Diagnosis not present

## 2016-12-13 DIAGNOSIS — R634 Abnormal weight loss: Secondary | ICD-10-CM | POA: Diagnosis not present

## 2016-12-13 DIAGNOSIS — F329 Major depressive disorder, single episode, unspecified: Secondary | ICD-10-CM | POA: Diagnosis not present

## 2016-12-13 DIAGNOSIS — R1310 Dysphagia, unspecified: Secondary | ICD-10-CM | POA: Diagnosis not present

## 2016-12-13 DIAGNOSIS — I10 Essential (primary) hypertension: Secondary | ICD-10-CM | POA: Diagnosis not present

## 2016-12-14 DIAGNOSIS — I10 Essential (primary) hypertension: Secondary | ICD-10-CM | POA: Diagnosis not present

## 2016-12-14 DIAGNOSIS — F329 Major depressive disorder, single episode, unspecified: Secondary | ICD-10-CM | POA: Diagnosis not present

## 2016-12-14 DIAGNOSIS — R634 Abnormal weight loss: Secondary | ICD-10-CM | POA: Diagnosis not present

## 2016-12-14 DIAGNOSIS — G20 Parkinson's disease: Secondary | ICD-10-CM | POA: Diagnosis not present

## 2016-12-14 DIAGNOSIS — R1310 Dysphagia, unspecified: Secondary | ICD-10-CM | POA: Diagnosis not present

## 2016-12-15 DIAGNOSIS — F329 Major depressive disorder, single episode, unspecified: Secondary | ICD-10-CM | POA: Diagnosis not present

## 2016-12-15 DIAGNOSIS — R1310 Dysphagia, unspecified: Secondary | ICD-10-CM | POA: Diagnosis not present

## 2016-12-15 DIAGNOSIS — G20 Parkinson's disease: Secondary | ICD-10-CM | POA: Diagnosis not present

## 2016-12-15 DIAGNOSIS — R634 Abnormal weight loss: Secondary | ICD-10-CM | POA: Diagnosis not present

## 2016-12-15 DIAGNOSIS — I10 Essential (primary) hypertension: Secondary | ICD-10-CM | POA: Diagnosis not present

## 2016-12-16 DIAGNOSIS — G20 Parkinson's disease: Secondary | ICD-10-CM | POA: Diagnosis not present

## 2016-12-16 DIAGNOSIS — R634 Abnormal weight loss: Secondary | ICD-10-CM | POA: Diagnosis not present

## 2016-12-16 DIAGNOSIS — I10 Essential (primary) hypertension: Secondary | ICD-10-CM | POA: Diagnosis not present

## 2016-12-16 DIAGNOSIS — F329 Major depressive disorder, single episode, unspecified: Secondary | ICD-10-CM | POA: Diagnosis not present

## 2016-12-16 DIAGNOSIS — R1310 Dysphagia, unspecified: Secondary | ICD-10-CM | POA: Diagnosis not present

## 2016-12-17 DIAGNOSIS — R634 Abnormal weight loss: Secondary | ICD-10-CM | POA: Diagnosis not present

## 2016-12-17 DIAGNOSIS — R1310 Dysphagia, unspecified: Secondary | ICD-10-CM | POA: Diagnosis not present

## 2016-12-17 DIAGNOSIS — G20 Parkinson's disease: Secondary | ICD-10-CM | POA: Diagnosis not present

## 2016-12-17 DIAGNOSIS — F329 Major depressive disorder, single episode, unspecified: Secondary | ICD-10-CM | POA: Diagnosis not present

## 2016-12-17 DIAGNOSIS — I10 Essential (primary) hypertension: Secondary | ICD-10-CM | POA: Diagnosis not present

## 2016-12-20 DIAGNOSIS — G20 Parkinson's disease: Secondary | ICD-10-CM | POA: Diagnosis not present

## 2016-12-20 DIAGNOSIS — I10 Essential (primary) hypertension: Secondary | ICD-10-CM | POA: Diagnosis not present

## 2016-12-20 DIAGNOSIS — R1310 Dysphagia, unspecified: Secondary | ICD-10-CM | POA: Diagnosis not present

## 2016-12-20 DIAGNOSIS — R634 Abnormal weight loss: Secondary | ICD-10-CM | POA: Diagnosis not present

## 2016-12-20 DIAGNOSIS — F329 Major depressive disorder, single episode, unspecified: Secondary | ICD-10-CM | POA: Diagnosis not present

## 2016-12-21 DIAGNOSIS — G20 Parkinson's disease: Secondary | ICD-10-CM | POA: Diagnosis not present

## 2016-12-21 DIAGNOSIS — F329 Major depressive disorder, single episode, unspecified: Secondary | ICD-10-CM | POA: Diagnosis not present

## 2016-12-21 DIAGNOSIS — R1310 Dysphagia, unspecified: Secondary | ICD-10-CM | POA: Diagnosis not present

## 2016-12-21 DIAGNOSIS — R634 Abnormal weight loss: Secondary | ICD-10-CM | POA: Diagnosis not present

## 2016-12-21 DIAGNOSIS — I10 Essential (primary) hypertension: Secondary | ICD-10-CM | POA: Diagnosis not present

## 2016-12-22 DIAGNOSIS — R1310 Dysphagia, unspecified: Secondary | ICD-10-CM | POA: Diagnosis not present

## 2016-12-22 DIAGNOSIS — R634 Abnormal weight loss: Secondary | ICD-10-CM | POA: Diagnosis not present

## 2016-12-22 DIAGNOSIS — I10 Essential (primary) hypertension: Secondary | ICD-10-CM | POA: Diagnosis not present

## 2016-12-22 DIAGNOSIS — F329 Major depressive disorder, single episode, unspecified: Secondary | ICD-10-CM | POA: Diagnosis not present

## 2016-12-22 DIAGNOSIS — G20 Parkinson's disease: Secondary | ICD-10-CM | POA: Diagnosis not present

## 2016-12-23 DIAGNOSIS — R1310 Dysphagia, unspecified: Secondary | ICD-10-CM | POA: Diagnosis not present

## 2016-12-23 DIAGNOSIS — R634 Abnormal weight loss: Secondary | ICD-10-CM | POA: Diagnosis not present

## 2016-12-23 DIAGNOSIS — F329 Major depressive disorder, single episode, unspecified: Secondary | ICD-10-CM | POA: Diagnosis not present

## 2016-12-23 DIAGNOSIS — G20 Parkinson's disease: Secondary | ICD-10-CM | POA: Diagnosis not present

## 2016-12-23 DIAGNOSIS — I10 Essential (primary) hypertension: Secondary | ICD-10-CM | POA: Diagnosis not present

## 2016-12-24 DIAGNOSIS — F329 Major depressive disorder, single episode, unspecified: Secondary | ICD-10-CM | POA: Diagnosis not present

## 2016-12-24 DIAGNOSIS — G20 Parkinson's disease: Secondary | ICD-10-CM | POA: Diagnosis not present

## 2016-12-24 DIAGNOSIS — R1310 Dysphagia, unspecified: Secondary | ICD-10-CM | POA: Diagnosis not present

## 2016-12-24 DIAGNOSIS — I10 Essential (primary) hypertension: Secondary | ICD-10-CM | POA: Diagnosis not present

## 2016-12-24 DIAGNOSIS — R634 Abnormal weight loss: Secondary | ICD-10-CM | POA: Diagnosis not present

## 2016-12-27 DIAGNOSIS — F329 Major depressive disorder, single episode, unspecified: Secondary | ICD-10-CM | POA: Diagnosis not present

## 2016-12-27 DIAGNOSIS — R1310 Dysphagia, unspecified: Secondary | ICD-10-CM | POA: Diagnosis not present

## 2016-12-27 DIAGNOSIS — I10 Essential (primary) hypertension: Secondary | ICD-10-CM | POA: Diagnosis not present

## 2016-12-27 DIAGNOSIS — G20 Parkinson's disease: Secondary | ICD-10-CM | POA: Diagnosis not present

## 2016-12-27 DIAGNOSIS — R634 Abnormal weight loss: Secondary | ICD-10-CM | POA: Diagnosis not present

## 2016-12-28 DIAGNOSIS — Z66 Do not resuscitate: Secondary | ICD-10-CM | POA: Diagnosis not present

## 2016-12-28 DIAGNOSIS — E119 Type 2 diabetes mellitus without complications: Secondary | ICD-10-CM | POA: Diagnosis not present

## 2016-12-28 DIAGNOSIS — R634 Abnormal weight loss: Secondary | ICD-10-CM | POA: Diagnosis not present

## 2016-12-28 DIAGNOSIS — M6281 Muscle weakness (generalized): Secondary | ICD-10-CM | POA: Diagnosis not present

## 2016-12-28 DIAGNOSIS — R1310 Dysphagia, unspecified: Secondary | ICD-10-CM | POA: Diagnosis not present

## 2016-12-28 DIAGNOSIS — F329 Major depressive disorder, single episode, unspecified: Secondary | ICD-10-CM | POA: Diagnosis not present

## 2016-12-28 DIAGNOSIS — M791 Myalgia: Secondary | ICD-10-CM | POA: Diagnosis not present

## 2016-12-28 DIAGNOSIS — Z79899 Other long term (current) drug therapy: Secondary | ICD-10-CM | POA: Diagnosis not present

## 2016-12-28 DIAGNOSIS — G20 Parkinson's disease: Secondary | ICD-10-CM | POA: Diagnosis not present

## 2016-12-28 DIAGNOSIS — I10 Essential (primary) hypertension: Secondary | ICD-10-CM | POA: Diagnosis not present

## 2016-12-29 DIAGNOSIS — R1310 Dysphagia, unspecified: Secondary | ICD-10-CM | POA: Diagnosis not present

## 2016-12-29 DIAGNOSIS — F329 Major depressive disorder, single episode, unspecified: Secondary | ICD-10-CM | POA: Diagnosis not present

## 2016-12-29 DIAGNOSIS — G20 Parkinson's disease: Secondary | ICD-10-CM | POA: Diagnosis not present

## 2016-12-29 DIAGNOSIS — I10 Essential (primary) hypertension: Secondary | ICD-10-CM | POA: Diagnosis not present

## 2016-12-29 DIAGNOSIS — R634 Abnormal weight loss: Secondary | ICD-10-CM | POA: Diagnosis not present

## 2016-12-30 DIAGNOSIS — G20 Parkinson's disease: Secondary | ICD-10-CM | POA: Diagnosis not present

## 2016-12-30 DIAGNOSIS — F329 Major depressive disorder, single episode, unspecified: Secondary | ICD-10-CM | POA: Diagnosis not present

## 2016-12-30 DIAGNOSIS — R1310 Dysphagia, unspecified: Secondary | ICD-10-CM | POA: Diagnosis not present

## 2016-12-30 DIAGNOSIS — R634 Abnormal weight loss: Secondary | ICD-10-CM | POA: Diagnosis not present

## 2016-12-30 DIAGNOSIS — I10 Essential (primary) hypertension: Secondary | ICD-10-CM | POA: Diagnosis not present

## 2016-12-31 DIAGNOSIS — I10 Essential (primary) hypertension: Secondary | ICD-10-CM | POA: Diagnosis not present

## 2016-12-31 DIAGNOSIS — F329 Major depressive disorder, single episode, unspecified: Secondary | ICD-10-CM | POA: Diagnosis not present

## 2016-12-31 DIAGNOSIS — R1310 Dysphagia, unspecified: Secondary | ICD-10-CM | POA: Diagnosis not present

## 2016-12-31 DIAGNOSIS — R634 Abnormal weight loss: Secondary | ICD-10-CM | POA: Diagnosis not present

## 2016-12-31 DIAGNOSIS — G20 Parkinson's disease: Secondary | ICD-10-CM | POA: Diagnosis not present

## 2017-01-03 DIAGNOSIS — F329 Major depressive disorder, single episode, unspecified: Secondary | ICD-10-CM | POA: Diagnosis not present

## 2017-01-03 DIAGNOSIS — I10 Essential (primary) hypertension: Secondary | ICD-10-CM | POA: Diagnosis not present

## 2017-01-03 DIAGNOSIS — R634 Abnormal weight loss: Secondary | ICD-10-CM | POA: Diagnosis not present

## 2017-01-03 DIAGNOSIS — G20 Parkinson's disease: Secondary | ICD-10-CM | POA: Diagnosis not present

## 2017-01-03 DIAGNOSIS — R1310 Dysphagia, unspecified: Secondary | ICD-10-CM | POA: Diagnosis not present

## 2017-01-04 DIAGNOSIS — R634 Abnormal weight loss: Secondary | ICD-10-CM | POA: Diagnosis not present

## 2017-01-04 DIAGNOSIS — G20 Parkinson's disease: Secondary | ICD-10-CM | POA: Diagnosis not present

## 2017-01-04 DIAGNOSIS — R1310 Dysphagia, unspecified: Secondary | ICD-10-CM | POA: Diagnosis not present

## 2017-01-04 DIAGNOSIS — F329 Major depressive disorder, single episode, unspecified: Secondary | ICD-10-CM | POA: Diagnosis not present

## 2017-01-04 DIAGNOSIS — I10 Essential (primary) hypertension: Secondary | ICD-10-CM | POA: Diagnosis not present

## 2017-01-05 DIAGNOSIS — R1310 Dysphagia, unspecified: Secondary | ICD-10-CM | POA: Diagnosis not present

## 2017-01-05 DIAGNOSIS — F329 Major depressive disorder, single episode, unspecified: Secondary | ICD-10-CM | POA: Diagnosis not present

## 2017-01-05 DIAGNOSIS — G20 Parkinson's disease: Secondary | ICD-10-CM | POA: Diagnosis not present

## 2017-01-05 DIAGNOSIS — R634 Abnormal weight loss: Secondary | ICD-10-CM | POA: Diagnosis not present

## 2017-01-05 DIAGNOSIS — I10 Essential (primary) hypertension: Secondary | ICD-10-CM | POA: Diagnosis not present

## 2017-01-06 DIAGNOSIS — R1310 Dysphagia, unspecified: Secondary | ICD-10-CM | POA: Diagnosis not present

## 2017-01-06 DIAGNOSIS — F329 Major depressive disorder, single episode, unspecified: Secondary | ICD-10-CM | POA: Diagnosis not present

## 2017-01-06 DIAGNOSIS — I10 Essential (primary) hypertension: Secondary | ICD-10-CM | POA: Diagnosis not present

## 2017-01-06 DIAGNOSIS — G20 Parkinson's disease: Secondary | ICD-10-CM | POA: Diagnosis not present

## 2017-01-06 DIAGNOSIS — R634 Abnormal weight loss: Secondary | ICD-10-CM | POA: Diagnosis not present

## 2017-01-07 DIAGNOSIS — R634 Abnormal weight loss: Secondary | ICD-10-CM | POA: Diagnosis not present

## 2017-01-07 DIAGNOSIS — F329 Major depressive disorder, single episode, unspecified: Secondary | ICD-10-CM | POA: Diagnosis not present

## 2017-01-07 DIAGNOSIS — G20 Parkinson's disease: Secondary | ICD-10-CM | POA: Diagnosis not present

## 2017-01-07 DIAGNOSIS — R1310 Dysphagia, unspecified: Secondary | ICD-10-CM | POA: Diagnosis not present

## 2017-01-07 DIAGNOSIS — I10 Essential (primary) hypertension: Secondary | ICD-10-CM | POA: Diagnosis not present

## 2017-01-10 DIAGNOSIS — R634 Abnormal weight loss: Secondary | ICD-10-CM | POA: Diagnosis not present

## 2017-01-10 DIAGNOSIS — G20 Parkinson's disease: Secondary | ICD-10-CM | POA: Diagnosis not present

## 2017-01-10 DIAGNOSIS — F329 Major depressive disorder, single episode, unspecified: Secondary | ICD-10-CM | POA: Diagnosis not present

## 2017-01-10 DIAGNOSIS — R1310 Dysphagia, unspecified: Secondary | ICD-10-CM | POA: Diagnosis not present

## 2017-01-10 DIAGNOSIS — I10 Essential (primary) hypertension: Secondary | ICD-10-CM | POA: Diagnosis not present

## 2017-01-11 DIAGNOSIS — R634 Abnormal weight loss: Secondary | ICD-10-CM | POA: Diagnosis not present

## 2017-01-11 DIAGNOSIS — R1310 Dysphagia, unspecified: Secondary | ICD-10-CM | POA: Diagnosis not present

## 2017-01-11 DIAGNOSIS — F329 Major depressive disorder, single episode, unspecified: Secondary | ICD-10-CM | POA: Diagnosis not present

## 2017-01-11 DIAGNOSIS — G20 Parkinson's disease: Secondary | ICD-10-CM | POA: Diagnosis not present

## 2017-01-11 DIAGNOSIS — I10 Essential (primary) hypertension: Secondary | ICD-10-CM | POA: Diagnosis not present

## 2017-01-12 DIAGNOSIS — G20 Parkinson's disease: Secondary | ICD-10-CM | POA: Diagnosis not present

## 2017-01-12 DIAGNOSIS — F329 Major depressive disorder, single episode, unspecified: Secondary | ICD-10-CM | POA: Diagnosis not present

## 2017-01-12 DIAGNOSIS — R1310 Dysphagia, unspecified: Secondary | ICD-10-CM | POA: Diagnosis not present

## 2017-01-12 DIAGNOSIS — R634 Abnormal weight loss: Secondary | ICD-10-CM | POA: Diagnosis not present

## 2017-01-12 DIAGNOSIS — I10 Essential (primary) hypertension: Secondary | ICD-10-CM | POA: Diagnosis not present

## 2017-01-13 DIAGNOSIS — R634 Abnormal weight loss: Secondary | ICD-10-CM | POA: Diagnosis not present

## 2017-01-13 DIAGNOSIS — G20 Parkinson's disease: Secondary | ICD-10-CM | POA: Diagnosis not present

## 2017-01-13 DIAGNOSIS — I10 Essential (primary) hypertension: Secondary | ICD-10-CM | POA: Diagnosis not present

## 2017-01-13 DIAGNOSIS — F329 Major depressive disorder, single episode, unspecified: Secondary | ICD-10-CM | POA: Diagnosis not present

## 2017-01-13 DIAGNOSIS — R1310 Dysphagia, unspecified: Secondary | ICD-10-CM | POA: Diagnosis not present

## 2017-01-14 DIAGNOSIS — G20 Parkinson's disease: Secondary | ICD-10-CM | POA: Diagnosis not present

## 2017-01-14 DIAGNOSIS — I10 Essential (primary) hypertension: Secondary | ICD-10-CM | POA: Diagnosis not present

## 2017-01-14 DIAGNOSIS — R634 Abnormal weight loss: Secondary | ICD-10-CM | POA: Diagnosis not present

## 2017-01-14 DIAGNOSIS — R1310 Dysphagia, unspecified: Secondary | ICD-10-CM | POA: Diagnosis not present

## 2017-01-14 DIAGNOSIS — F329 Major depressive disorder, single episode, unspecified: Secondary | ICD-10-CM | POA: Diagnosis not present

## 2017-01-17 DIAGNOSIS — F329 Major depressive disorder, single episode, unspecified: Secondary | ICD-10-CM | POA: Diagnosis not present

## 2017-01-17 DIAGNOSIS — G20 Parkinson's disease: Secondary | ICD-10-CM | POA: Diagnosis not present

## 2017-01-17 DIAGNOSIS — I10 Essential (primary) hypertension: Secondary | ICD-10-CM | POA: Diagnosis not present

## 2017-01-17 DIAGNOSIS — R634 Abnormal weight loss: Secondary | ICD-10-CM | POA: Diagnosis not present

## 2017-01-17 DIAGNOSIS — R1310 Dysphagia, unspecified: Secondary | ICD-10-CM | POA: Diagnosis not present

## 2017-01-18 DIAGNOSIS — I10 Essential (primary) hypertension: Secondary | ICD-10-CM | POA: Diagnosis not present

## 2017-01-18 DIAGNOSIS — F329 Major depressive disorder, single episode, unspecified: Secondary | ICD-10-CM | POA: Diagnosis not present

## 2017-01-18 DIAGNOSIS — R1310 Dysphagia, unspecified: Secondary | ICD-10-CM | POA: Diagnosis not present

## 2017-01-18 DIAGNOSIS — R634 Abnormal weight loss: Secondary | ICD-10-CM | POA: Diagnosis not present

## 2017-01-18 DIAGNOSIS — G20 Parkinson's disease: Secondary | ICD-10-CM | POA: Diagnosis not present

## 2017-01-19 DIAGNOSIS — F329 Major depressive disorder, single episode, unspecified: Secondary | ICD-10-CM | POA: Diagnosis not present

## 2017-01-19 DIAGNOSIS — R1310 Dysphagia, unspecified: Secondary | ICD-10-CM | POA: Diagnosis not present

## 2017-01-19 DIAGNOSIS — R634 Abnormal weight loss: Secondary | ICD-10-CM | POA: Diagnosis not present

## 2017-01-19 DIAGNOSIS — I10 Essential (primary) hypertension: Secondary | ICD-10-CM | POA: Diagnosis not present

## 2017-01-19 DIAGNOSIS — G20 Parkinson's disease: Secondary | ICD-10-CM | POA: Diagnosis not present

## 2017-01-20 DIAGNOSIS — I10 Essential (primary) hypertension: Secondary | ICD-10-CM | POA: Diagnosis not present

## 2017-01-20 DIAGNOSIS — R634 Abnormal weight loss: Secondary | ICD-10-CM | POA: Diagnosis not present

## 2017-01-20 DIAGNOSIS — F329 Major depressive disorder, single episode, unspecified: Secondary | ICD-10-CM | POA: Diagnosis not present

## 2017-01-20 DIAGNOSIS — R1310 Dysphagia, unspecified: Secondary | ICD-10-CM | POA: Diagnosis not present

## 2017-01-20 DIAGNOSIS — G20 Parkinson's disease: Secondary | ICD-10-CM | POA: Diagnosis not present

## 2017-01-21 DIAGNOSIS — I10 Essential (primary) hypertension: Secondary | ICD-10-CM | POA: Diagnosis not present

## 2017-01-21 DIAGNOSIS — G20 Parkinson's disease: Secondary | ICD-10-CM | POA: Diagnosis not present

## 2017-01-21 DIAGNOSIS — R634 Abnormal weight loss: Secondary | ICD-10-CM | POA: Diagnosis not present

## 2017-01-21 DIAGNOSIS — R1310 Dysphagia, unspecified: Secondary | ICD-10-CM | POA: Diagnosis not present

## 2017-01-21 DIAGNOSIS — F329 Major depressive disorder, single episode, unspecified: Secondary | ICD-10-CM | POA: Diagnosis not present

## 2017-01-24 DIAGNOSIS — R1310 Dysphagia, unspecified: Secondary | ICD-10-CM | POA: Diagnosis not present

## 2017-01-24 DIAGNOSIS — R634 Abnormal weight loss: Secondary | ICD-10-CM | POA: Diagnosis not present

## 2017-01-24 DIAGNOSIS — G20 Parkinson's disease: Secondary | ICD-10-CM | POA: Diagnosis not present

## 2017-01-24 DIAGNOSIS — F329 Major depressive disorder, single episode, unspecified: Secondary | ICD-10-CM | POA: Diagnosis not present

## 2017-01-24 DIAGNOSIS — I10 Essential (primary) hypertension: Secondary | ICD-10-CM | POA: Diagnosis not present

## 2017-01-25 DIAGNOSIS — R1310 Dysphagia, unspecified: Secondary | ICD-10-CM | POA: Diagnosis not present

## 2017-01-25 DIAGNOSIS — G20 Parkinson's disease: Secondary | ICD-10-CM | POA: Diagnosis not present

## 2017-01-25 DIAGNOSIS — I10 Essential (primary) hypertension: Secondary | ICD-10-CM | POA: Diagnosis not present

## 2017-01-25 DIAGNOSIS — F329 Major depressive disorder, single episode, unspecified: Secondary | ICD-10-CM | POA: Diagnosis not present

## 2017-01-25 DIAGNOSIS — R634 Abnormal weight loss: Secondary | ICD-10-CM | POA: Diagnosis not present

## 2017-01-26 DIAGNOSIS — F329 Major depressive disorder, single episode, unspecified: Secondary | ICD-10-CM | POA: Diagnosis not present

## 2017-01-26 DIAGNOSIS — G20 Parkinson's disease: Secondary | ICD-10-CM | POA: Diagnosis not present

## 2017-01-26 DIAGNOSIS — R1310 Dysphagia, unspecified: Secondary | ICD-10-CM | POA: Diagnosis not present

## 2017-01-26 DIAGNOSIS — R634 Abnormal weight loss: Secondary | ICD-10-CM | POA: Diagnosis not present

## 2017-01-26 DIAGNOSIS — I10 Essential (primary) hypertension: Secondary | ICD-10-CM | POA: Diagnosis not present

## 2017-01-27 DIAGNOSIS — R1310 Dysphagia, unspecified: Secondary | ICD-10-CM | POA: Diagnosis not present

## 2017-01-27 DIAGNOSIS — G20 Parkinson's disease: Secondary | ICD-10-CM | POA: Diagnosis not present

## 2017-01-27 DIAGNOSIS — I10 Essential (primary) hypertension: Secondary | ICD-10-CM | POA: Diagnosis not present

## 2017-01-27 DIAGNOSIS — F329 Major depressive disorder, single episode, unspecified: Secondary | ICD-10-CM | POA: Diagnosis not present

## 2017-01-27 DIAGNOSIS — R634 Abnormal weight loss: Secondary | ICD-10-CM | POA: Diagnosis not present

## 2017-01-28 DIAGNOSIS — R634 Abnormal weight loss: Secondary | ICD-10-CM | POA: Diagnosis not present

## 2017-01-28 DIAGNOSIS — R1310 Dysphagia, unspecified: Secondary | ICD-10-CM | POA: Diagnosis not present

## 2017-01-28 DIAGNOSIS — G20 Parkinson's disease: Secondary | ICD-10-CM | POA: Diagnosis not present

## 2017-01-28 DIAGNOSIS — F329 Major depressive disorder, single episode, unspecified: Secondary | ICD-10-CM | POA: Diagnosis not present

## 2017-01-28 DIAGNOSIS — I10 Essential (primary) hypertension: Secondary | ICD-10-CM | POA: Diagnosis not present

## 2017-01-31 DIAGNOSIS — E1121 Type 2 diabetes mellitus with diabetic nephropathy: Secondary | ICD-10-CM | POA: Diagnosis not present

## 2017-01-31 DIAGNOSIS — I10 Essential (primary) hypertension: Secondary | ICD-10-CM | POA: Diagnosis not present

## 2017-01-31 DIAGNOSIS — R2681 Unsteadiness on feet: Secondary | ICD-10-CM | POA: Diagnosis not present

## 2017-01-31 DIAGNOSIS — F329 Major depressive disorder, single episode, unspecified: Secondary | ICD-10-CM | POA: Diagnosis not present

## 2017-01-31 DIAGNOSIS — M79673 Pain in unspecified foot: Secondary | ICD-10-CM | POA: Diagnosis not present

## 2017-01-31 DIAGNOSIS — L851 Acquired keratosis [keratoderma] palmaris et plantaris: Secondary | ICD-10-CM | POA: Diagnosis not present

## 2017-01-31 DIAGNOSIS — R1310 Dysphagia, unspecified: Secondary | ICD-10-CM | POA: Diagnosis not present

## 2017-01-31 DIAGNOSIS — L603 Nail dystrophy: Secondary | ICD-10-CM | POA: Diagnosis not present

## 2017-01-31 DIAGNOSIS — G20 Parkinson's disease: Secondary | ICD-10-CM | POA: Diagnosis not present

## 2017-01-31 DIAGNOSIS — L602 Onychogryphosis: Secondary | ICD-10-CM | POA: Diagnosis not present

## 2017-01-31 DIAGNOSIS — R634 Abnormal weight loss: Secondary | ICD-10-CM | POA: Diagnosis not present

## 2017-02-01 DIAGNOSIS — I10 Essential (primary) hypertension: Secondary | ICD-10-CM | POA: Diagnosis not present

## 2017-02-01 DIAGNOSIS — F329 Major depressive disorder, single episode, unspecified: Secondary | ICD-10-CM | POA: Diagnosis not present

## 2017-02-01 DIAGNOSIS — R634 Abnormal weight loss: Secondary | ICD-10-CM | POA: Diagnosis not present

## 2017-02-01 DIAGNOSIS — R1310 Dysphagia, unspecified: Secondary | ICD-10-CM | POA: Diagnosis not present

## 2017-02-01 DIAGNOSIS — G20 Parkinson's disease: Secondary | ICD-10-CM | POA: Diagnosis not present

## 2017-02-02 DIAGNOSIS — I10 Essential (primary) hypertension: Secondary | ICD-10-CM | POA: Diagnosis not present

## 2017-02-02 DIAGNOSIS — F329 Major depressive disorder, single episode, unspecified: Secondary | ICD-10-CM | POA: Diagnosis not present

## 2017-02-02 DIAGNOSIS — R634 Abnormal weight loss: Secondary | ICD-10-CM | POA: Diagnosis not present

## 2017-02-02 DIAGNOSIS — G20 Parkinson's disease: Secondary | ICD-10-CM | POA: Diagnosis not present

## 2017-02-02 DIAGNOSIS — R1310 Dysphagia, unspecified: Secondary | ICD-10-CM | POA: Diagnosis not present

## 2017-02-03 DIAGNOSIS — I10 Essential (primary) hypertension: Secondary | ICD-10-CM | POA: Diagnosis not present

## 2017-02-03 DIAGNOSIS — G20 Parkinson's disease: Secondary | ICD-10-CM | POA: Diagnosis not present

## 2017-02-03 DIAGNOSIS — R634 Abnormal weight loss: Secondary | ICD-10-CM | POA: Diagnosis not present

## 2017-02-03 DIAGNOSIS — F329 Major depressive disorder, single episode, unspecified: Secondary | ICD-10-CM | POA: Diagnosis not present

## 2017-02-03 DIAGNOSIS — R1310 Dysphagia, unspecified: Secondary | ICD-10-CM | POA: Diagnosis not present

## 2017-02-04 DIAGNOSIS — I10 Essential (primary) hypertension: Secondary | ICD-10-CM | POA: Diagnosis not present

## 2017-02-04 DIAGNOSIS — R634 Abnormal weight loss: Secondary | ICD-10-CM | POA: Diagnosis not present

## 2017-02-04 DIAGNOSIS — G20 Parkinson's disease: Secondary | ICD-10-CM | POA: Diagnosis not present

## 2017-02-04 DIAGNOSIS — F329 Major depressive disorder, single episode, unspecified: Secondary | ICD-10-CM | POA: Diagnosis not present

## 2017-02-04 DIAGNOSIS — R1310 Dysphagia, unspecified: Secondary | ICD-10-CM | POA: Diagnosis not present

## 2017-02-07 DIAGNOSIS — G20 Parkinson's disease: Secondary | ICD-10-CM | POA: Diagnosis not present

## 2017-02-07 DIAGNOSIS — R634 Abnormal weight loss: Secondary | ICD-10-CM | POA: Diagnosis not present

## 2017-02-07 DIAGNOSIS — F329 Major depressive disorder, single episode, unspecified: Secondary | ICD-10-CM | POA: Diagnosis not present

## 2017-02-07 DIAGNOSIS — R1310 Dysphagia, unspecified: Secondary | ICD-10-CM | POA: Diagnosis not present

## 2017-02-07 DIAGNOSIS — I10 Essential (primary) hypertension: Secondary | ICD-10-CM | POA: Diagnosis not present

## 2017-02-08 DIAGNOSIS — I10 Essential (primary) hypertension: Secondary | ICD-10-CM | POA: Diagnosis not present

## 2017-02-08 DIAGNOSIS — R634 Abnormal weight loss: Secondary | ICD-10-CM | POA: Diagnosis not present

## 2017-02-08 DIAGNOSIS — R1310 Dysphagia, unspecified: Secondary | ICD-10-CM | POA: Diagnosis not present

## 2017-02-08 DIAGNOSIS — F329 Major depressive disorder, single episode, unspecified: Secondary | ICD-10-CM | POA: Diagnosis not present

## 2017-02-08 DIAGNOSIS — G20 Parkinson's disease: Secondary | ICD-10-CM | POA: Diagnosis not present

## 2017-02-09 DIAGNOSIS — R634 Abnormal weight loss: Secondary | ICD-10-CM | POA: Diagnosis not present

## 2017-02-09 DIAGNOSIS — G20 Parkinson's disease: Secondary | ICD-10-CM | POA: Diagnosis not present

## 2017-02-09 DIAGNOSIS — R1310 Dysphagia, unspecified: Secondary | ICD-10-CM | POA: Diagnosis not present

## 2017-02-09 DIAGNOSIS — F329 Major depressive disorder, single episode, unspecified: Secondary | ICD-10-CM | POA: Diagnosis not present

## 2017-02-09 DIAGNOSIS — I10 Essential (primary) hypertension: Secondary | ICD-10-CM | POA: Diagnosis not present

## 2017-02-10 DIAGNOSIS — I10 Essential (primary) hypertension: Secondary | ICD-10-CM | POA: Diagnosis not present

## 2017-02-10 DIAGNOSIS — R634 Abnormal weight loss: Secondary | ICD-10-CM | POA: Diagnosis not present

## 2017-02-10 DIAGNOSIS — Z6821 Body mass index (BMI) 21.0-21.9, adult: Secondary | ICD-10-CM | POA: Diagnosis not present

## 2017-02-10 DIAGNOSIS — R531 Weakness: Secondary | ICD-10-CM | POA: Diagnosis not present

## 2017-02-10 DIAGNOSIS — F329 Major depressive disorder, single episode, unspecified: Secondary | ICD-10-CM | POA: Diagnosis not present

## 2017-02-10 DIAGNOSIS — G20 Parkinson's disease: Secondary | ICD-10-CM | POA: Diagnosis not present

## 2017-02-10 DIAGNOSIS — R1310 Dysphagia, unspecified: Secondary | ICD-10-CM | POA: Diagnosis not present

## 2017-02-11 DIAGNOSIS — R1310 Dysphagia, unspecified: Secondary | ICD-10-CM | POA: Diagnosis not present

## 2017-02-11 DIAGNOSIS — Z6821 Body mass index (BMI) 21.0-21.9, adult: Secondary | ICD-10-CM | POA: Diagnosis not present

## 2017-02-11 DIAGNOSIS — G20 Parkinson's disease: Secondary | ICD-10-CM | POA: Diagnosis not present

## 2017-02-11 DIAGNOSIS — R634 Abnormal weight loss: Secondary | ICD-10-CM | POA: Diagnosis not present

## 2017-02-11 DIAGNOSIS — F329 Major depressive disorder, single episode, unspecified: Secondary | ICD-10-CM | POA: Diagnosis not present

## 2017-02-11 DIAGNOSIS — R531 Weakness: Secondary | ICD-10-CM | POA: Diagnosis not present

## 2017-02-14 DIAGNOSIS — G20 Parkinson's disease: Secondary | ICD-10-CM | POA: Diagnosis not present

## 2017-02-14 DIAGNOSIS — R531 Weakness: Secondary | ICD-10-CM | POA: Diagnosis not present

## 2017-02-14 DIAGNOSIS — R1310 Dysphagia, unspecified: Secondary | ICD-10-CM | POA: Diagnosis not present

## 2017-02-14 DIAGNOSIS — F329 Major depressive disorder, single episode, unspecified: Secondary | ICD-10-CM | POA: Diagnosis not present

## 2017-02-14 DIAGNOSIS — R634 Abnormal weight loss: Secondary | ICD-10-CM | POA: Diagnosis not present

## 2017-02-14 DIAGNOSIS — Z6821 Body mass index (BMI) 21.0-21.9, adult: Secondary | ICD-10-CM | POA: Diagnosis not present

## 2017-02-15 DIAGNOSIS — G20 Parkinson's disease: Secondary | ICD-10-CM | POA: Diagnosis not present

## 2017-02-15 DIAGNOSIS — Z6821 Body mass index (BMI) 21.0-21.9, adult: Secondary | ICD-10-CM | POA: Diagnosis not present

## 2017-02-15 DIAGNOSIS — R634 Abnormal weight loss: Secondary | ICD-10-CM | POA: Diagnosis not present

## 2017-02-15 DIAGNOSIS — R531 Weakness: Secondary | ICD-10-CM | POA: Diagnosis not present

## 2017-02-15 DIAGNOSIS — R1310 Dysphagia, unspecified: Secondary | ICD-10-CM | POA: Diagnosis not present

## 2017-02-15 DIAGNOSIS — F329 Major depressive disorder, single episode, unspecified: Secondary | ICD-10-CM | POA: Diagnosis not present

## 2017-02-16 DIAGNOSIS — R634 Abnormal weight loss: Secondary | ICD-10-CM | POA: Diagnosis not present

## 2017-02-16 DIAGNOSIS — Z6821 Body mass index (BMI) 21.0-21.9, adult: Secondary | ICD-10-CM | POA: Diagnosis not present

## 2017-02-16 DIAGNOSIS — G20 Parkinson's disease: Secondary | ICD-10-CM | POA: Diagnosis not present

## 2017-02-16 DIAGNOSIS — R1310 Dysphagia, unspecified: Secondary | ICD-10-CM | POA: Diagnosis not present

## 2017-02-16 DIAGNOSIS — F324 Major depressive disorder, single episode, in partial remission: Secondary | ICD-10-CM | POA: Diagnosis not present

## 2017-02-16 DIAGNOSIS — F4312 Post-traumatic stress disorder, chronic: Secondary | ICD-10-CM | POA: Diagnosis not present

## 2017-02-16 DIAGNOSIS — F329 Major depressive disorder, single episode, unspecified: Secondary | ICD-10-CM | POA: Diagnosis not present

## 2017-02-16 DIAGNOSIS — R531 Weakness: Secondary | ICD-10-CM | POA: Diagnosis not present

## 2017-02-17 DIAGNOSIS — R634 Abnormal weight loss: Secondary | ICD-10-CM | POA: Diagnosis not present

## 2017-02-17 DIAGNOSIS — F329 Major depressive disorder, single episode, unspecified: Secondary | ICD-10-CM | POA: Diagnosis not present

## 2017-02-17 DIAGNOSIS — R1310 Dysphagia, unspecified: Secondary | ICD-10-CM | POA: Diagnosis not present

## 2017-02-17 DIAGNOSIS — Z79899 Other long term (current) drug therapy: Secondary | ICD-10-CM | POA: Diagnosis not present

## 2017-02-17 DIAGNOSIS — Z6821 Body mass index (BMI) 21.0-21.9, adult: Secondary | ICD-10-CM | POA: Diagnosis not present

## 2017-02-17 DIAGNOSIS — G20 Parkinson's disease: Secondary | ICD-10-CM | POA: Diagnosis not present

## 2017-02-17 DIAGNOSIS — R531 Weakness: Secondary | ICD-10-CM | POA: Diagnosis not present

## 2017-02-18 DIAGNOSIS — R634 Abnormal weight loss: Secondary | ICD-10-CM | POA: Diagnosis not present

## 2017-02-18 DIAGNOSIS — F331 Major depressive disorder, recurrent, moderate: Secondary | ICD-10-CM | POA: Diagnosis not present

## 2017-02-18 DIAGNOSIS — G20 Parkinson's disease: Secondary | ICD-10-CM | POA: Diagnosis not present

## 2017-02-18 DIAGNOSIS — F329 Major depressive disorder, single episode, unspecified: Secondary | ICD-10-CM | POA: Diagnosis not present

## 2017-02-18 DIAGNOSIS — F4312 Post-traumatic stress disorder, chronic: Secondary | ICD-10-CM | POA: Diagnosis not present

## 2017-02-18 DIAGNOSIS — Z6821 Body mass index (BMI) 21.0-21.9, adult: Secondary | ICD-10-CM | POA: Diagnosis not present

## 2017-02-18 DIAGNOSIS — R1310 Dysphagia, unspecified: Secondary | ICD-10-CM | POA: Diagnosis not present

## 2017-02-18 DIAGNOSIS — R531 Weakness: Secondary | ICD-10-CM | POA: Diagnosis not present

## 2017-02-18 DIAGNOSIS — G309 Alzheimer's disease, unspecified: Secondary | ICD-10-CM | POA: Diagnosis not present

## 2017-02-21 DIAGNOSIS — R531 Weakness: Secondary | ICD-10-CM | POA: Diagnosis not present

## 2017-02-21 DIAGNOSIS — R1310 Dysphagia, unspecified: Secondary | ICD-10-CM | POA: Diagnosis not present

## 2017-02-21 DIAGNOSIS — G20 Parkinson's disease: Secondary | ICD-10-CM | POA: Diagnosis not present

## 2017-02-21 DIAGNOSIS — Z79899 Other long term (current) drug therapy: Secondary | ICD-10-CM | POA: Diagnosis not present

## 2017-02-21 DIAGNOSIS — R634 Abnormal weight loss: Secondary | ICD-10-CM | POA: Diagnosis not present

## 2017-02-21 DIAGNOSIS — F329 Major depressive disorder, single episode, unspecified: Secondary | ICD-10-CM | POA: Diagnosis not present

## 2017-02-21 DIAGNOSIS — Z6821 Body mass index (BMI) 21.0-21.9, adult: Secondary | ICD-10-CM | POA: Diagnosis not present

## 2017-02-22 DIAGNOSIS — G20 Parkinson's disease: Secondary | ICD-10-CM | POA: Diagnosis not present

## 2017-02-22 DIAGNOSIS — R531 Weakness: Secondary | ICD-10-CM | POA: Diagnosis not present

## 2017-02-22 DIAGNOSIS — R1310 Dysphagia, unspecified: Secondary | ICD-10-CM | POA: Diagnosis not present

## 2017-02-22 DIAGNOSIS — F329 Major depressive disorder, single episode, unspecified: Secondary | ICD-10-CM | POA: Diagnosis not present

## 2017-02-22 DIAGNOSIS — Z6821 Body mass index (BMI) 21.0-21.9, adult: Secondary | ICD-10-CM | POA: Diagnosis not present

## 2017-02-22 DIAGNOSIS — R634 Abnormal weight loss: Secondary | ICD-10-CM | POA: Diagnosis not present

## 2017-02-23 DIAGNOSIS — R531 Weakness: Secondary | ICD-10-CM | POA: Diagnosis not present

## 2017-02-23 DIAGNOSIS — R1310 Dysphagia, unspecified: Secondary | ICD-10-CM | POA: Diagnosis not present

## 2017-02-23 DIAGNOSIS — R634 Abnormal weight loss: Secondary | ICD-10-CM | POA: Diagnosis not present

## 2017-02-23 DIAGNOSIS — F329 Major depressive disorder, single episode, unspecified: Secondary | ICD-10-CM | POA: Diagnosis not present

## 2017-02-23 DIAGNOSIS — Z6821 Body mass index (BMI) 21.0-21.9, adult: Secondary | ICD-10-CM | POA: Diagnosis not present

## 2017-02-23 DIAGNOSIS — G20 Parkinson's disease: Secondary | ICD-10-CM | POA: Diagnosis not present

## 2017-02-24 DIAGNOSIS — R531 Weakness: Secondary | ICD-10-CM | POA: Diagnosis not present

## 2017-02-24 DIAGNOSIS — R634 Abnormal weight loss: Secondary | ICD-10-CM | POA: Diagnosis not present

## 2017-02-24 DIAGNOSIS — Z6821 Body mass index (BMI) 21.0-21.9, adult: Secondary | ICD-10-CM | POA: Diagnosis not present

## 2017-02-24 DIAGNOSIS — G20 Parkinson's disease: Secondary | ICD-10-CM | POA: Diagnosis not present

## 2017-02-24 DIAGNOSIS — F329 Major depressive disorder, single episode, unspecified: Secondary | ICD-10-CM | POA: Diagnosis not present

## 2017-02-24 DIAGNOSIS — R1310 Dysphagia, unspecified: Secondary | ICD-10-CM | POA: Diagnosis not present

## 2017-02-25 DIAGNOSIS — R531 Weakness: Secondary | ICD-10-CM | POA: Diagnosis not present

## 2017-02-25 DIAGNOSIS — G20 Parkinson's disease: Secondary | ICD-10-CM | POA: Diagnosis not present

## 2017-02-25 DIAGNOSIS — F329 Major depressive disorder, single episode, unspecified: Secondary | ICD-10-CM | POA: Diagnosis not present

## 2017-02-25 DIAGNOSIS — Z6821 Body mass index (BMI) 21.0-21.9, adult: Secondary | ICD-10-CM | POA: Diagnosis not present

## 2017-02-25 DIAGNOSIS — R634 Abnormal weight loss: Secondary | ICD-10-CM | POA: Diagnosis not present

## 2017-02-25 DIAGNOSIS — R1310 Dysphagia, unspecified: Secondary | ICD-10-CM | POA: Diagnosis not present

## 2017-02-28 DIAGNOSIS — Z6821 Body mass index (BMI) 21.0-21.9, adult: Secondary | ICD-10-CM | POA: Diagnosis not present

## 2017-02-28 DIAGNOSIS — R531 Weakness: Secondary | ICD-10-CM | POA: Diagnosis not present

## 2017-02-28 DIAGNOSIS — G20 Parkinson's disease: Secondary | ICD-10-CM | POA: Diagnosis not present

## 2017-02-28 DIAGNOSIS — R1310 Dysphagia, unspecified: Secondary | ICD-10-CM | POA: Diagnosis not present

## 2017-02-28 DIAGNOSIS — F329 Major depressive disorder, single episode, unspecified: Secondary | ICD-10-CM | POA: Diagnosis not present

## 2017-02-28 DIAGNOSIS — R634 Abnormal weight loss: Secondary | ICD-10-CM | POA: Diagnosis not present

## 2017-03-01 DIAGNOSIS — R634 Abnormal weight loss: Secondary | ICD-10-CM | POA: Diagnosis not present

## 2017-03-01 DIAGNOSIS — F329 Major depressive disorder, single episode, unspecified: Secondary | ICD-10-CM | POA: Diagnosis not present

## 2017-03-01 DIAGNOSIS — R1310 Dysphagia, unspecified: Secondary | ICD-10-CM | POA: Diagnosis not present

## 2017-03-01 DIAGNOSIS — G20 Parkinson's disease: Secondary | ICD-10-CM | POA: Diagnosis not present

## 2017-03-01 DIAGNOSIS — Z6821 Body mass index (BMI) 21.0-21.9, adult: Secondary | ICD-10-CM | POA: Diagnosis not present

## 2017-03-01 DIAGNOSIS — R531 Weakness: Secondary | ICD-10-CM | POA: Diagnosis not present

## 2017-03-02 DIAGNOSIS — R634 Abnormal weight loss: Secondary | ICD-10-CM | POA: Diagnosis not present

## 2017-03-02 DIAGNOSIS — R531 Weakness: Secondary | ICD-10-CM | POA: Diagnosis not present

## 2017-03-02 DIAGNOSIS — R1310 Dysphagia, unspecified: Secondary | ICD-10-CM | POA: Diagnosis not present

## 2017-03-02 DIAGNOSIS — F329 Major depressive disorder, single episode, unspecified: Secondary | ICD-10-CM | POA: Diagnosis not present

## 2017-03-02 DIAGNOSIS — G20 Parkinson's disease: Secondary | ICD-10-CM | POA: Diagnosis not present

## 2017-03-02 DIAGNOSIS — Z6821 Body mass index (BMI) 21.0-21.9, adult: Secondary | ICD-10-CM | POA: Diagnosis not present

## 2017-03-03 DIAGNOSIS — F329 Major depressive disorder, single episode, unspecified: Secondary | ICD-10-CM | POA: Diagnosis not present

## 2017-03-03 DIAGNOSIS — R634 Abnormal weight loss: Secondary | ICD-10-CM | POA: Diagnosis not present

## 2017-03-03 DIAGNOSIS — R1310 Dysphagia, unspecified: Secondary | ICD-10-CM | POA: Diagnosis not present

## 2017-03-03 DIAGNOSIS — G20 Parkinson's disease: Secondary | ICD-10-CM | POA: Diagnosis not present

## 2017-03-03 DIAGNOSIS — R531 Weakness: Secondary | ICD-10-CM | POA: Diagnosis not present

## 2017-03-03 DIAGNOSIS — Z6821 Body mass index (BMI) 21.0-21.9, adult: Secondary | ICD-10-CM | POA: Diagnosis not present

## 2017-03-04 DIAGNOSIS — R634 Abnormal weight loss: Secondary | ICD-10-CM | POA: Diagnosis not present

## 2017-03-04 DIAGNOSIS — R1310 Dysphagia, unspecified: Secondary | ICD-10-CM | POA: Diagnosis not present

## 2017-03-04 DIAGNOSIS — F329 Major depressive disorder, single episode, unspecified: Secondary | ICD-10-CM | POA: Diagnosis not present

## 2017-03-04 DIAGNOSIS — Z6821 Body mass index (BMI) 21.0-21.9, adult: Secondary | ICD-10-CM | POA: Diagnosis not present

## 2017-03-04 DIAGNOSIS — G20 Parkinson's disease: Secondary | ICD-10-CM | POA: Diagnosis not present

## 2017-03-04 DIAGNOSIS — R531 Weakness: Secondary | ICD-10-CM | POA: Diagnosis not present

## 2017-03-07 DIAGNOSIS — F329 Major depressive disorder, single episode, unspecified: Secondary | ICD-10-CM | POA: Diagnosis not present

## 2017-03-07 DIAGNOSIS — R634 Abnormal weight loss: Secondary | ICD-10-CM | POA: Diagnosis not present

## 2017-03-07 DIAGNOSIS — G20 Parkinson's disease: Secondary | ICD-10-CM | POA: Diagnosis not present

## 2017-03-07 DIAGNOSIS — R531 Weakness: Secondary | ICD-10-CM | POA: Diagnosis not present

## 2017-03-07 DIAGNOSIS — Z6821 Body mass index (BMI) 21.0-21.9, adult: Secondary | ICD-10-CM | POA: Diagnosis not present

## 2017-03-07 DIAGNOSIS — R1310 Dysphagia, unspecified: Secondary | ICD-10-CM | POA: Diagnosis not present

## 2017-03-08 DIAGNOSIS — R634 Abnormal weight loss: Secondary | ICD-10-CM | POA: Diagnosis not present

## 2017-03-08 DIAGNOSIS — F329 Major depressive disorder, single episode, unspecified: Secondary | ICD-10-CM | POA: Diagnosis not present

## 2017-03-08 DIAGNOSIS — R1310 Dysphagia, unspecified: Secondary | ICD-10-CM | POA: Diagnosis not present

## 2017-03-08 DIAGNOSIS — G20 Parkinson's disease: Secondary | ICD-10-CM | POA: Diagnosis not present

## 2017-03-08 DIAGNOSIS — R531 Weakness: Secondary | ICD-10-CM | POA: Diagnosis not present

## 2017-03-08 DIAGNOSIS — Z6821 Body mass index (BMI) 21.0-21.9, adult: Secondary | ICD-10-CM | POA: Diagnosis not present

## 2017-03-09 DIAGNOSIS — G20 Parkinson's disease: Secondary | ICD-10-CM | POA: Diagnosis not present

## 2017-03-09 DIAGNOSIS — R1310 Dysphagia, unspecified: Secondary | ICD-10-CM | POA: Diagnosis not present

## 2017-03-09 DIAGNOSIS — R634 Abnormal weight loss: Secondary | ICD-10-CM | POA: Diagnosis not present

## 2017-03-09 DIAGNOSIS — Z6821 Body mass index (BMI) 21.0-21.9, adult: Secondary | ICD-10-CM | POA: Diagnosis not present

## 2017-03-09 DIAGNOSIS — F329 Major depressive disorder, single episode, unspecified: Secondary | ICD-10-CM | POA: Diagnosis not present

## 2017-03-09 DIAGNOSIS — R531 Weakness: Secondary | ICD-10-CM | POA: Diagnosis not present

## 2017-03-10 DIAGNOSIS — F329 Major depressive disorder, single episode, unspecified: Secondary | ICD-10-CM | POA: Diagnosis not present

## 2017-03-10 DIAGNOSIS — R531 Weakness: Secondary | ICD-10-CM | POA: Diagnosis not present

## 2017-03-10 DIAGNOSIS — R1310 Dysphagia, unspecified: Secondary | ICD-10-CM | POA: Diagnosis not present

## 2017-03-10 DIAGNOSIS — G20 Parkinson's disease: Secondary | ICD-10-CM | POA: Diagnosis not present

## 2017-03-10 DIAGNOSIS — Z6821 Body mass index (BMI) 21.0-21.9, adult: Secondary | ICD-10-CM | POA: Diagnosis not present

## 2017-03-10 DIAGNOSIS — R634 Abnormal weight loss: Secondary | ICD-10-CM | POA: Diagnosis not present

## 2017-03-11 DIAGNOSIS — G20 Parkinson's disease: Secondary | ICD-10-CM | POA: Diagnosis not present

## 2017-03-11 DIAGNOSIS — R1310 Dysphagia, unspecified: Secondary | ICD-10-CM | POA: Diagnosis not present

## 2017-03-11 DIAGNOSIS — R634 Abnormal weight loss: Secondary | ICD-10-CM | POA: Diagnosis not present

## 2017-03-11 DIAGNOSIS — R531 Weakness: Secondary | ICD-10-CM | POA: Diagnosis not present

## 2017-03-11 DIAGNOSIS — Z6821 Body mass index (BMI) 21.0-21.9, adult: Secondary | ICD-10-CM | POA: Diagnosis not present

## 2017-03-11 DIAGNOSIS — F329 Major depressive disorder, single episode, unspecified: Secondary | ICD-10-CM | POA: Diagnosis not present

## 2017-03-12 DIAGNOSIS — Z6821 Body mass index (BMI) 21.0-21.9, adult: Secondary | ICD-10-CM | POA: Diagnosis not present

## 2017-03-12 DIAGNOSIS — R531 Weakness: Secondary | ICD-10-CM | POA: Diagnosis not present

## 2017-03-12 DIAGNOSIS — R1310 Dysphagia, unspecified: Secondary | ICD-10-CM | POA: Diagnosis not present

## 2017-03-12 DIAGNOSIS — F329 Major depressive disorder, single episode, unspecified: Secondary | ICD-10-CM | POA: Diagnosis not present

## 2017-03-12 DIAGNOSIS — I10 Essential (primary) hypertension: Secondary | ICD-10-CM | POA: Diagnosis not present

## 2017-03-12 DIAGNOSIS — G20 Parkinson's disease: Secondary | ICD-10-CM | POA: Diagnosis not present

## 2017-03-12 DIAGNOSIS — R634 Abnormal weight loss: Secondary | ICD-10-CM | POA: Diagnosis not present

## 2017-03-14 DIAGNOSIS — Z6821 Body mass index (BMI) 21.0-21.9, adult: Secondary | ICD-10-CM | POA: Diagnosis not present

## 2017-03-14 DIAGNOSIS — F329 Major depressive disorder, single episode, unspecified: Secondary | ICD-10-CM | POA: Diagnosis not present

## 2017-03-14 DIAGNOSIS — R634 Abnormal weight loss: Secondary | ICD-10-CM | POA: Diagnosis not present

## 2017-03-14 DIAGNOSIS — R1310 Dysphagia, unspecified: Secondary | ICD-10-CM | POA: Diagnosis not present

## 2017-03-14 DIAGNOSIS — G20 Parkinson's disease: Secondary | ICD-10-CM | POA: Diagnosis not present

## 2017-03-14 DIAGNOSIS — R531 Weakness: Secondary | ICD-10-CM | POA: Diagnosis not present

## 2017-03-15 DIAGNOSIS — R1310 Dysphagia, unspecified: Secondary | ICD-10-CM | POA: Diagnosis not present

## 2017-03-15 DIAGNOSIS — Z6821 Body mass index (BMI) 21.0-21.9, adult: Secondary | ICD-10-CM | POA: Diagnosis not present

## 2017-03-15 DIAGNOSIS — G20 Parkinson's disease: Secondary | ICD-10-CM | POA: Diagnosis not present

## 2017-03-15 DIAGNOSIS — R634 Abnormal weight loss: Secondary | ICD-10-CM | POA: Diagnosis not present

## 2017-03-15 DIAGNOSIS — R531 Weakness: Secondary | ICD-10-CM | POA: Diagnosis not present

## 2017-03-15 DIAGNOSIS — F329 Major depressive disorder, single episode, unspecified: Secondary | ICD-10-CM | POA: Diagnosis not present

## 2017-03-16 DIAGNOSIS — R1310 Dysphagia, unspecified: Secondary | ICD-10-CM | POA: Diagnosis not present

## 2017-03-16 DIAGNOSIS — F329 Major depressive disorder, single episode, unspecified: Secondary | ICD-10-CM | POA: Diagnosis not present

## 2017-03-16 DIAGNOSIS — Z6821 Body mass index (BMI) 21.0-21.9, adult: Secondary | ICD-10-CM | POA: Diagnosis not present

## 2017-03-16 DIAGNOSIS — R634 Abnormal weight loss: Secondary | ICD-10-CM | POA: Diagnosis not present

## 2017-03-16 DIAGNOSIS — G20 Parkinson's disease: Secondary | ICD-10-CM | POA: Diagnosis not present

## 2017-03-16 DIAGNOSIS — R531 Weakness: Secondary | ICD-10-CM | POA: Diagnosis not present

## 2017-03-17 DIAGNOSIS — F329 Major depressive disorder, single episode, unspecified: Secondary | ICD-10-CM | POA: Diagnosis not present

## 2017-03-17 DIAGNOSIS — R531 Weakness: Secondary | ICD-10-CM | POA: Diagnosis not present

## 2017-03-17 DIAGNOSIS — G20 Parkinson's disease: Secondary | ICD-10-CM | POA: Diagnosis not present

## 2017-03-17 DIAGNOSIS — Z6821 Body mass index (BMI) 21.0-21.9, adult: Secondary | ICD-10-CM | POA: Diagnosis not present

## 2017-03-17 DIAGNOSIS — R1310 Dysphagia, unspecified: Secondary | ICD-10-CM | POA: Diagnosis not present

## 2017-03-17 DIAGNOSIS — R634 Abnormal weight loss: Secondary | ICD-10-CM | POA: Diagnosis not present

## 2017-03-18 DIAGNOSIS — R634 Abnormal weight loss: Secondary | ICD-10-CM | POA: Diagnosis not present

## 2017-03-18 DIAGNOSIS — R1310 Dysphagia, unspecified: Secondary | ICD-10-CM | POA: Diagnosis not present

## 2017-03-18 DIAGNOSIS — G20 Parkinson's disease: Secondary | ICD-10-CM | POA: Diagnosis not present

## 2017-03-18 DIAGNOSIS — R531 Weakness: Secondary | ICD-10-CM | POA: Diagnosis not present

## 2017-03-18 DIAGNOSIS — F329 Major depressive disorder, single episode, unspecified: Secondary | ICD-10-CM | POA: Diagnosis not present

## 2017-03-18 DIAGNOSIS — Z6821 Body mass index (BMI) 21.0-21.9, adult: Secondary | ICD-10-CM | POA: Diagnosis not present

## 2017-03-22 DIAGNOSIS — G20 Parkinson's disease: Secondary | ICD-10-CM | POA: Diagnosis not present

## 2017-03-22 DIAGNOSIS — Z6821 Body mass index (BMI) 21.0-21.9, adult: Secondary | ICD-10-CM | POA: Diagnosis not present

## 2017-03-22 DIAGNOSIS — R531 Weakness: Secondary | ICD-10-CM | POA: Diagnosis not present

## 2017-03-22 DIAGNOSIS — R1310 Dysphagia, unspecified: Secondary | ICD-10-CM | POA: Diagnosis not present

## 2017-03-22 DIAGNOSIS — F329 Major depressive disorder, single episode, unspecified: Secondary | ICD-10-CM | POA: Diagnosis not present

## 2017-03-22 DIAGNOSIS — R634 Abnormal weight loss: Secondary | ICD-10-CM | POA: Diagnosis not present

## 2017-03-23 DIAGNOSIS — Z6821 Body mass index (BMI) 21.0-21.9, adult: Secondary | ICD-10-CM | POA: Diagnosis not present

## 2017-03-23 DIAGNOSIS — R634 Abnormal weight loss: Secondary | ICD-10-CM | POA: Diagnosis not present

## 2017-03-23 DIAGNOSIS — R1310 Dysphagia, unspecified: Secondary | ICD-10-CM | POA: Diagnosis not present

## 2017-03-23 DIAGNOSIS — G20 Parkinson's disease: Secondary | ICD-10-CM | POA: Diagnosis not present

## 2017-03-23 DIAGNOSIS — F329 Major depressive disorder, single episode, unspecified: Secondary | ICD-10-CM | POA: Diagnosis not present

## 2017-03-23 DIAGNOSIS — R531 Weakness: Secondary | ICD-10-CM | POA: Diagnosis not present

## 2017-03-24 DIAGNOSIS — R1310 Dysphagia, unspecified: Secondary | ICD-10-CM | POA: Diagnosis not present

## 2017-03-24 DIAGNOSIS — F329 Major depressive disorder, single episode, unspecified: Secondary | ICD-10-CM | POA: Diagnosis not present

## 2017-03-24 DIAGNOSIS — R531 Weakness: Secondary | ICD-10-CM | POA: Diagnosis not present

## 2017-03-24 DIAGNOSIS — R634 Abnormal weight loss: Secondary | ICD-10-CM | POA: Diagnosis not present

## 2017-03-24 DIAGNOSIS — G20 Parkinson's disease: Secondary | ICD-10-CM | POA: Diagnosis not present

## 2017-03-24 DIAGNOSIS — Z6821 Body mass index (BMI) 21.0-21.9, adult: Secondary | ICD-10-CM | POA: Diagnosis not present

## 2017-03-25 DIAGNOSIS — G20 Parkinson's disease: Secondary | ICD-10-CM | POA: Diagnosis not present

## 2017-03-25 DIAGNOSIS — Z6821 Body mass index (BMI) 21.0-21.9, adult: Secondary | ICD-10-CM | POA: Diagnosis not present

## 2017-03-25 DIAGNOSIS — F329 Major depressive disorder, single episode, unspecified: Secondary | ICD-10-CM | POA: Diagnosis not present

## 2017-03-25 DIAGNOSIS — R1310 Dysphagia, unspecified: Secondary | ICD-10-CM | POA: Diagnosis not present

## 2017-03-25 DIAGNOSIS — R531 Weakness: Secondary | ICD-10-CM | POA: Diagnosis not present

## 2017-03-25 DIAGNOSIS — R634 Abnormal weight loss: Secondary | ICD-10-CM | POA: Diagnosis not present

## 2017-03-28 DIAGNOSIS — R634 Abnormal weight loss: Secondary | ICD-10-CM | POA: Diagnosis not present

## 2017-03-28 DIAGNOSIS — G20 Parkinson's disease: Secondary | ICD-10-CM | POA: Diagnosis not present

## 2017-03-28 DIAGNOSIS — F329 Major depressive disorder, single episode, unspecified: Secondary | ICD-10-CM | POA: Diagnosis not present

## 2017-03-28 DIAGNOSIS — R1310 Dysphagia, unspecified: Secondary | ICD-10-CM | POA: Diagnosis not present

## 2017-03-28 DIAGNOSIS — Z6821 Body mass index (BMI) 21.0-21.9, adult: Secondary | ICD-10-CM | POA: Diagnosis not present

## 2017-03-28 DIAGNOSIS — R531 Weakness: Secondary | ICD-10-CM | POA: Diagnosis not present

## 2017-03-29 DIAGNOSIS — R531 Weakness: Secondary | ICD-10-CM | POA: Diagnosis not present

## 2017-03-29 DIAGNOSIS — F329 Major depressive disorder, single episode, unspecified: Secondary | ICD-10-CM | POA: Diagnosis not present

## 2017-03-29 DIAGNOSIS — G20 Parkinson's disease: Secondary | ICD-10-CM | POA: Diagnosis not present

## 2017-03-29 DIAGNOSIS — R634 Abnormal weight loss: Secondary | ICD-10-CM | POA: Diagnosis not present

## 2017-03-29 DIAGNOSIS — R1310 Dysphagia, unspecified: Secondary | ICD-10-CM | POA: Diagnosis not present

## 2017-03-29 DIAGNOSIS — Z6821 Body mass index (BMI) 21.0-21.9, adult: Secondary | ICD-10-CM | POA: Diagnosis not present

## 2017-03-30 DIAGNOSIS — R1310 Dysphagia, unspecified: Secondary | ICD-10-CM | POA: Diagnosis not present

## 2017-03-30 DIAGNOSIS — R531 Weakness: Secondary | ICD-10-CM | POA: Diagnosis not present

## 2017-03-30 DIAGNOSIS — R634 Abnormal weight loss: Secondary | ICD-10-CM | POA: Diagnosis not present

## 2017-03-30 DIAGNOSIS — G20 Parkinson's disease: Secondary | ICD-10-CM | POA: Diagnosis not present

## 2017-03-30 DIAGNOSIS — F329 Major depressive disorder, single episode, unspecified: Secondary | ICD-10-CM | POA: Diagnosis not present

## 2017-03-30 DIAGNOSIS — Z6821 Body mass index (BMI) 21.0-21.9, adult: Secondary | ICD-10-CM | POA: Diagnosis not present

## 2017-03-31 DIAGNOSIS — G20 Parkinson's disease: Secondary | ICD-10-CM | POA: Diagnosis not present

## 2017-03-31 DIAGNOSIS — Z6821 Body mass index (BMI) 21.0-21.9, adult: Secondary | ICD-10-CM | POA: Diagnosis not present

## 2017-03-31 DIAGNOSIS — F329 Major depressive disorder, single episode, unspecified: Secondary | ICD-10-CM | POA: Diagnosis not present

## 2017-03-31 DIAGNOSIS — R531 Weakness: Secondary | ICD-10-CM | POA: Diagnosis not present

## 2017-03-31 DIAGNOSIS — R634 Abnormal weight loss: Secondary | ICD-10-CM | POA: Diagnosis not present

## 2017-03-31 DIAGNOSIS — R1310 Dysphagia, unspecified: Secondary | ICD-10-CM | POA: Diagnosis not present

## 2017-04-01 DIAGNOSIS — Z6821 Body mass index (BMI) 21.0-21.9, adult: Secondary | ICD-10-CM | POA: Diagnosis not present

## 2017-04-01 DIAGNOSIS — R531 Weakness: Secondary | ICD-10-CM | POA: Diagnosis not present

## 2017-04-01 DIAGNOSIS — R634 Abnormal weight loss: Secondary | ICD-10-CM | POA: Diagnosis not present

## 2017-04-01 DIAGNOSIS — F329 Major depressive disorder, single episode, unspecified: Secondary | ICD-10-CM | POA: Diagnosis not present

## 2017-04-01 DIAGNOSIS — G20 Parkinson's disease: Secondary | ICD-10-CM | POA: Diagnosis not present

## 2017-04-01 DIAGNOSIS — R1310 Dysphagia, unspecified: Secondary | ICD-10-CM | POA: Diagnosis not present

## 2017-04-04 DIAGNOSIS — G20 Parkinson's disease: Secondary | ICD-10-CM | POA: Diagnosis not present

## 2017-04-04 DIAGNOSIS — R634 Abnormal weight loss: Secondary | ICD-10-CM | POA: Diagnosis not present

## 2017-04-04 DIAGNOSIS — R1310 Dysphagia, unspecified: Secondary | ICD-10-CM | POA: Diagnosis not present

## 2017-04-04 DIAGNOSIS — R531 Weakness: Secondary | ICD-10-CM | POA: Diagnosis not present

## 2017-04-04 DIAGNOSIS — Z6821 Body mass index (BMI) 21.0-21.9, adult: Secondary | ICD-10-CM | POA: Diagnosis not present

## 2017-04-04 DIAGNOSIS — F329 Major depressive disorder, single episode, unspecified: Secondary | ICD-10-CM | POA: Diagnosis not present

## 2017-04-05 DIAGNOSIS — R531 Weakness: Secondary | ICD-10-CM | POA: Diagnosis not present

## 2017-04-05 DIAGNOSIS — R1310 Dysphagia, unspecified: Secondary | ICD-10-CM | POA: Diagnosis not present

## 2017-04-05 DIAGNOSIS — F329 Major depressive disorder, single episode, unspecified: Secondary | ICD-10-CM | POA: Diagnosis not present

## 2017-04-05 DIAGNOSIS — Z6821 Body mass index (BMI) 21.0-21.9, adult: Secondary | ICD-10-CM | POA: Diagnosis not present

## 2017-04-05 DIAGNOSIS — R634 Abnormal weight loss: Secondary | ICD-10-CM | POA: Diagnosis not present

## 2017-04-05 DIAGNOSIS — G20 Parkinson's disease: Secondary | ICD-10-CM | POA: Diagnosis not present

## 2017-04-06 DIAGNOSIS — F329 Major depressive disorder, single episode, unspecified: Secondary | ICD-10-CM | POA: Diagnosis not present

## 2017-04-06 DIAGNOSIS — Z6821 Body mass index (BMI) 21.0-21.9, adult: Secondary | ICD-10-CM | POA: Diagnosis not present

## 2017-04-06 DIAGNOSIS — G20 Parkinson's disease: Secondary | ICD-10-CM | POA: Diagnosis not present

## 2017-04-06 DIAGNOSIS — R1310 Dysphagia, unspecified: Secondary | ICD-10-CM | POA: Diagnosis not present

## 2017-04-06 DIAGNOSIS — R634 Abnormal weight loss: Secondary | ICD-10-CM | POA: Diagnosis not present

## 2017-04-06 DIAGNOSIS — R531 Weakness: Secondary | ICD-10-CM | POA: Diagnosis not present

## 2017-04-07 DIAGNOSIS — R1310 Dysphagia, unspecified: Secondary | ICD-10-CM | POA: Diagnosis not present

## 2017-04-07 DIAGNOSIS — G20 Parkinson's disease: Secondary | ICD-10-CM | POA: Diagnosis not present

## 2017-04-07 DIAGNOSIS — R531 Weakness: Secondary | ICD-10-CM | POA: Diagnosis not present

## 2017-04-07 DIAGNOSIS — F329 Major depressive disorder, single episode, unspecified: Secondary | ICD-10-CM | POA: Diagnosis not present

## 2017-04-07 DIAGNOSIS — Z6821 Body mass index (BMI) 21.0-21.9, adult: Secondary | ICD-10-CM | POA: Diagnosis not present

## 2017-04-07 DIAGNOSIS — R634 Abnormal weight loss: Secondary | ICD-10-CM | POA: Diagnosis not present

## 2017-04-08 DIAGNOSIS — G20 Parkinson's disease: Secondary | ICD-10-CM | POA: Diagnosis not present

## 2017-04-08 DIAGNOSIS — R531 Weakness: Secondary | ICD-10-CM | POA: Diagnosis not present

## 2017-04-08 DIAGNOSIS — R634 Abnormal weight loss: Secondary | ICD-10-CM | POA: Diagnosis not present

## 2017-04-08 DIAGNOSIS — Z6821 Body mass index (BMI) 21.0-21.9, adult: Secondary | ICD-10-CM | POA: Diagnosis not present

## 2017-04-08 DIAGNOSIS — F329 Major depressive disorder, single episode, unspecified: Secondary | ICD-10-CM | POA: Diagnosis not present

## 2017-04-08 DIAGNOSIS — R1310 Dysphagia, unspecified: Secondary | ICD-10-CM | POA: Diagnosis not present

## 2017-04-11 DIAGNOSIS — G20 Parkinson's disease: Secondary | ICD-10-CM | POA: Diagnosis not present

## 2017-04-11 DIAGNOSIS — F329 Major depressive disorder, single episode, unspecified: Secondary | ICD-10-CM | POA: Diagnosis not present

## 2017-04-11 DIAGNOSIS — R531 Weakness: Secondary | ICD-10-CM | POA: Diagnosis not present

## 2017-04-11 DIAGNOSIS — R1310 Dysphagia, unspecified: Secondary | ICD-10-CM | POA: Diagnosis not present

## 2017-04-11 DIAGNOSIS — R634 Abnormal weight loss: Secondary | ICD-10-CM | POA: Diagnosis not present

## 2017-04-11 DIAGNOSIS — Z6821 Body mass index (BMI) 21.0-21.9, adult: Secondary | ICD-10-CM | POA: Diagnosis not present

## 2017-07-18 ENCOUNTER — Other Ambulatory Visit: Payer: Self-pay

## 2017-07-18 ENCOUNTER — Emergency Department: Payer: Medicare Other

## 2017-07-18 ENCOUNTER — Emergency Department
Admission: EM | Admit: 2017-07-18 | Discharge: 2017-07-19 | Disposition: A | Discharge status: Home / Self Care | Payer: Medicare Other | Attending: Emergency Medicine | Admitting: Emergency Medicine

## 2017-07-18 DIAGNOSIS — Z79899 Other long term (current) drug therapy: Secondary | ICD-10-CM | POA: Insufficient documentation

## 2017-07-18 DIAGNOSIS — I10 Essential (primary) hypertension: Secondary | ICD-10-CM | POA: Diagnosis not present

## 2017-07-18 DIAGNOSIS — W19XXXA Unspecified fall, initial encounter: Secondary | ICD-10-CM

## 2017-07-18 DIAGNOSIS — G20 Parkinson's disease: Secondary | ICD-10-CM | POA: Diagnosis not present

## 2017-07-18 DIAGNOSIS — S51012A Laceration without foreign body of left elbow, initial encounter: Secondary | ICD-10-CM | POA: Insufficient documentation

## 2017-07-18 DIAGNOSIS — Y999 Unspecified external cause status: Secondary | ICD-10-CM | POA: Diagnosis not present

## 2017-07-18 DIAGNOSIS — S0990XA Unspecified injury of head, initial encounter: Secondary | ICD-10-CM

## 2017-07-18 DIAGNOSIS — Y929 Unspecified place or not applicable: Secondary | ICD-10-CM | POA: Diagnosis not present

## 2017-07-18 DIAGNOSIS — Z23 Encounter for immunization: Secondary | ICD-10-CM | POA: Insufficient documentation

## 2017-07-18 DIAGNOSIS — S0001XA Abrasion of scalp, initial encounter: Secondary | ICD-10-CM | POA: Diagnosis not present

## 2017-07-18 DIAGNOSIS — W010XXA Fall on same level from slipping, tripping and stumbling without subsequent striking against object, initial encounter: Secondary | ICD-10-CM | POA: Insufficient documentation

## 2017-07-18 DIAGNOSIS — F0390 Unspecified dementia without behavioral disturbance: Secondary | ICD-10-CM | POA: Insufficient documentation

## 2017-07-18 DIAGNOSIS — E119 Type 2 diabetes mellitus without complications: Secondary | ICD-10-CM | POA: Diagnosis not present

## 2017-07-18 DIAGNOSIS — Y9389 Activity, other specified: Secondary | ICD-10-CM | POA: Insufficient documentation

## 2017-07-18 MED ORDER — TETANUS-DIPHTH-ACELL PERTUSSIS 5-2.5-18.5 LF-MCG/0.5 IM SUSP
0.5000 mL | Freq: Once | INTRAMUSCULAR | Status: AC
  Administered 2017-07-19: 0.5 mL via INTRAMUSCULAR
  Filled 2017-07-18: qty 0.5

## 2017-07-18 MED ORDER — ACETAMINOPHEN 500 MG PO TABS
1000.0000 mg | ORAL_TABLET | Freq: Once | ORAL | Status: AC
  Administered 2017-07-19: 1000 mg via ORAL
  Filled 2017-07-18: qty 2

## 2017-07-18 MED ORDER — BACITRACIN ZINC 500 UNIT/GM EX OINT
TOPICAL_OINTMENT | Freq: Once | CUTANEOUS | Status: AC
  Administered 2017-07-19: 1 via TOPICAL
  Filled 2017-07-18: qty 0.9

## 2017-07-18 NOTE — ED Provider Notes (Signed)
[facility] Emergency Department Provider Note  ____________________________________________  Time seen: Approximately 11:43 PM  I have reviewed the triage vital signs and the nursing notes.   HISTORY  Chief Complaint Fall and Headache   HPI Vanessa Montes is a 84 y.o. female with a history of dementia, Parkinson's disease, diabetes, hypertension, hyperlipidemia, carotid stenosis on Plavix who presents for evaluation after mechanical fall.  Nursing staff was in patient's room helping her roommate get up to go to the bathroom.  Patient decided that she needed to go as well.  She got up from the bed and tripped and fell.  She hit her head onto the ground.  No LOC.  She is complaining of mild sharp pain in the back of her head that has been constant since the fall and nonradiating.  Per EMS she was complaining of neck pain and lower back pain as well.  Patient denies neck pain or back pain during my evaluation.  She remembers falling.  Past Medical History:  Diagnosis Date  . Anemia   . Carotid artery occlusion   . Dementia   . Depression   . Diabetes mellitus without complication ([place])   . GERD (gastroesophageal reflux disease)   . HLD (hyperlipidemia)   . Hypertension   . Parkinson's disease ([place])   . PD (Parkinson's disease) ([place])   . Thyroid nodule     There are no active problems to display for this patient.   Past Surgical History:  Procedure Laterality Date  . ABDOMINAL HYSTERECTOMY    . APPENDECTOMY    . CAROTID ENDARTERECTOMY    . ESOPHAGOGASTRODUODENOSCOPY (EGD) WITH PROPOFOL N/A 03/03/2016   Procedure: ESOPHAGOGASTRODUODENOSCOPY (EGD) WITH PROPOFOL;  Surgeon: Manya Silvas, MD;  Location: [facility] ENDOSCOPY;  Service: Endoscopy;  Laterality: N/A;  w/ Dilation (+) Diabetes  . EYE SURGERY Right 11/09/2011  . JOINT REPLACEMENT    . JOINT REPLACEMENT Right 2014   knee  . PARATHYROIDECTOMY      Prior to Admission medications     Medication Sig Start Date End Date Taking? Authorizing Provider  acetaminophen (TYLENOL) 500 MG tablet Take 500 mg by mouth every 6 (six) hours as needed.    [provider]  amLODipine (NORVASC) 5 MG tablet Take 5 mg by mouth daily.    [provider]  Artificial Tear Solution (TEARS NATURALE II) SOLN Apply 1 drop to eye every 4 (four) hours as needed.    [provider]  azelastine (OPTIVAR) 0.05 % ophthalmic solution 1 drop 2 (two) times daily.    [provider]  carbidopa-levodopa (SINEMET IR) 25-100 MG tablet Take 1 tablet by mouth 4 (four) times daily.     [provider]  cephALEXin (KEFLEX) 500 MG capsule Take 1 capsule (500 mg total) by mouth 3 (three) times daily. 06/14/16   Paulette Blanch, MD  cholecalciferol (VITAMIN D) 400 units TABS tablet Take 400 Units by mouth daily.     [provider]  clopidogrel (PLAVIX) 75 MG tablet Take 75 mg by mouth daily.    [provider]  clotrimazole-betamethasone (LOTRISONE) cream Apply 1 application topically 2 (two) times daily.    [provider]  dextrose (GLUTOSE) 40 % GEL Take 1 Tube by mouth once as needed for low blood sugar.    [provider]  docusate sodium (COLACE) 100 MG capsule Take 100 mg by mouth daily.     [provider]  donepezil (ARICEPT) 5 MG tablet  Take 5 mg by mouth at bedtime.    [provider]  escitalopram (LEXAPRO) 20 MG tablet Take 20 mg by mouth daily.     [provider]  Fe Cbn-Fe Gluc-FA-B12-C-DSS (FERRALET 90 PO) Take by mouth daily.    [provider]  guaiFENesin (MUCINEX) 600 MG 12 hr tablet Take 600 mg by mouth 2 (two) times daily.    [provider]  ipratropium (ATROVENT) 0.03 % nasal spray Place 2 sprays into both nostrils every 12 (twelve) hours.    [provider]  levocetirizine (XYZAL) 5 MG tablet Take 5 mg by mouth every evening.    [provider]  loperamide  (IMODIUM) 2 MG capsule Take 2 mg by mouth daily as needed for diarrhea or loose stools.    [provider]  methimazole (TAPAZOLE) 5 MG tablet Take 0.5 mg by mouth daily. 05/21/16 05/21/17  [provider]  pantoprazole (PROTONIX) 40 MG tablet Take 40 mg by mouth daily.    [provider]  Probiotic Product (ALIGN) 4 MG CAPS Take 4 mg by mouth daily.    [provider]  traMADol (ULTRAM) 50 MG tablet Take 50 mg by mouth 3 (three) times daily.     [provider]    Allergies Sulfa antibiotics  No family history on file.  Social History Social History   Tobacco Use  . Smoking status: Never Smoker  . Smokeless tobacco: Never Used  Substance Use Topics  . Alcohol use: No  . Drug use: No    Review of Systems Constitutional: Negative for fever. Eyes: Negative for visual changes. ENT: Negative for facial injury or neck injury Cardiovascular: Negative for chest injury. Respiratory: Negative for shortness of breath. Negative for chest wall injury. Gastrointestinal: Negative for abdominal pain or injury. Genitourinary: Negative for dysuria. Musculoskeletal: Negative for back injury, negative for arm or leg pain. Skin: + Occipital abrasion and L elbow skin tear Neurological: + head injury.   ____________________________________________   PHYSICAL EXAM:  VITAL SIGNS: ED Triage Vitals  Enc Vitals Group     BP 07/18/17 2149 (!) 167/55     Pulse Rate 07/18/17 2149 66     Resp 07/18/17 2149 16     Temp 07/18/17 2149 97.8 F (36.6 C)     Temp Source 07/18/17 2149 Oral     SpO2 07/18/17 2149 98 %     Weight 07/18/17 2150 105 lb (47.6 kg)     Height 07/18/17 2150 5\' 2"  (1.575 m)     Head Circumference --      Peak Flow --      Pain Score 07/18/17 2149 10     Pain Loc --      Pain Edu? --      Excl. in [place]? --    Full spinal precautions maintained throughout the trauma exam. Constitutional: Alert and oriented. No acute distress. Does  not appear intoxicated. HEENT Head: Normocephalic and atraumatic. Abrasion to the occipital scalp, no lacerations Face: No facial bony tenderness. Stable midface Ears: No hemotympanum bilaterally. No Battle sign Eyes: No eye injury. PERRL. No raccoon eyes Nose: Nontender. No epistaxis. No rhinorrhea Mouth/Throat: Mucous membranes are moist. No oropharyngeal blood. No dental injury. Airway patent without stridor. Normal voice. Neck: C-collar in place. No midline c-spine tenderness.  Cardiovascular: Normal rate, regular rhythm. Normal and symmetric distal pulses are present in all extremities. Pulmonary/Chest: Chest wall is stable and nontender to palpation/compression. Normal respiratory effort. Breath sounds are  normal. No crepitus.  Abdominal: Soft, nontender, non distended. Musculoskeletal: Nontender with normal full range of motion in all extremities. No deformities. No thoracic or lumbar midline spinal tenderness. Pelvis is stable. Skin: Skin is warm, dry and intact. No abrasions or contutions. Psychiatric: Speech and behavior are appropriate. Neurological: Normal speech and language. Moves all extremities to command. No gross focal neurologic deficits are appreciated.  Glascow Coma Score: 4 - Opens eyes on own 6 - Follows simple motor commands 5 - Alert and oriented GCS: 15  ____________________________________________   LABS (all labs ordered are listed, but only abnormal results are displayed)  Labs Reviewed - No data to display ____________________________________________  EKG  none  ____________________________________________  RADIOLOGY  I have personally reviewed the images performed during this visit and I agree with the Radiologist's read.   Interpretation by Radiologist:  Dg Lumbar Spine Complete  Result Date: 07/18/2017 CLINICAL DATA:  Low back pain after fall EXAM: LUMBAR SPINE - COMPLETE 4+ VIEW COMPARISON:  CT abdomen pelvis from 01/14/2010. Reformats were  not provided from this exam however. Lateral chest radiograph from 05/27/2016 FINDINGS: Normal lumbar segmentation and lordosis. New 25% compression deformity of the superior endplate of L3. Minimal grade 1 anterolisthesis likely mediated by facet arthropathy and degenerative disc disease of L3 on L4. Diffuse facet sclerosis and hypertrophy from L2 through S1 with mild L4-5 and L5-S1 neural foraminal encroachment. No pars defects. IMPRESSION: 1. Lumbar spondylosis with multilevel degenerative facet arthropathy. 2. 25% compression fracture of the superior endplate of L3 without retropulsion, new since 2011 CT and possibly from the lateral chest radiograph from 05/27/2016 though this vertebral body is incompletely included on the lateral chest radiograph. Electronically Signed   By: Ashley Royalty M.D.   On: 07/18/2017 23:05   Ct Head Wo Contrast  Result Date: 07/18/2017 CLINICAL DATA:  Initial evaluation for acute trauma, fall. EXAM: CT HEAD WITHOUT CONTRAST CT CERVICAL SPINE WITHOUT CONTRAST TECHNIQUE: Multidetector CT imaging of the head and cervical spine was performed following the standard protocol without intravenous contrast. Multiplanar CT image reconstructions of the cervical spine were also generated. COMPARISON:  None. FINDINGS: CT HEAD FINDINGS Brain: Moderate cerebral atrophy with chronic small vessel ischemic disease. No acute intracranial hemorrhage. No acute large vessel territory infarct. No mass lesion, midline shift or mass effect. Diffuse ventricular prominence related global parenchymal volume loss of hydrocephalus. No extra-axial fluid collection. Vascular: No hyperdense vessel. Calcified atherosclerosis present at the skull base. Skull: Small left occipital scalp contusion with laceration. Calvarium intact. Sinuses/Orbits: Globes and orbital soft tissues demonstrate no acute abnormality. Chronic mucosal thickening present within the ethmoidal air cells. Paranasal sinuses are otherwise clear. No  mastoid effusion. Other: None. CT CERVICAL SPINE FINDINGS Alignment: Straightening of the normal cervical lordosis. No listhesis. Skull base and vertebrae: Skull base intact. Normal C1-2 articulations are preserved in the dens is intact. Vertebral body heights maintained. No acute fracture. Soft tissues and spinal canal: No acute soft tissue abnormality within the neck. No abnormal prevertebral edema. Thyroid diffusely enlarged and heterogeneous, like related to goiter. Disc levels: Moderate to advanced multilevel degenerate spondylolysis at C3-4 through T1-2. Prominent bulky anterior osteophytic spurring at these levels. Upper chest: Visualized upper chest demonstrates no acute abnormality. Partially visualized lungs are grossly clear. Other: None. IMPRESSION: CT BRAIN: 1. No acute intracranial process. 2. Small left occipital scalp contusion with laceration. No calvarial fracture. 3. Advanced age-related cerebral atrophy with chronic small vessel ischemic disease. CT CERVICAL SPINE: 1. No acute traumatic injury  within the cervical spine. 2. Advanced degenerative spondylolysis at C3-4 through T1-2. Electronically Signed   By: Jeannine Boga M.D.   On: 07/18/2017 23:26   Ct Cervical Spine Wo Contrast  Result Date: 07/18/2017 CLINICAL DATA:  Initial evaluation for acute trauma, fall. EXAM: CT HEAD WITHOUT CONTRAST CT CERVICAL SPINE WITHOUT CONTRAST TECHNIQUE: Multidetector CT imaging of the head and cervical spine was performed following the standard protocol without intravenous contrast. Multiplanar CT image reconstructions of the cervical spine were also generated. COMPARISON:  None. FINDINGS: CT HEAD FINDINGS Brain: Moderate cerebral atrophy with chronic small vessel ischemic disease. No acute intracranial hemorrhage. No acute large vessel territory infarct. No mass lesion, midline shift or mass effect. Diffuse ventricular prominence related global parenchymal volume loss of hydrocephalus. No extra-axial  fluid collection. Vascular: No hyperdense vessel. Calcified atherosclerosis present at the skull base. Skull: Small left occipital scalp contusion with laceration. Calvarium intact. Sinuses/Orbits: Globes and orbital soft tissues demonstrate no acute abnormality. Chronic mucosal thickening present within the ethmoidal air cells. Paranasal sinuses are otherwise clear. No mastoid effusion. Other: None. CT CERVICAL SPINE FINDINGS Alignment: Straightening of the normal cervical lordosis. No listhesis. Skull base and vertebrae: Skull base intact. Normal C1-2 articulations are preserved in the dens is intact. Vertebral body heights maintained. No acute fracture. Soft tissues and spinal canal: No acute soft tissue abnormality within the neck. No abnormal prevertebral edema. Thyroid diffusely enlarged and heterogeneous, like related to goiter. Disc levels: Moderate to advanced multilevel degenerate spondylolysis at C3-4 through T1-2. Prominent bulky anterior osteophytic spurring at these levels. Upper chest: Visualized upper chest demonstrates no acute abnormality. Partially visualized lungs are grossly clear. Other: None. IMPRESSION: CT BRAIN: 1. No acute intracranial process. 2. Small left occipital scalp contusion with laceration. No calvarial fracture. 3. Advanced age-related cerebral atrophy with chronic small vessel ischemic disease. CT CERVICAL SPINE: 1. No acute traumatic injury within the cervical spine. 2. Advanced degenerative spondylolysis at C3-4 through T1-2. Electronically Signed   By: Jeannine Boga M.D.   On: 07/18/2017 23:26     ____________________________________________   PROCEDURES  Procedure(s) performed: None Procedures Critical Care performed:  None ____________________________________________   INITIAL IMPRESSION / ASSESSMENT AND PLAN / ED COURSE  84 y.o. female with a history of dementia, Parkinson's disease, diabetes, hypertension, hyperlipidemia, carotid stenosis on Plavix  who presents for evaluation after mechanical fall.  Patient is at her baseline according to family.  She has a abrasion to the occipital scalp, no midline CT and L-spine tenderness, skin tear on the left elbow.  CT head and neck negative for acute fractures. C-spine was cleared by myself. Patient initially was complaining of low back pain therefore a x-ray of the lumbar spine was done which showed a indeterminate age of 25% compression fracture of the L3.  Patient has no tenderness to palpation of the lumbar spine and therefore I do not think this is acute.  Tetanus was updated. Tylenol given for pain. Plan to dc back to SNF.  Discussed return precautions for delayed head bleed with family members.      As part of my medical decision making, I reviewed the following data within the [place] notes reviewed and incorporated, Radiograph reviewed , Notes from prior ED visits and [place] Controlled Substance Database    Pertinent labs & imaging results that were available during my care of the patient were reviewed by me and considered in my medical decision making (see chart for details).  ____________________________________________   FINAL CLINICAL IMPRESSION(S) / ED DIAGNOSES  Final diagnoses:  Fall, initial encounter  Injury of head, initial encounter  Abrasion of scalp, initial encounter  Skin tear of left elbow without complication, initial encounter      NEW MEDICATIONS STARTED DURING THIS VISIT:  ED Discharge Orders    None       Note:  This document was prepared using Dragon voice recognition software and may include unintentional dictation errors.    Alfred Levins, [place], MD 07/18/17 [phone]

## 2017-07-18 NOTE — ED Triage Notes (Signed)
Pt arrived via ems from [facility] - she was in the room with staff when she got up unassisted and fell hitting the back of her head on a dresser - she has a small lac to back of head and a skin tear to left arm - pt c/o 10/10 pain in head

## 2017-07-18 NOTE — Discharge Instructions (Addendum)

## 2017-07-18 NOTE — ED Notes (Addendum)
Patient transported to CT 

## 2017-07-19 DIAGNOSIS — S0001XA Abrasion of scalp, initial encounter: Secondary | ICD-10-CM | POA: Diagnosis not present

## 2017-07-19 NOTE — ED Notes (Signed)
Daughter transported pt back to [facility] via [facility]. Hard copy of discharge signed by daughter. Report called to the [facility], spoke to [place].

## 2017-11-10 DEATH — deceased

## 2018-10-02 IMAGING — MR MR HEAD W/O CM
10 series · 48 of 48 positions shown · non-contrast
Comparison: CT head 01/12/2016

CLINICAL DATA: Dysphagia.  Slurred speech, weakness, headache

EXAM:
MRI HEAD WITHOUT CONTRAST
TECHNIQUE: Multiplanar, multiecho pulse sequences of the brain and surrounding
structures were obtained without intravenous contrast.

[Series 2: T1 · sagittal · 5.0mm · 0.45mm/px · 3 of 23 slices shown (1 of 2)]
[im 1/23]
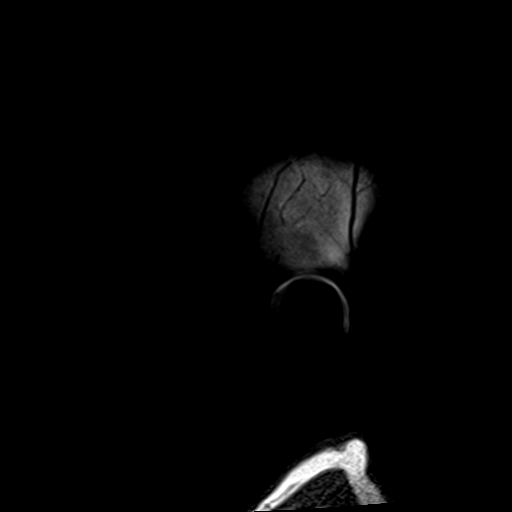
[im 12/23]
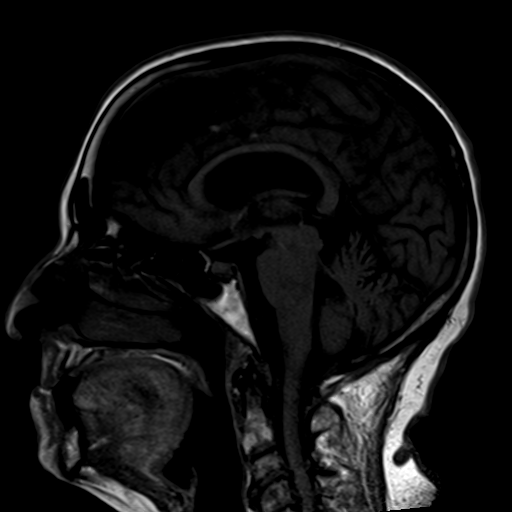
[im 23/23]
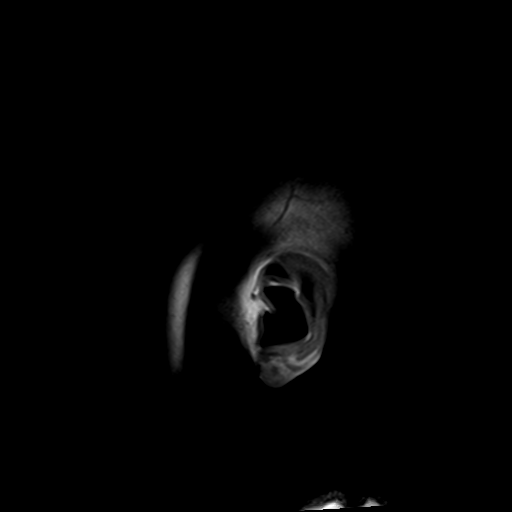

[Series 4: DWI · axial · 3.0mm · 1.20mm/px · z∈[-83,+82]mm · 6 of 44 slices shown (1 of 3)]
[im 1/44]
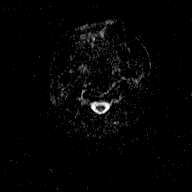
[im 9/44]
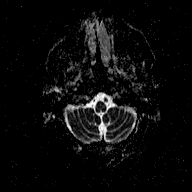
[im 18/44]
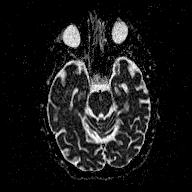
[im 26/44]
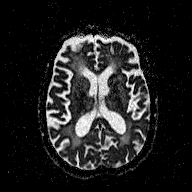
[im 35/44]
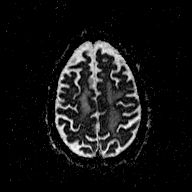
[im 44/44]
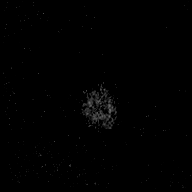

[Series 7: T2 · axial · 5.0mm · 0.90mm/px · z∈[-80,+80]mm · 3 of 26 slices shown (1 of 3)]
[im 1/26]
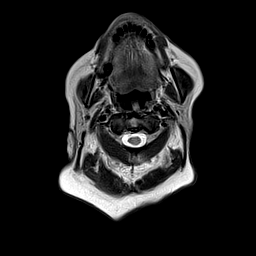
[im 13/26]
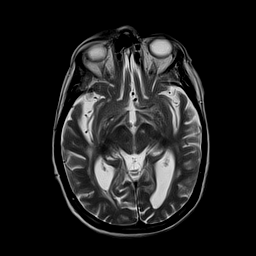
[im 26/26]
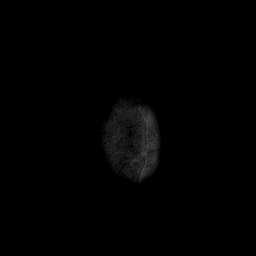

[Series 8: FLAIR · axial · 5.0mm · 0.45mm/px · z∈[-78,+82]mm · 3 of 26 slices shown]
[im 1/26]
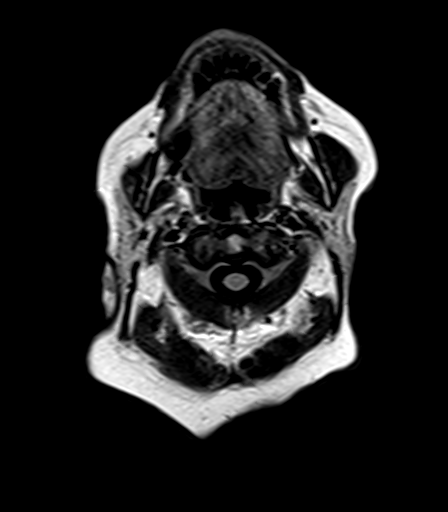
[im 13/26]
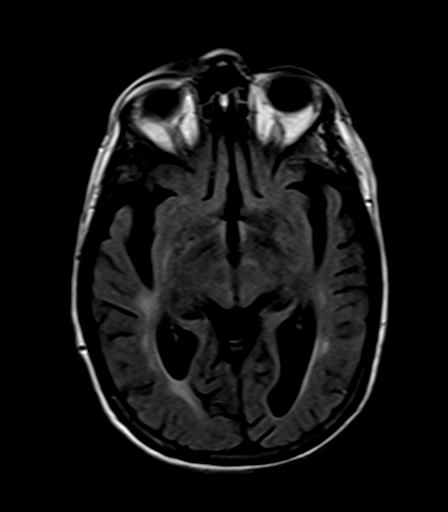
[im 26/26]
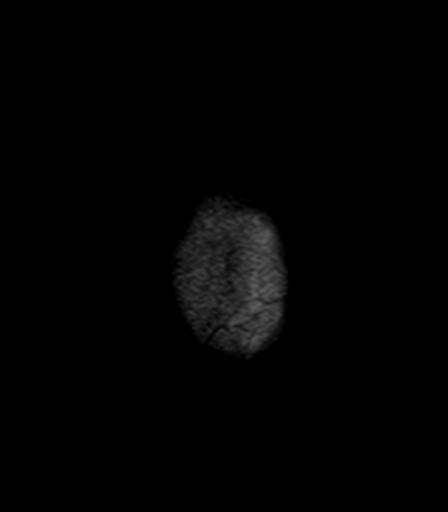

[Series 9: T2 · axial · 5.0mm · 0.72mm/px · z∈[-75,+85]mm · 3 of 26 slices shown (2 of 3)]
[im 1/26]
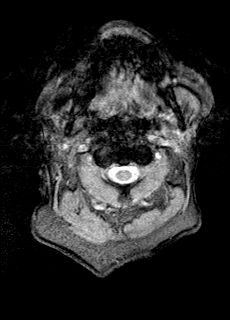
[im 13/26]
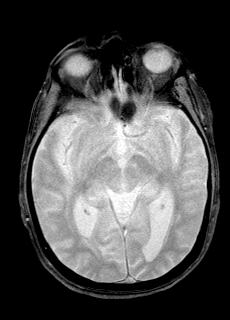
[im 26/26]
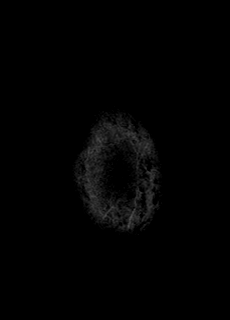

[Series 10: T1 · axial · 3.0mm · 1.00mm/px · z∈[-82,+93]mm · 8 of 60 slices shown (2 of 2)]
[im 1/60]
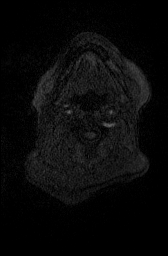
[im 9/60]
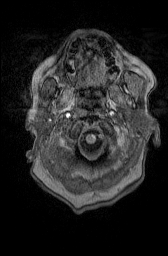
[im 17/60]
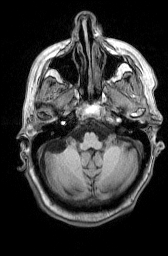
[im 26/60]
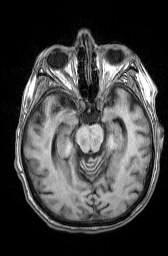
[im 34/60]
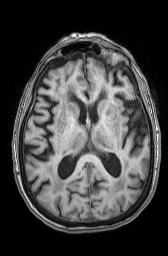
[im 43/60]
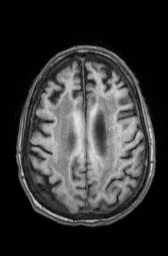
[im 51/60]
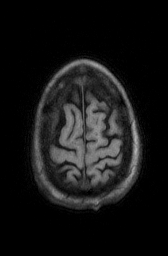
[im 60/60]
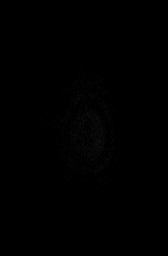

[Series 11: T2 · coronal · 5.0mm · 0.45mm/px · 4 of 29 slices shown (3 of 3)]
[im 1/29]
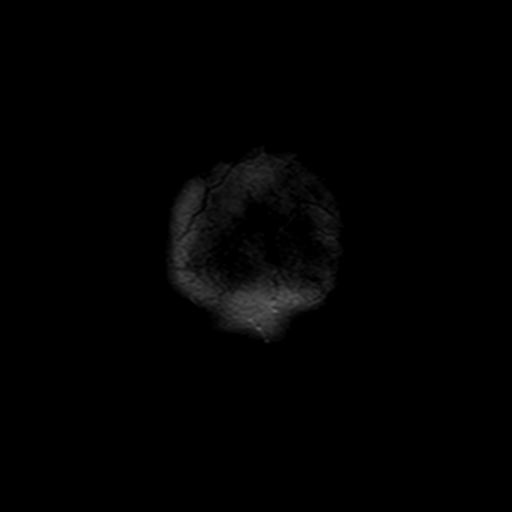
[im 10/29]
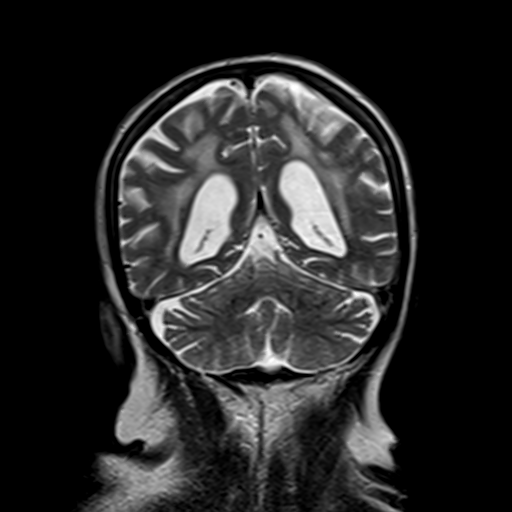
[im 19/29]
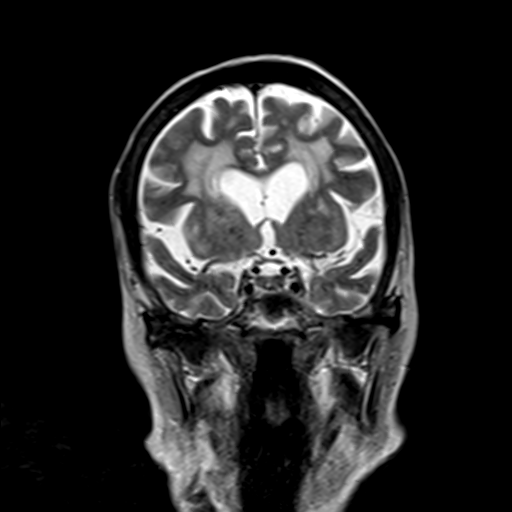
[im 29/29]
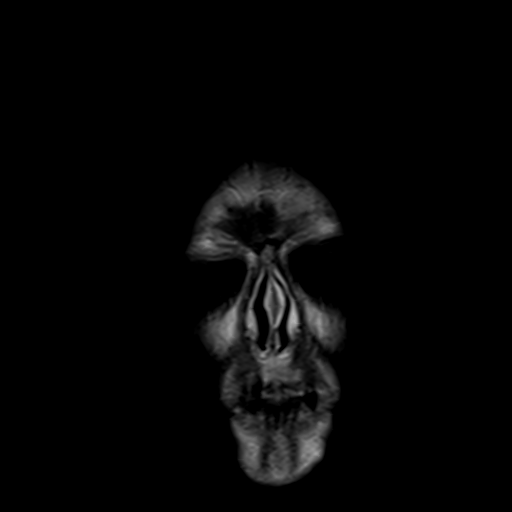

[Series 100: DWI · axial · 3.0mm · 1.20mm/px · z∈[-83,+82]mm · 6 of 44 slices shown (2 of 3)]
[im 1/44]
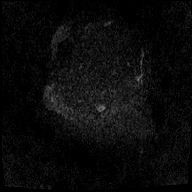
[im 9/44]
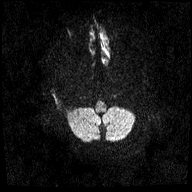
[im 18/44]
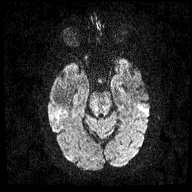
[im 26/44]
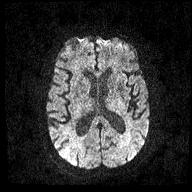
[im 35/44]
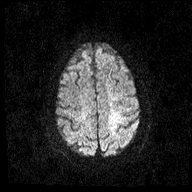
[im 44/44]
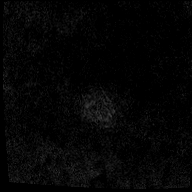

[Series 102: DWI · coronal · 3.0mm · 1.20mm/px · 6 of 45 slices shown (3 of 3)]
[im 1/45]
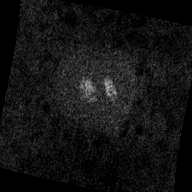
[im 9/45]
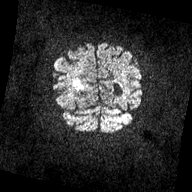
[im 18/45]
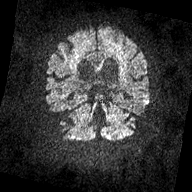
[im 27/45]
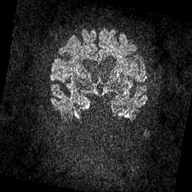
[im 36/45]
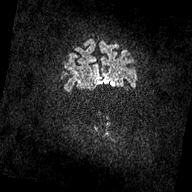
[im 45/45]
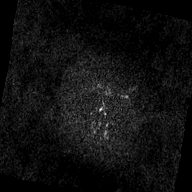

[Series 103: ADC · coronal · 3.0mm · 1.20mm/px · 6 of 45 slices shown]
[im 1/45]
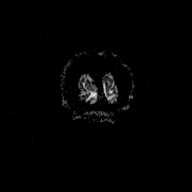
[im 9/45]
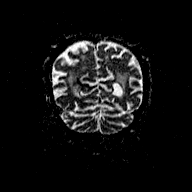
[im 18/45]
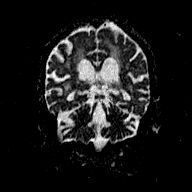
[im 27/45]
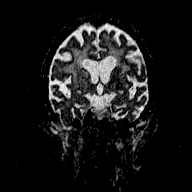
[im 36/45]
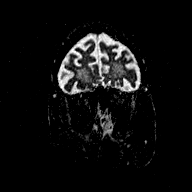
[im 45/45]
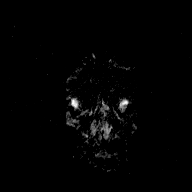

[48 of 48 positions shown; findings below may reference images not displayed]

FINDINGS: Brain: Moderate to advanced atrophy. Advanced chronic microvascular
ischemic changes in the white matter, basal ganglia, pons.

Negative for acute infarct. Negative for hemorrhage or mass. No
shift of the midline structures.

Vascular: Normal arterial flow voids.

Skull and upper cervical spine: Cervical spondylosis and spinal
stenosis at C3-4 and C4-5 as noted on cervical spine CT 01/12/2016.
No worrisome skull lesion.

Sinuses/Orbits: Mild mucosal edema paranasal sinuses. Right cataract
extraction.

Other: None
IMPRESSION: Extensive atrophy and chronic ischemic change. No acute intracranial
abnormality.
